# Patient Record
Sex: Male | Born: 1950 | Race: White | Hispanic: No | Marital: Married | Smoking: Former smoker
Health system: Southern US, Community
[De-identification: ages and names within clinical notes are randomized; demographics above are authoritative.]

## PROBLEM LIST (undated history)

## (undated) DIAGNOSIS — Z8679 Personal history of other diseases of the circulatory system: Secondary | ICD-10-CM

## (undated) DIAGNOSIS — G4733 Obstructive sleep apnea (adult) (pediatric): Secondary | ICD-10-CM

## (undated) DIAGNOSIS — C801 Malignant (primary) neoplasm, unspecified: Secondary | ICD-10-CM

## (undated) DIAGNOSIS — Z8249 Family history of ischemic heart disease and other diseases of the circulatory system: Secondary | ICD-10-CM

## (undated) DIAGNOSIS — Z972 Presence of dental prosthetic device (complete) (partial): Secondary | ICD-10-CM

## (undated) DIAGNOSIS — T4145XA Adverse effect of unspecified anesthetic, initial encounter: Secondary | ICD-10-CM

## (undated) DIAGNOSIS — T8859XA Other complications of anesthesia, initial encounter: Secondary | ICD-10-CM

## (undated) DIAGNOSIS — K219 Gastro-esophageal reflux disease without esophagitis: Secondary | ICD-10-CM

## (undated) DIAGNOSIS — I480 Paroxysmal atrial fibrillation: Secondary | ICD-10-CM

## (undated) DIAGNOSIS — M1990 Unspecified osteoarthritis, unspecified site: Secondary | ICD-10-CM

## (undated) DIAGNOSIS — I2510 Atherosclerotic heart disease of native coronary artery without angina pectoris: Secondary | ICD-10-CM

## (undated) DIAGNOSIS — Z974 Presence of external hearing-aid: Secondary | ICD-10-CM

## (undated) DIAGNOSIS — R55 Syncope and collapse: Secondary | ICD-10-CM

## (undated) DIAGNOSIS — I8393 Asymptomatic varicose veins of bilateral lower extremities: Secondary | ICD-10-CM

## (undated) DIAGNOSIS — Z9884 Bariatric surgery status: Secondary | ICD-10-CM

## (undated) HISTORY — PX: ELBOW SURGERY: SHX618

## (undated) HISTORY — DX: Family history of ischemic heart disease and other diseases of the circulatory system: Z82.49

## (undated) HISTORY — DX: Syncope and collapse: R55

## (undated) HISTORY — DX: Gastro-esophageal reflux disease without esophagitis: K21.9

## (undated) HISTORY — DX: Asymptomatic varicose veins of bilateral lower extremities: I83.93

## (undated) HISTORY — PX: HERNIA REPAIR: SHX51

## (undated) HISTORY — DX: Paroxysmal atrial fibrillation: I48.0

## (undated) HISTORY — PX: LAPAROSCOPIC GASTRIC SLEEVE RESECTION: SHX5895

## (undated) HISTORY — DX: Personal history of other diseases of the circulatory system: Z86.79

## (undated) HISTORY — PX: CHOLECYSTECTOMY: SHX55

## (undated) HISTORY — PX: ROTATOR CUFF REPAIR: SHX139

## (undated) HISTORY — DX: Atherosclerotic heart disease of native coronary artery without angina pectoris: I25.10

## (undated) HISTORY — DX: Bariatric surgery status: Z98.84

## (undated) HISTORY — DX: Obstructive sleep apnea (adult) (pediatric): G47.33

## (undated) HISTORY — PX: OTHER SURGICAL HISTORY: SHX169

---

## 2000-11-23 DIAGNOSIS — I480 Paroxysmal atrial fibrillation: Secondary | ICD-10-CM

## 2000-11-23 HISTORY — DX: Paroxysmal atrial fibrillation: I48.0

## 2006-11-23 HISTORY — PX: CARDIAC CATHETERIZATION: SHX172

## 2007-05-24 DIAGNOSIS — Z8679 Personal history of other diseases of the circulatory system: Secondary | ICD-10-CM | POA: Insufficient documentation

## 2007-05-24 DIAGNOSIS — I2510 Atherosclerotic heart disease of native coronary artery without angina pectoris: Secondary | ICD-10-CM

## 2007-05-24 HISTORY — DX: Personal history of other diseases of the circulatory system: Z86.79

## 2007-05-24 HISTORY — DX: Atherosclerotic heart disease of native coronary artery without angina pectoris: I25.10

## 2011-02-12 ENCOUNTER — Observation Stay: Payer: Self-pay | Admitting: Internal Medicine

## 2011-02-12 DIAGNOSIS — R079 Chest pain, unspecified: Secondary | ICD-10-CM

## 2012-04-11 DIAGNOSIS — E291 Testicular hypofunction: Secondary | ICD-10-CM | POA: Insufficient documentation

## 2012-11-17 DIAGNOSIS — I872 Venous insufficiency (chronic) (peripheral): Secondary | ICD-10-CM | POA: Insufficient documentation

## 2013-03-09 ENCOUNTER — Ambulatory Visit: Payer: Self-pay | Admitting: Gastroenterology

## 2013-03-10 DIAGNOSIS — N401 Enlarged prostate with lower urinary tract symptoms: Secondary | ICD-10-CM | POA: Insufficient documentation

## 2013-05-24 DIAGNOSIS — M47816 Spondylosis without myelopathy or radiculopathy, lumbar region: Secondary | ICD-10-CM | POA: Insufficient documentation

## 2013-05-30 ENCOUNTER — Encounter: Payer: Self-pay | Admitting: Orthopedic Surgery

## 2014-03-23 DIAGNOSIS — R55 Syncope and collapse: Secondary | ICD-10-CM

## 2014-03-23 HISTORY — DX: Syncope and collapse: R55

## 2014-03-23 HISTORY — PX: OTHER SURGICAL HISTORY: SHX169

## 2014-03-23 HISTORY — PX: TRANSTHORACIC ECHOCARDIOGRAM: SHX275

## 2014-04-06 DIAGNOSIS — R002 Palpitations: Secondary | ICD-10-CM | POA: Insufficient documentation

## 2014-04-26 DIAGNOSIS — H903 Sensorineural hearing loss, bilateral: Secondary | ICD-10-CM | POA: Insufficient documentation

## 2014-04-26 DIAGNOSIS — H905 Unspecified sensorineural hearing loss: Secondary | ICD-10-CM | POA: Insufficient documentation

## 2014-06-26 ENCOUNTER — Inpatient Hospital Stay: Payer: Self-pay | Admitting: Internal Medicine

## 2014-06-26 LAB — COMPREHENSIVE METABOLIC PANEL
AST: 29 U/L (ref 15–37)
Albumin: 3.5 g/dL (ref 3.4–5.0)
Alkaline Phosphatase: 101 U/L
Anion Gap: 9 (ref 7–16)
BILIRUBIN TOTAL: 0.3 mg/dL (ref 0.2–1.0)
BUN: 28 mg/dL — AB (ref 7–18)
CALCIUM: 8.6 mg/dL (ref 8.5–10.1)
Chloride: 107 mmol/L (ref 98–107)
Co2: 25 mmol/L (ref 21–32)
Creatinine: 0.97 mg/dL (ref 0.60–1.30)
EGFR (Non-African Amer.): >60
GLUCOSE: 104 mg/dL — AB (ref 65–99)
Osmolality: 287 (ref 275–301)
POTASSIUM: 4.1 mmol/L (ref 3.5–5.1)
SGPT (ALT): 36 U/L
Sodium: 141 mmol/L (ref 136–145)
Total Protein: 7.2 g/dL (ref 6.4–8.2)

## 2014-06-26 LAB — URINALYSIS, COMPLETE
Bacteria: NONE SEEN
Bilirubin,UR: NEGATIVE
Blood: NEGATIVE
Glucose,UR: NEGATIVE mg/dL (ref 0–75)
Ketone: NEGATIVE
Leukocyte Esterase: NEGATIVE
NITRITE: NEGATIVE
PH: 6 (ref 4.5–8.0)
Protein: NEGATIVE
RBC, UR: NONE SEEN /HPF (ref 0–5)
SQUAMOUS EPITHELIAL: NONE SEEN
Specific Gravity: 1.016 (ref 1.003–1.030)
WBC UR: NONE SEEN /HPF (ref 0–5)

## 2014-06-26 LAB — CBC WITH DIFFERENTIAL/PLATELET
BASOS ABS: 0.1 10*3/uL (ref 0.0–0.1)
BASOS PCT: 0.8 %
EOS ABS: 0.1 10*3/uL (ref 0.0–0.7)
EOS PCT: 1.2 %
HCT: 44.7 % (ref 40.0–52.0)
HGB: 14.9 g/dL (ref 13.0–18.0)
LYMPHS ABS: 1.6 10*3/uL (ref 1.0–3.6)
Lymphocyte %: 21 %
MCH: 30 pg (ref 26.0–34.0)
MCHC: 33.3 g/dL (ref 32.0–36.0)
MCV: 90 fL (ref 80–100)
MONO ABS: 0.9 x10 3/mm (ref 0.2–1.0)
MONOS PCT: 11.5 %
NEUTROS ABS: 5 10*3/uL (ref 1.4–6.5)
NEUTROS PCT: 65.5 %
Platelet: 251 10*3/uL (ref 150–440)
RBC: 4.98 10*6/uL (ref 4.40–5.90)
RDW: 14.1 % (ref 11.5–14.5)
WBC: 7.7 10*3/uL (ref 3.8–10.6)

## 2014-06-26 LAB — PROTIME-INR
INR: 1
PROTHROMBIN TIME: 12.8 s (ref 11.5–14.7)

## 2014-06-26 LAB — DRUG SCREEN, URINE

## 2014-06-26 LAB — CK-MB: CK-MB: 3.6 ng/mL (ref 0.5–3.6)

## 2014-06-26 LAB — MAGNESIUM: MAGNESIUM: 1.8 mg/dL

## 2014-06-26 LAB — APTT: Activated PTT: 38.7 secs — ABNORMAL HIGH (ref 23.6–35.9)

## 2014-06-26 LAB — CK TOTAL AND CKMB (NOT AT ARMC)
CK, Total: 294 U/L
CK-MB: 4.8 ng/mL — AB (ref 0.5–3.6)

## 2014-06-26 LAB — TROPONIN I
Troponin-I: <0.02 ng/mL
Troponin-I: <0.02 ng/mL

## 2014-06-26 LAB — ETHANOL: Ethanol %: <0.003 % (ref 0.000–0.080)

## 2014-06-26 LAB — LIPASE, BLOOD: Lipase: 271 U/L (ref 73–393)

## 2014-06-26 LAB — TSH: Thyroid Stimulating Horm: 2.38 u[IU]/mL

## 2014-06-27 ENCOUNTER — Other Ambulatory Visit: Payer: Self-pay | Admitting: Nurse Practitioner

## 2014-06-27 DIAGNOSIS — R079 Chest pain, unspecified: Secondary | ICD-10-CM

## 2014-06-27 DIAGNOSIS — I4891 Unspecified atrial fibrillation: Secondary | ICD-10-CM

## 2014-06-27 LAB — LIPID PANEL
Cholesterol: 123 mg/dL (ref 0–200)
HDL: 42 mg/dL (ref 40–60)
LDL CHOLESTEROL, CALC: 56 mg/dL (ref 0–100)
Triglycerides: 126 mg/dL (ref 0–200)
VLDL CHOLESTEROL, CALC: 25 mg/dL (ref 5–40)

## 2014-06-27 LAB — CK-MB: CK-MB: 3 ng/mL (ref 0.5–3.6)

## 2014-06-27 LAB — TROPONIN I

## 2014-06-28 DIAGNOSIS — I4891 Unspecified atrial fibrillation: Secondary | ICD-10-CM

## 2014-06-28 DIAGNOSIS — R079 Chest pain, unspecified: Secondary | ICD-10-CM

## 2014-06-28 HISTORY — PX: NM MYOVIEW LTD: HXRAD82

## 2014-06-28 LAB — BASIC METABOLIC PANEL
Anion Gap: 5 — ABNORMAL LOW (ref 7–16)
BUN: 19 mg/dL — ABNORMAL HIGH (ref 7–18)
Calcium, Total: 8.8 mg/dL (ref 8.5–10.1)
Chloride: 106 mmol/L (ref 98–107)
Co2: 29 mmol/L (ref 21–32)
Creatinine: 0.98 mg/dL (ref 0.60–1.30)
EGFR (African American): >60
EGFR (Non-African Amer.): >60
Glucose: 91 mg/dL (ref 65–99)
Osmolality: 281 (ref 275–301)
Potassium: 4.3 mmol/L (ref 3.5–5.1)
Sodium: 140 mmol/L (ref 136–145)

## 2014-06-28 LAB — MAGNESIUM: MAGNESIUM: 1.6 mg/dL — AB

## 2014-07-03 ENCOUNTER — Telehealth: Payer: Self-pay | Admitting: *Deleted

## 2014-07-03 ENCOUNTER — Encounter: Payer: Self-pay | Admitting: *Deleted

## 2014-07-03 ENCOUNTER — Telehealth: Payer: Self-pay

## 2014-07-03 NOTE — Telephone Encounter (Signed)
Patient contacted regarding discharge from [facility] on 07/02/14.  Patient understands to follow up with provider Ellyn Hack on 07/11/14 at 2:15pm at [facility]. Patient understands discharge instructions? yes Patient understands medications and regiment? yes Patient understands to bring all medications to this visit? yes  Reviewed meds

## 2014-07-03 NOTE — Telephone Encounter (Signed)
Patient contacted regarding discharge from [facility] on 06/29/14.  Patient understands to follow up with  on 07/11/14 at 07/11/14 at [facility]. Patient understands discharge instructions? Yes  Patient understands medications and regiment? yes Patient understands to bring all medications to this visit? yes

## 2014-07-11 ENCOUNTER — Encounter: Payer: Self-pay | Admitting: Cardiology

## 2014-07-11 ENCOUNTER — Encounter (INDEPENDENT_AMBULATORY_CARE_PROVIDER_SITE_OTHER): Payer: Self-pay

## 2014-07-11 ENCOUNTER — Ambulatory Visit (INDEPENDENT_AMBULATORY_CARE_PROVIDER_SITE_OTHER): Payer: BC Managed Care – PPO | Admitting: Cardiology

## 2014-07-11 VITALS — BP 120/80 | HR 50 | Ht 77.0 in | Wt 257.2 lb

## 2014-07-11 DIAGNOSIS — I4891 Unspecified atrial fibrillation: Secondary | ICD-10-CM

## 2014-07-11 DIAGNOSIS — I480 Paroxysmal atrial fibrillation: Secondary | ICD-10-CM

## 2014-07-11 DIAGNOSIS — I83893 Varicose veins of bilateral lower extremities with other complications: Secondary | ICD-10-CM

## 2014-07-11 DIAGNOSIS — R5381 Other malaise: Secondary | ICD-10-CM

## 2014-07-11 DIAGNOSIS — R5383 Other fatigue: Secondary | ICD-10-CM

## 2014-07-11 MED ORDER — APIXABAN 5 MG PO TABS: ORAL_TABLET | ORAL | Status: DC

## 2014-07-11 MED ORDER — METOPROLOL TARTRATE 25 MG PO TABS: 12.5000 mg | ORAL_TABLET | Freq: Two times a day (BID) | ORAL | Status: DC

## 2014-07-11 NOTE — Patient Instructions (Signed)
Your physician has recommended you make the following change in your medication:  Decrease Metoprolol to 12.5 mg twice daily   If you are in Afib take the full 25 mg tablet of Metoprolol with your Flecainide as ordered  Take Eliquis for 30 days for recurrent episodes of Afib   Your physician recommends that you schedule a follow-up appointment in:  4 months with Dr. Ellyn Hack

## 2014-07-15 ENCOUNTER — Encounter: Payer: Self-pay | Admitting: Cardiology

## 2014-07-15 DIAGNOSIS — R5383 Other fatigue: Secondary | ICD-10-CM | POA: Insufficient documentation

## 2014-07-15 DIAGNOSIS — I4891 Unspecified atrial fibrillation: Secondary | ICD-10-CM | POA: Insufficient documentation

## 2014-07-15 DIAGNOSIS — I83899 Varicose veins of unspecified lower extremities with other complications: Secondary | ICD-10-CM | POA: Insufficient documentation

## 2014-07-15 DIAGNOSIS — I480 Paroxysmal atrial fibrillation: Secondary | ICD-10-CM | POA: Insufficient documentation

## 2014-07-15 NOTE — Progress Notes (Signed)
PATIENT: Nicholas Reynolds MRN: [id number] DOB: 1951/05/03 PCP: Valera Castle, MD  Clinic Note: Chief Complaint  Patient presents with  . OTHER    F/u [facility] due to afib and chest pressure. Meds reviewed verbally with pt.    HPI: Nicholas Reynolds is a 63 y.o. 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 Fib.  He was admitted to [facility] on August 4 presenting with acute onset chest pain and dyspnea with associated palpitations and rapid heartbeat. The chest pain was 6-7/10 and nonradiating but associated with dyspnea and diaphoresis and a sense of pending doom. He was noted to be in A. fib with RVR rates in the 140s to treat initially with diltiazem with improved heart rate in the 80s. He remained still relatively symptomatic despite having rate control with diltiazem. He had previously been seen by a [facility] cardiologists in the [facility] area, but upon presentation here was seen by me in cardiology consult for his A. Fib. He was controlled with calcitriol blocker and had a stress test which did not reveal any ischemia. After that he was successfully chemically cardioverted with 200 mg flecainide, and discharged on low-dose beta blocker with when necessary tonight per my recommendations. He has also been on ELIQUIS with the plan to continue for at least one month status post cardioversion. He has chosen to now followup with our practice and it seems to be more local.  Interval History: Nicholas Reynolds is doing fairly well since his discharge. The main thing he notes is occasional orthostatic dizziness. Does not have any further episodes of rapid and/or irregular heart rates. He is wanting to know when he can get back to exercise. He is in the process of undergoing reevaluation with a sleep study to see how he stands for OSA goes bowling his weight  loss post GOP. He denies any after exertion. No PND orthopnea. He does have mild edema with chronic verrucous veins. Telemetry he had veins tripping of what sounds like the lesser saphenous vein on the left leg but no improvement on that from that standpoint. No melena, hematochezia, hematuria or epistaxis. No syncope/near syncope, TIA/amaurosis fugax. No claudication.  Past Medical History  Diagnosis Date  . Paroxysmal atrial fibrillation 2006    a) Initially diagnosed as Lone A Fib while in [place], [place] - was on warfarin for a couple years and a Flecainide Pill-in Pocket PRN; b) recurrent A. fib with RVR August 2050 - status post cardioversion with flecainide, on low-dose beta blocker  . GERD (gastroesophageal reflux disease)   . Obstructive sleep apnea     Upcoming sleep study  . H/O ventricular tachycardia     Status post ablation -   . Family history of premature coronary artery disease     Father  . History of gastric restrictive surgery December 2040    Formerly morbidly obese; gastric sleeve  . Asymptomatic varicose veins of bilateral lower extremities     Uncomfortable but in no ulcers  . Coronary artery disease, non-occlusive     Nonobstructive by cath in 2000  . Pre-syncope May 2015    Unrevealing monitor in May    Prior Cardiac Evaluation and Past Surgical History: Past Surgical History  Procedure Laterality Date  . Laparoscopic gastric sleeve resection      December 20 14th  . Ventricular tachycardia ablation    .  Transthoracic echocardiogram  May 2015    [facility] - Dr. Sharyn Lull: EF 55%, mild LVH; otherwise mostly normal  . Cardiac catheterization  2008    Nonobstructive CAD  . Event monitor  May 2015    Unrevealing  . Nm myoview ltd  June 28, 2014    [facility]: [facility] -- no evidence of ischemia or infarction. Normal EF ~58%    No Known Allergies  Current Outpatient Prescriptions  Medication Sig Dispense Refill  . acetaminophen (TYLENOL) 500 MG  tablet Take 1,000 mg by mouth 2 (two) times daily.      Marland Kitchen apixaban (ELIQUIS) 5 MG TABS tablet Take one 5 mg tablet twice daily as needed for 30 days as needed for recurrent afib  60 tablet  2  . cholecalciferol (VITAMIN D) 1000 UNITS tablet Take 1,000 Units by mouth daily.      . flecainide (TAMBOCOR) 100 MG tablet Take 200 mg by mouth as needed.      . metoprolol tartrate (LOPRESSOR) 25 MG tablet Take 0.5 tablets (12.5 mg total) by mouth 2 (two) times daily.  90 tablet  3  . Multiple Vitamin (MULTIVITAMIN) tablet Take 1 tablet by mouth daily.      . NON FORMULARY Omega 6 500mg Takes 1 tablets daily.      Marland Kitchen omega-3 acid ethyl esters (LOVAZA) 1 G capsule Take 1 g by mouth daily.      Marland Kitchen omeprazole (PRILOSEC) 20 MG capsule Take 20 mg by mouth 2 (two) times daily before a meal.       No current facility-administered medications for this visit.    History   Social History Narrative   Former smoker who quit over 15 years ago and does not drink alcohol or use drugs.   He is married with 2 children. Lives in [place], [place].  Works as an Chief Financial Officer for [employer].    family history includes Arrhythmia in his father; COPD in his father; Heart attack in his father; Heart failure in his father.  ROS: A comprehensive Review of Systems - was performed Review of Systems  Constitutional: Positive for malaise/fatigue. Negative for fever, chills and diaphoresis.       Still feels a bit tired from his hospitalization but is doing more and more each day.  HENT: Negative for nosebleeds.   Eyes: Negative.   Respiratory: Negative for cough, hemoptysis, sputum production, shortness of breath and wheezing.   Cardiovascular: Positive for leg swelling.       With the exception of mild orthostatic dizziness & chronic LE Edema / Varicose veins that are tender. No ulcers.  Gastrointestinal: Negative for heartburn, blood in stool and melena.  Genitourinary: Negative for hematuria.  Skin: Negative.   Neurological: Positive for  dizziness. Negative for tremors, sensory change, speech change, focal weakness, seizures, loss of consciousness and weakness.  Endo/Heme/Allergies: Does not bruise/bleed easily.  All other systems reviewed and are negative.   PHYSICAL EXAM BP 120/80  Pulse 50  Ht 6\' 5"  (1.956 m)  Wt 257 lb 4 oz (116.688 kg)  BMI 30.50 kg/m2 Physical Exam  Constitutional: He appears well-developed and well-nourished. He appears distressed.  HENT:  Head: Normocephalic and atraumatic.  Mouth/Throat: Oropharynx is clear and moist.  Eyes: Conjunctivae and EOM are normal. Pupils are equal, round, and reactive to light. No scleral icterus.  Neck: Normal range of motion. Neck supple. No JVD present. No tracheal deviation present. Thyromegaly present.  Cardiovascular: Regular rhythm, S1 normal and S2 normal.  No extrasystoles are present. Bradycardia present.  PMI is not displaced.  Exam reveals no gallop.   No murmur heard. Bilateral varicose veis from just above the knee to ankles - prominent with mild (& according to patient, much less pronounced since Bariatric Sgx)  Pulmonary/Chest: Effort normal and breath sounds normal. No stridor. No respiratory distress. He has no wheezes. He has no rales. He exhibits no tenderness.  Abdominal: Soft. Bowel sounds are normal. He exhibits no distension. There is no tenderness. There is no rebound and no guarding.  Genitourinary:  deferred  Musculoskeletal: Normal range of motion. He exhibits edema.  mild  Lymphadenopathy:    He has no cervical adenopathy.  Skin: Skin is warm, dry and intact. No cyanosis. Nails show no clubbing.     Psychiatric: He has a normal mood and affect. His speech is normal and behavior is normal. Judgment and thought content normal. His mood appears not anxious. Cognition and memory are normal. He does not exhibit a depressed mood.     Adult ECG Report  Rate: 50 ;  Rhythm: sinus bradycardia - otherwise normal axis, voltage,  durations  Recent Labs: From [facility] August 4 and 5  Total cholesterol 123, triglycerides 126, HDL 42, LDL 56  BMP: Sodium 140, potassium 43, chloride 106, bicarbonate 29, BUN 19, creatinine 0.98 (GFR is greater than 60), glucose 91, calcium 8.8, magnesium 1.6.  ASSESSMENT / PLAN: Relatively stable status post chemical cardioversion and no bleeding complications on ELIQUIS.  Paroxysmal atrial fibrillation It would seem that he has had some mild recurrences leading up to this last admission.  He did cardiovert with flecainide.  Plan:   Continue ELIQUIS to complete one month post cardioversion, but can then discontinue as his CHA2DS2Vasc score is otherwise 0. - No CAD, CHF, diabetes or peripheral vascular disease and no stroke. He is less than 65.  Reduce metoprolol to 12.5 mg twice a day  Continue when necessary flecainide as "pill in the pocket"for cardioversion: He would take dose of metoprolol +200 mg flecainide, if not converted after 6 hours take a second dose of flecainide  If when necessary flecainide is used for cardioversion, I would then recommend he goes back on ELIQUIS for a month -- Will provide when necessary prescription.  Varicose veins of lower extremity with edema At this point, he's not overly symptomatic and not related consider an invasive procedures. Plan: Medium weight compression stockings and foot elevation.  Fatigue Not really sure what is causing this. I think he probably has some deconditioning, and having had couple days in the hospital without activity he is not bouncing back as quickly as expected. Also be in part due to beta blockade, will decrease to 12.5 mg twice a day.    Orders Placed This Encounter  Procedures  . EKG 12-Lead    Order Specific Question:  Where should this test be performed    Answer:  [facility]   Meds ordered this encounter  Medications  . cholecalciferol (VITAMIN D) 1000 UNITS tablet    Sig: Take 1,000 Units by mouth  daily.  Marland Kitchen omega-3 acid ethyl esters (LOVAZA) 1 G capsule    Sig: Take 1 g by mouth daily.  . NON FORMULARY    Sig: Omega 6 500mg Takes 1 tablets daily.  . metoprolol tartrate (LOPRESSOR) 25 MG tablet    Sig: Take 0.5 tablets (12.5 mg total) by mouth 2 (two) times daily.    Dispense:  90 tablet    Refill:  3  .  apixaban (ELIQUIS) 5 MG TABS tablet    Sig: Take one 5 mg tablet twice daily as needed for 30 days as needed for recurrent afib    Dispense:  60 tablet    Refill:  2    Followup: 4 months  Claus Silvestro W. Ellyn Hack, M.D., M.S. Interventional Cardiolgy [facility]

## 2014-07-15 NOTE — Assessment & Plan Note (Addendum)
It would seem that he has had some mild recurrences leading up to this last admission.  He did cardiovert with flecainide.  Plan:   Continue ELIQUIS to complete one month post cardioversion, but can then discontinue as his CHA2DS2Vasc score is otherwise 0. - No CAD, CHF, diabetes or peripheral vascular disease and no stroke. He is less than 65.  Reduce metoprolol to 12.5 mg twice a day  Continue when necessary flecainide as "pill in the pocket"for cardioversion: He would take dose of metoprolol +200 mg flecainide, if not converted after 6 hours take a second dose of flecainide  If when necessary flecainide is used for cardioversion, I would then recommend he goes back on ELIQUIS for a month -- Will provide when necessary prescription.

## 2014-07-15 NOTE — Assessment & Plan Note (Signed)
Not really sure what is causing this. I think he probably has some deconditioning, and having had couple days in the hospital without activity he is not bouncing back as quickly as expected. Also be in part due to beta blockade, will decrease to 12.5 mg twice a day.

## 2014-07-15 NOTE — Assessment & Plan Note (Signed)
At this point, he's not overly symptomatic and not related consider an invasive procedures. Plan: Medium weight compression stockings and foot elevation.

## 2014-09-06 DIAGNOSIS — M65312 Trigger thumb, left thumb: Secondary | ICD-10-CM | POA: Insufficient documentation

## 2014-09-27 ENCOUNTER — Telehealth: Payer: Self-pay | Admitting: *Deleted

## 2014-09-27 NOTE — Telephone Encounter (Signed)
Patient called and he dizzy, tired and hbp 146/80.

## 2014-09-28 ENCOUNTER — Encounter: Payer: Self-pay | Admitting: Cardiology

## 2014-09-28 ENCOUNTER — Other Ambulatory Visit: Payer: Self-pay | Admitting: *Deleted

## 2014-09-28 MED ORDER — METOPROLOL TARTRATE 25 MG PO TABS: 12.5000 mg | ORAL_TABLET | Freq: Two times a day (BID) | ORAL | Status: DC

## 2014-09-28 NOTE — Telephone Encounter (Signed)
[place] for metoprolol sent in

## 2014-09-28 NOTE — Telephone Encounter (Signed)
I agree with Nicholas Reynolds.  That is not a bad blood pressure when he is not feeling well. [place]

## 2014-09-28 NOTE — Telephone Encounter (Signed)
Patient called and stated his blood pressure during his MD appt yesterday was 146/80 It returned to normal once he returned home  Patient stated his only concern was that he has been feeling a little tired and dizzy the past few days  He has had a possible "GI bug" and has had diarrhea the past few days  He also stated that he noticed lately that if he stares at the wall or computer screen he thinks he can "see is heartbeat in his eyes"  I informed patient that it is not abnormal to have mildly elevated bo during a doctor visit  As long as it returns to normal and does not sustain  I told him the feeling dizzy and tired could be related to his GI upset, make sure he is staying hydrated and monitor his symptoms  I explained that "seeing his heartbeat" could just be from focusing on it continue to monitor and call if there are any patients  I informed patient that I would inform Dr. Ellyn Hack of his symptoms  Continue to monitor over the weekend  Contact EMS if he feels his situation becomes emergent

## 2014-10-01 NOTE — Telephone Encounter (Signed)
See phone note

## 2014-11-02 ENCOUNTER — Telehealth: Payer: Self-pay | Admitting: Internal Medicine

## 2014-11-02 NOTE — Telephone Encounter (Signed)
He had an episode of AF and reverted with flecainide. Wanted to know when to start his eliquis. i told him to take it now and followup w/ Dr. Ellyn Hack on Monday.  Raliegh Ip, MD MPH

## 2014-11-05 ENCOUNTER — Encounter: Payer: Self-pay | Admitting: Cardiology

## 2014-11-22 ENCOUNTER — Telehealth: Payer: Self-pay | Admitting: Internal Medicine

## 2014-11-22 ENCOUNTER — Emergency Department: Payer: Self-pay | Admitting: Emergency Medicine

## 2014-11-22 NOTE — Telephone Encounter (Signed)
As an on-call cardiology fellow, I received a call from Nicholas Reynolds today at 10:30 PM. Patient reportedly was doing fairly well until this evening when his wife thought that he looked pale. He checked his blood pressure and noted to be elevated at 230s/130s. Patient denies any headaches, nausea, chest pain, vomiting, blurry vision or shortness of breath. I recommended the patient to go to a local emergency department for further evaluation. Of note, he denies any excessive salt intake, new medications or recent stress.

## 2014-12-05 ENCOUNTER — Ambulatory Visit (INDEPENDENT_AMBULATORY_CARE_PROVIDER_SITE_OTHER): Payer: BLUE CROSS/BLUE SHIELD | Admitting: Cardiology

## 2014-12-05 ENCOUNTER — Encounter: Payer: Self-pay | Admitting: Cardiology

## 2014-12-05 VITALS — BP 106/64 | HR 62 | Ht 77.0 in | Wt 268.5 lb

## 2014-12-05 DIAGNOSIS — E669 Obesity, unspecified: Secondary | ICD-10-CM

## 2014-12-05 DIAGNOSIS — I480 Paroxysmal atrial fibrillation: Secondary | ICD-10-CM

## 2014-12-05 DIAGNOSIS — R42 Dizziness and giddiness: Secondary | ICD-10-CM

## 2014-12-05 MED ORDER — METOPROLOL TARTRATE 25 MG PO TABS: ORAL_TABLET | ORAL | Status: DC

## 2014-12-05 NOTE — Progress Notes (Signed)
PATIENT: Nicholas Reynolds MRN: [id number] DOB: 1951-04-06 PCP: Valera Castle, MD  Clinic Note: Chief Complaint  Patient presents with  . other    4 month f/u c/o lightheadedness when going from sitting to standing. Meds reviewed verbally with pt.  . Atrial Fibrillation    HPI: Nicholas Reynolds is a 64 y.o. 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 Fib. I met him in August 2015, when he was admitted to [facility] with chest pain dyspnea and at a heart rate. He is not in A. Fib RVR with rates in the 140s. Symptoms improved with reduced heart rate but he still felt quite symptomatic while in A. Fib. He was started on a beta blocker and chemically cardioverted with flecainide after a nuclear stress test did not reveal any ischemia, and echocardiogram was normal.  After that he was successfully chemically cardioverted with 200 mg flecainide, and discharged on low-dose beta blocker with when necessary tonight per my recommendations. He has also been on ELIQUIS with the plan to continue for at least one month status post cardioversion. He has chosen to now followup with our practice and it seems to be more local.  Interval History: Nicholas Reynolds has been doing very well. About a month ago he did have an episode that was consistent with an A. Fib episode. He followed our when necessary protocol and noted relief of symptoms within a few hours. He just completed his one month of Eliquis, and had no bleeding problems.  He continues to note orthostatic dizziness, but says it is more frequent and normal persistent but every time he stands up. It is short lived and he does not had a near syncopal type symptoms with it. With the exception of the one episode, he has not had any further episodes of rapid and/or irregular heart rates.  He is now back doing his  routine exercise. But has fallen back with his diet and intensity of exercise. He denies any chest tightness, pressure or dyspnea with rest or exertion. No PND, orthopnea. He does some mild edema with chronic varicose veins. No syncope/near syncope, TIA/amaurosis fugax. No claudication.  Past Medical History  Diagnosis Date  . Paroxysmal atrial fibrillation 2002    a) Initially diagnosed as Lone A Fib while in [place] (on ASA & BB after) - was on warfarin for a couple years and a Flecainide Pill-in Pocket PRN; b) recurrent A. fib with RVR August 2050 - status post cardioversion with flecainide, on low-dose beta blocker  . GERD (gastroesophageal reflux disease)   . Obstructive sleep apnea     Upcoming sleep study  . 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 Family history of premature coronary artery disease     Father  . History of gastric restrictive surgery December 2040    Formerly morbidly obese; gastric sleeve  . Asymptomatic varicose veins of bilateral lower extremities     Uncomfortable but in no ulcers  . Coronary artery disease, non-occlusive July 2008    Nonobstructive by cath   . Pre-syncope May 2015    Unrevealing monitor in May    Prior Cardiac Evaluation and Past Surgical History: Past Surgical History  Procedure Laterality Date  . Laparoscopic gastric sleeve resection      December 20 14th  . Radiofrequency ablation of ventricular tachycardia  July 2000 the    RVOT VT ablation: [facility], [place], [place]  . Transthoracic echocardiogram  May 2015    [facility] - Dr. Sharyn Lull: EF 55%, mild LVH; otherwise mostly normal  . Cardiac catheterization  2008    Nonobstructive CAD  . Event monitor  May 2015    Unrevealing  . Nm myoview ltd  June 28, 2014    [facility]: [facility] -- no evidence of ischemia or infarction. Normal EF ~58%  . Rotator cuff repair    . Cholecystectomy    . Hernia repair    . Elbow surgery      No Known Allergies  Current Outpatient Prescriptions  Medication Sig Dispense Refill  . acetaminophen (TYLENOL) 500 MG tablet Take 1,000 mg by mouth 2 (two) times daily.    . cholecalciferol (VITAMIN D) 1000 UNITS tablet Take 1,000 Units by mouth daily.    . flecainide (TAMBOCOR) 100 MG tablet Take 200 mg by mouth as needed.    . metoprolol tartrate (LOPRESSOR) 25 MG tablet Take one 25 mg tablet when you have an afib episode followed by taking 12.5 mg (1/2 tablet) twice daily for 2 days. 30 tablet 3  . Multiple Vitamin (MULTIVITAMIN) tablet Take 1 tablet by mouth daily.    . NON FORMULARY Omega 6 56mTakes 1 tablets daily.    .Marland Kitchenomega-3 acid ethyl esters (LOVAZA) 1 G capsule Take 1 g by mouth daily.    .Marland Kitchenomeprazole (PRILOSEC) 20 MG capsule Take 20 mg by mouth 2 (two) times daily before a meal.     No current facility-administered medications for this visit.    History   Social History Narrative   Former smoker who quit over 15 years ago and does not drink alcohol or use drugs.   He is married with 2 children. 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  ROS: A comprehensive Review of Systems - was performed Review of Systems  Constitutional: Negative for weight loss (Weight gain) and malaise/fatigue.  HENT: Negative for nosebleeds.   Respiratory: Negative for cough.   Cardiovascular: Positive for palpitations and leg swelling (Mild).  Gastrointestinal: Negative for blood in stool and melena.  Genitourinary: Negative for hematuria.  Musculoskeletal: Negative for myalgias.  Neurological: Positive for dizziness (Positional, when first standing).  Endo/Heme/Allergies:  Does not bruise/bleed easily.  Psychiatric/Behavioral: The patient is nervous/anxious.   All other systems reviewed and are negative.   Physical Exam  Skin: Skin is warm, dry and intact.  varicosities - lower leg(s) bilateral and staisis dermatitis   BP 106/64 mmHg  Pulse 62  Ht 6' 5"  (1.956 m)  Wt 268 lb 8 oz (121.791 kg)  BMI 31.83 kg/m2  General appearance: alert, cooperative, appears stated age, no distress and mildly obese Neck: no adenopathy, no carotid bruit and no JVD Lungs: CTAB, normal percussion bilaterally and non-labored Heart:RRR w/ occasional Ectopy, S1& S2 normal, no murmur, click, rub or gallop ; non-displaced PMI Abdomen: soft, non-tender; bowel sounds normal; no masses,  no organomegaly; no HJR Extremities: extremities normal, atraumatic, no cyanosis, and edema trace Pulses: 2+ and symmetric;   Neurologic: Mental status: Alert, oriented, thought content appropriate Cranial  nerves: normal (II-XII grossly intact  Adult ECG Report  Rate: 62;  Rhythm: normal sinus rhythm and premature atrial contractions (PAC) - otherwise normal axis, voltage, durations  ASSESSMENT / PLAN: Overall relatively stable the cardiac standpoint. Successful one time cardioverting with flecainide pill in the pocket.  Paroxysmal atrial fibrillation Excellent results with with when necessary flecainide.  Plan:  With orthostatic dizziness, will simply stop metoprolol.  PRN for Afib Episode  Take 25 mg (1 tablet) of Metoprolol when you have an episode of afib followed by Flecainide 200 mg (2 tablet) in 1 hour after taking Metoprolol   2 days of Metoprolol 12.5 mg BID  1 month Eliquis   Orthostatic dizziness Probably volume related on with venostasis and his height. Residual effect from being morbidly obese. With his borderline blood pressures, I think we can simply stop the metoprolol, because he is also noting a little fatigue related to it.   Obesity (BMI 30-39.9) S/p GOP. The  patient understands the need to lose weight with diet and exercise. We have discussed specific strategies for this.    No orders of the defined types were placed in this encounter.   Meds ordered this encounter  Medications  . metoprolol tartrate (LOPRESSOR) 25 MG tablet    Sig: Take one 25 mg tablet when you have an afib episode followed by taking 12.5 mg (1/2 tablet) twice daily for 2 days.    Dispense:  30 tablet    Refill:  3    Followup: 6-7 months  DAVID W. Ellyn Hack, M.D., M.S. Interventional Cardiolgy [facility]

## 2014-12-05 NOTE — Assessment & Plan Note (Addendum)
S/p GOP. The patient understands the need to lose weight with diet and exercise. We have discussed specific strategies for this.

## 2014-12-05 NOTE — Assessment & Plan Note (Signed)
Probably volume related on with venostasis and his height. Residual effect from being morbidly obese. With his borderline blood pressures, I think we can simply stop the metoprolol, because he is also noting a little fatigue related to it.

## 2014-12-05 NOTE — Assessment & Plan Note (Signed)
Excellent results with with when necessary flecainide.  Plan:  With orthostatic dizziness, will simply stop metoprolol.  PRN for Afib Episode  Take 25 mg (1 tablet) of Metoprolol when you have an episode of afib followed by Flecainide 200 mg (2 tablet) in 1 hour after taking Metoprolol   2 days of Metoprolol 12.5 mg BID  1 month Eliquis

## 2014-12-05 NOTE — Patient Instructions (Addendum)
Your physician has recommended you make the following change in your medication:  Take 25 mg (1 tablet) of Metoprolol when you have an episode of afib followed by Flecainide 100 mg (1 tablet) in 1 hour after taking Metoprolol  Then for the following two days take Metoprolol 12.5 mg (1/2 tablet) twice daily   Your physician wants you to follow-up in: late July/Early August with Dr. Ellyn Hack. You will receive a reminder letter in the mail two months in advance. If you don't receive a letter, please call our office to schedule the follow-up appointment.

## 2014-12-07 ENCOUNTER — Telehealth: Payer: Self-pay

## 2014-12-07 NOTE — Telephone Encounter (Signed)
Request from [employer] , sent to [facility] on 10/30/2014.

## 2015-03-07 DIAGNOSIS — M25561 Pain in right knee: Secondary | ICD-10-CM | POA: Insufficient documentation

## 2015-03-16 NOTE — Discharge Summary (Signed)
PATIENT NAME:  Nicholas Reynolds, Nicholas Reynolds MR#:  [id number] DATE OF BIRTH:  October 18, 1951  DATE OF ADMISSION:  06/26/2014 DATE OF DISCHARGE:  06/29/2014  ADMITTING PHYSICIAN:  Nicholas Nose, MD   DISCHARGING PHYSICIAN:  Nicholas Lighter, MD   PRIMARY CARE PHYSICIAN:  Nicholas Castle, MD   CONSULTATIONS:  In the hospital, cardiology consultation by Dr. Glenetta Reynolds, and Dr. Fletcher Anon, MD   DISCHARGE DIAGNOSES: 1.  Atrial fibrillation with rapid ventricular response converted to normal sinus rhythm after pharmacologic treatment with flecainide.  2.  Gastroesophageal reflux disease.  3.  Status post gastric sleeve surgery.  4.  Obstructive sleep apnea.   DISCHARGE HOME MEDICATIONS:  1.  Prilosec 20 mg p.o. b.i.d.  2.  Tylenol Extra Strength 500 mg 2 to 3 tablets twice a day p.r.n. for pain.  3.  Multivitamin 1 tablet p.o. daily.  4.  Metoprolol 25 mg p.o. b.i.d.  5.  Eliquis 5 mg p.o. b.i.d. for 4 weeks.  6.  Flecainide 200 mg tablet once as needed for atrial fibrillation symptoms.   DISCHARGE DIET:  Regular diet.   DISCHARGE ACTIVITY:  As tolerated.   FOLLOWUP INSTRUCTIONS:  1.  Follow up with [facility] in 2 weeks.  2.  PCP follow up in 4  weeks.   LABORATORY AND IMAGING STUDIES PRIOR TO DISCHARGE:   1.  Sodium 140, potassium 4.3, chloride 106, bicarb 29, BUN 19, creatinine 0.98, glucose 91, and calcium of 8.8, magnesium 1.6; LDL cholesterol 56, HDL 42, total cholesterol 123, triglycerides 126.  2.  Troponins remain negative in the hospital course.  3.  Urinalysis negative for infection. Urine tox screen negative.  4.  Chest x-ray on admission showing no cardiopulmonary disease, clear lung fields.  5.  WBC 7.7, hemoglobin 14.9, hematocrit 44.7, platelet count is 251,000, TSH is 2.38.   BRIEF HOSPITAL COURSE:  Nicholas Reynolds is a 64 year old Caucasian male with no significant past medical history other than status post gastric sleeve surgery and reflux disease who had history of  paroxysmal atrial fibrillation and vtach ventricular tachycardia status post ablation in the past several years ago who presents with chest pain, flushing, palpitations, and noted to be in atrial fibrillation with RVR, heart rate as high as high as 170 and 180 beats per minute.  1.  Paroxysmal atrial fibrillation. His prior presentation was in 2006, when he was treated with Coumadin; however, converted back to normal sinus rhythm at that time, had a ventricular tachycardia episode in 2008, at which time he had ablation. The patient was symptomatic and brought to the ER, had chest pain and noted to be in atrial fibrillation with RVR.  He was on the floor; metoprolol, Eliquis were started; because his presentation was less than 24 hours they tried to cardiovert him; however, Lexiscan was done and it was negative for any coronary artery disease so flecainide was given and patient converted to normal sinus rhythm within 30 minutes to an hour. So he is being discharged on flecainide  200 mg, pill in pocket for symptomatic atrial fibrillation. He can follow up with cardiology. Because of cardioversion, though he was cardioverted within 48 hours, he is being discharged on Eliquis for 4 weeks after cardioversion. Again, he had pharmacological cardioversion not electric cardioversion. He is being discharged on metoprolol, low dose which was started in the hospital.  2.  Obstructive sleep apnea.  Patient has a follow-up at [facility] for outpatient sleep study.  3.  Gastroesophageal reflux disease. Continue omeprazole.  4.  His course has been otherwise uneventful in the hospital.     DISCHARGE CONDITION:  Stable.   DISCHARGE DISPOSITION:  Home.   TIME SPENT ON DISCHARGE:  Is 45 minutes.         ____________________________ Nicholas Lighter, MD rk:nt D: 06/29/2014 13:08:25 ET T: 06/29/2014 14:01:01 ET JOB#: [id number]  cc: Nicholas Lighter, MD, <Dictator> Nicholas Castle, MD Nicholas Lighter  MD ELECTRONICALLY SIGNED 07/10/2014 14:12

## 2015-03-16 NOTE — H&P (Signed)
PATIENT NAME:  OSTEN, JANEK MR#:  [id number] DATE OF BIRTH:  May 29, 1951  DATE OF ADMISSION:  06/26/2014  REFERRING PHYSICIAN: Dr. Karma Greaser.   PRIMARY CARE PHYSICIAN: Dr. Kym Groom.  CARDIOLOGY: [facility]  CHIEF COMPLAINT: Chest pain.  HISTORY OF PRESENT ILLNESS: This is a 64 year old Caucasian gentleman with a history of paroxysmal atrial fibrillation, ventricular tachycardia status post ablation presenting with chest pain. Describes acute onset of chest pain occurring at rest with associated palpitations, chest pain in retrosternal location, pressure in quality, 6 to 7 out of 10 in intensity, nonradiating, no worsening or relieving factors, associated with shortness of breath as well as nausea. In the Emergency Department noted to be in atrial fibrillation with rapid ventricular response, heart rate into the 140s, given Cardizem p.o. and heart rate has improved into the 80s. Now no further complaints.   REVIEW OF SYSTEMS:  CONSTITUTIONAL: Denies fever, chills, fatigue, weakness. EYES: Denies blurry, double vision, or eye pain.  HEENT: Denies tinnitus, ear pain, hearing loss.  RESPIRATORY: Denies cough, wheeze, shortness of breath. CARDIOVASCULAR: Positive for chest pain, palpitations as described above.  GASTROINTESTINAL: Positive for nausea as described above. Denies vomiting, diarrhea, abdominal pain.  GENITOURINARY: Denies dysuria or hematuria.  ENDOCRINE: Denies nocturia or thyroid problems.  HEMATOLOGIC AND LYMPHATIC: Denies easy bruising, bleeding. SKIN: Denies rash or lesions. MUSCULOSKELETAL: Denies pain in neck, back, shoulders, knees, hips, or arthritic symptoms.  NEUROLOGIC: He denies paralysis or paresthesias.  PSYCHIATRIC: Denies anxiety or depressive symptoms.   Otherwise, full review of systems performed by me is negative.   PAST MEDICAL HISTORY: Paroxysmal atrial fibrillation and ventricular tachycardia status post ablation.  SOCIAL HISTORY: Remote tobacco use. Denies  any alcohol or drug use.   FAMILY HISTORY: Positive for coronary artery disease.   ALLERGIES: No known drug allergies.   HOME MEDICATIONS: Include Tylenol extra strength 500 mg 2-3 tabs p.o. b.i.d. as needed for pain, Prilosec 20 mg p.o. b.i.d., multivitamin 1 tab p.o. daily.   PHYSICAL EXAMINATION:  VITAL SIGNS: Heart rate 150, currently 80, respirations 20, blood pressure 127/83 saturating 100% on room air. Weight 113.4 kg, BMI 29.7.  GENERAL: Well-nourished, well-developed, Caucasian gentleman currently in no acute distress.  HEAD: Normocephalic, atraumatic.  EYES: Pupils equal, round, reactive to light. Extraocular muscles intact. No scleral icterus.  MOUTH: Moist mucous membranes. Dentition intact. No abscess noted.  EARS, NOSE, AND THROAT: Clear without exudates. No external lesions.  NECK: Supple. No thyromegaly. No nodules. No JVD.  PULMONARY: Clear to auscultation bilaterally without wheezes, rales, or rhonchi. No use of accessory muscles. Good respiratory effort.  CHEST:  Nontender on palpation.  CARDIOVASCULAR: S1, S2, irregular rate, irregular rhythm. No murmurs, rubs, or gallops. No edema. Pedal pulses 2+ bilaterally.  GASTROINTESTINAL: Soft, nontender, nondistended. No masses. Positive bowel sounds. No hepatosplenomegaly.  MUSCULOSKELETAL: No swelling, clubbing, or edema. Range of motion is full in all extremities. NEUROLOGIC: Cranial nerves II through XII intact. No gross neurologic deficits. Sensation intact. Reflexes intact.  SKIN: No ulcerations, lesions, no rashes, or cyanosis. Skin warm and dry. Turgor intact. PSYCHIATRIC: Mood and affect within normal limits. The patient awake, alert, and oriented x 3. Insight and judgment intact.   LABORATORY DATA: EKG performed, atrial fibrillation, right ventricular response, heart rate in the 140s. Chest x-ray performed, no acute cardiopulmonary process.   Remainder of laboratory data: Sodium 141, potassium 4.1, chloride 107,  bicarbonate 25, BUN 28, creatinine 0.97, glucose 104. Troponin less than 0.02. WBC is 7.7, hemoglobin 14.9, platelets of 251,000.  ASSESSMENT AND PLAN: A 64 year old gentleman with history of paroxysmal atrial fibrillation, ventricular tachycardia status post ablation  with chest pain who presented with atrial fibrillation, rapid ventricular response.   1. chest pain: Admit to telemetry. Initiate aspirin and statin therapy. Trend cardiac enzymes x 3. Cardiology consult. However, the chest pain is likely related to atrial fibrillation with ischemic demand.  2. Atrial fibrillation with rapid ventricular response, currently rate controlled after p.o. Cardizem. goal heart rate less than 120 and will check a transthoracic echocardiogram. TSH is CHADS score zero. Will hold anticoagulation for now.  3. Gastroesophageal reflux disease. Proton pump inhibitor therapy. 4. Venous thromboembolism prophylaxis with heparin subcutaneous.   CODE STATUS: The patient is full code.   TIME SPENT: 45 minutes.    ____________________________ Aaron Mose. Hower, MD dkh:lt D: 06/26/2014 21:25:00 ET T: 06/26/2014 22:04:52 ET JOB#: [id number]  cc: Aaron Mose. Hower, MD, <Dictator> DAVID Woodfin Ganja MD ELECTRONICALLY SIGNED 06/27/2014 0:19

## 2015-03-16 NOTE — Consult Note (Signed)
General Aspect 64 y/o male with a h/o PAF and PVT s/p VT RFCA in 2008, who presented to [facility] last night 2/2 recurrent palpitations, rapid afib, and chest pain. ________________  Past Medical History  1.  Morbid Obesity      a. s/p bariatric surgery 10/2013 2.  OSA      a. Not using CPAP over past several mos.  Pending repeat sleep eval through [facility] 3.  PAF      a. First dx in 2006.      b. Was on coumadin for some period of time in the past but was d/c'd due to low CVA risk.      c. Previously was given flecainide pill in the pocket. 4.  Paroxysmal VT      a. Reportedly nl cath in [place], [place] in 2008.      b. S/P RFCA for VT in 2008. 5.  Pre-syncope      a. Weekly bouts w/ unrevealing event monitor through [place] in 03/2014.      b. 03/2014 Echo ([facility]): EF >55%, mild LVH. 6.  H/O Tob Abuse - quit ~ 2000. 7.  COPD 8.  GERD 9.  Chest Pain      a. 01/2011 Neg MV. 10. H/O Lower ext edema      a. s/p prior vein stripping. ______________   Present Illness 64 y/o male with the above complex list.  He has a h/o morbid obesity.  In 2006 he was dx with afib and was worked up in [place], [place].  He converted spontaneously and was placed on coumadin and flecainide pill-in-the-pocket for some period of time.  In 2008, he developed recurrent palpitations and was found on event monitoring to have VT.  He underwent diagnostic cath revealing reportedly nl cors and then VT ablation.  VT has been quiescent since.  Over the years, he has had intermittent, short lived palpitations and sometimes these are associated with very brief episodes of presyncope.  This may occur as often as 2-3 x/wk.  In 10/2013, he underwent bariatric surgery and in April 2015, he had a health screening @ work and was told he had an irreg heart rhythm. He was seen by Dr. Merrilee Jansky. Tobe Sos @ [facility] and wore an event monitor for 30 days.  He had at least 2 episodes of presyncope during that time but monitoring only showed sinus tach.  Echo was  done and showed nl LV fxn. No further w/u was indicated. Last evening, he was getting ready for work and had sudden onset of tachypalpitations.  This progressed to include presyncope, chest pressure, and dyspnea, prompting him to present to the [facility] ED.  There, he was found to be in afib with RVR.  He was treated with dilt 113m PO x 1 with improved rate control and was also given SQ heparin.  He remains if afib but rates are now in the 70's.  He feels much better but can still tell that he is out of rhythm.  Depsite prolonged c/p last night, his troponins are normal.   Physical Exam:  GEN well developed, well nourished, no acute distress, pleasant, nad.   HEENT pink conjunctivae, PERRL, moist oral mucosa   NECK supple  no bruits/jvd.   RESP normal resp effort  clear BS   CARD Irregular rate and rhythm  Normal, S1, S2  No murmur   ABD denies tenderness  no liver/spleen enlargement  soft  normal BS   LYMPH negative  neck   EXTR negative cyanosis/clubbing, negative edema   SKIN normal to palpation, No rashes, skin turgor good   NEURO cranial nerves intact, grossly intact, nonfocal.   PSYCH alert, A+O to time, place, person, good insight   Review of Systems:  Subjective/Chief Complaint chest pain, rapid irregular heartbeat   General: generalized malaise and wkns this AM.   Skin: No Complaints   ENT: No Complaints   Eyes: No Complaints   Neck: No Complaints   Respiratory: sob in setting of rapid afib last night.   Cardiovascular: Chest pain or discomfort  Palpitations  Dyspnea  -in setting of afib last night.   Gastrointestinal: No Complaints   Genitourinary: No Complaints   Vascular: No Complaints   Musculoskeletal: No Complaints   Neurologic: No Complaints   Hematologic: No Complaints   Endocrine: No Complaints   Psychiatric: No Complaints   Review of Systems: All other systems were reviewed and found to be negative   Medications/Allergies Reviewed  Medications/Allergies reviewed   Family & Social History:  Family and Social History:  Family History Coronary Artery Disease  Hypertension  +++ for CAD.   Social History positive tobacco (Greater than 1 year), Quit smoking ~ 15 yrs ago.  No etoh/drugs.   Place of Living Home  Lives @ home with his wife in [place].  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       gerd:    presyncope:    OSA:    PAF:    paroxysmal VT:    COPD:        Admit Reason:   Chest pain (786.50): Status: Active, Coding System: ICD9, Coded Name: Chest pain, unspecified  Home Medications:  Medication Instructions Status  omeprazole 20 mg oral delayed release capsule 1 cap(s) orally 2 times a day Active  Tylenol Extra Strength 500 mg oral tablet 2-3 tab(s) orally 2 times a day Active  multivitamin 1 tab(s) orally once a day Active   Lab Results:  Thyroid:  04-Aug-15 18:51   Thyroid Stimulating Hormone 2.38 (0.45-4.50 (International Unit)  ----------------------- Pregnant patients have  different reference  ranges for TSH:  - - - - - - - - - -  Pregnant, first trimetser:  0.36 - 2.50 uIU/mL)  Hepatic:  04-Aug-15 18:51   Bilirubin, Total 0.3  Alkaline Phosphatase 101 (46-116 NOTE: New Reference Range 06/12/14)  SGPT (ALT) 36 (14-63 NOTE: New Reference Range 06/12/14)  SGOT (AST) 29  Total Protein, Serum 7.2  Albumin, Serum 3.5  Routine Chem:  04-Aug-15 18:51   Glucose, Serum  104  BUN  28  Creatinine (comp) 0.97  Sodium, Serum 141  Potassium, Serum 4.1  Chloride, Serum 107  CO2, Serum 25  Calcium (Total), Serum 8.6  Anion Gap 9  Osmolality (calc) 287  eGFR (African American) >60  eGFR (Non-African American) >60 (eGFR values <59m/min/1.73 m2 may be an indication of chronic kidney disease (CKD). Calculated eGFR is useful in patients with stable renal function. The eGFR calculation will not be reliable in acutely ill patients when serum creatinine is changing rapidly. It is not useful in  patients on  dialysis. The eGFR calculation may not be applicable to patients at the low and high extremes of body sizes, pregnant women, and vegetarians.)  Magnesium, Serum 1.8 (1.8-2.4 THERAPEUTIC RANGE: 4-7 mg/dL TOXIC: > 10 mg/dL  -----------------------)  Ethanol, S. < 3  Ethanol % (comp) < 0.003 (Result(s) reported on 26 Jun 2014 at 07:32PM.)  Lipase 271 (Result(s) reported on 26 Jun 2014  at 07:32PM.)  05-Aug-15 02:36   Cholesterol, Serum 123  Triglycerides, Serum 126  HDL (INHOUSE) 42  VLDL Cholesterol Calculated 25  LDL Cholesterol Calculated 56 (Result(s) reported on 27 Jun 2014 at 03:23AM.)  06-Aug-15 04:52   Glucose, Serum 91  BUN  19  Creatinine (comp) 0.98  Sodium, Serum 140  Potassium, Serum 4.3  Chloride, Serum 106  CO2, Serum 29  Calcium (Total), Serum 8.8  Anion Gap  5  Osmolality (calc) 281  eGFR (African American) >60  eGFR (Non-African American) >60 (eGFR values <56m/min/1.73 m2 may be an indication of chronic kidney disease (CKD). Calculated eGFR is useful in patients with stable renal function. The eGFR calculation will not be reliable in acutely ill patients when serum creatinine is changing rapidly. It is not useful in  patients on dialysis. The eGFR calculation may not be applicable to patients at the low and high extremes of body sizes, pregnant women, and vegetarians.)  Magnesium, Serum  1.6 (1.8-2.4 THERAPEUTIC RANGE: 4-7 mg/dL TOXIC: > 10 mg/dL  -----------------------)  Urine Drugs:  [id number]  Tricyclic Antidepressant, Ur Qual (comp) NEGATIVE (Result(s) reported on 26 Jun 2014 at 09:36PM.)  Amphetamines, Urine Qual. NEGATIVE  MDMA, Urine Qual. NEGATIVE  Cocaine Metabolite, Urine Qual. NEGATIVE  Opiate, Urine qual NEGATIVE  Phencyclidine, Urine Qual. NEGATIVE  Cannabinoid, Urine Qual. NEGATIVE  Barbiturates, Urine Qual. NEGATIVE  Benzodiazepine, Urine Qual. NEGATIVE (----------------- The URINE DRUG SCREEN provides only a preliminary,  unconfirmed analytical test result and should not be used for non-medical  purposes.  Clinical consideration and professional judgment should be  applied to any positive drug screen result due to possible interfering substances.  A more specific alternate chemical method must be used in order to obtain a confirmed analytical result.  Gas chromatography/mass spectrometry (GC/MS) is the preferred confirmatory method.)  Methadone, Urine Qual. NEGATIVE  Cardiac:  04-Aug-15 18:51   CPK-MB, Serum  4.8 (Result(s) reported on 26 Jun 2014 at 07:32PM.)  Troponin I < 0.02 (0.00-0.05 0.05 ng/mL or less: NEGATIVE  Repeat testing in 3-6 hrs  if clinically indicated. >0.05 ng/mL: POTENTIAL  MYOCARDIAL INJURY. Repeat  testing in 3-6 hrs if  clinically indicated. NOTE: An increase or decrease  of 30% or more on serial  testing suggests a  clinically important change)  CK, Total 294 (39-308 NOTE: NEW REFERENCE RANGE  12/25/2013)    22:56   CPK-MB, Serum 3.6 (Result(s) reported on 26 Jun 2014 at 11:47PM.)  Troponin I < 0.02 (0.00-0.05 0.05 ng/mL or less: NEGATIVE  Repeat testing in 3-6 hrs  if clinically indicated. >0.05 ng/mL: POTENTIAL  MYOCARDIAL INJURY. Repeat  testing in 3-6 hrs if  clinically indicated. NOTE: An increase or decrease  of 30% or more on serial  testing suggests a  clinically important change)  05-Aug-15 02:36   CPK-MB, Serum 3.0 (Result(s) reported on 27 Jun 2014 at 03:21AM.)  Troponin I < 0.02 (0.00-0.05 0.05 ng/mL or less: NEGATIVE  Repeat testing in 3-6 hrs  if clinically indicated. >0.05 ng/mL: POTENTIAL  MYOCARDIAL INJURY. Repeat  testing in 3-6 hrs if  clinically indicated. NOTE: An increase or decrease  of 30% or more on serial  testing suggests a  clinically important change)  Routine UA:  04-Aug-15 20:59   Color (UA) Yellow  Clarity (UA) Clear  Glucose (UA) Negative  Bilirubin (UA) Negative  Ketones (UA) Negative  Specific Gravity (UA) 1.016   Blood (UA) Negative  pH (UA) 6.0  Protein (UA) Negative  Nitrite (  UA) Negative  Leukocyte Esterase (UA) Negative (Result(s) reported on 26 Jun 2014 at 09:37PM.)  RBC (UA) NONE SEEN  WBC (UA) NONE SEEN  Bacteria (UA) NONE SEEN  Epithelial Cells (UA) NONE SEEN  Result(s) reported on 26 Jun 2014 at 09:37PM.  Routine Coag:  04-Aug-15 18:51   Activated PTT (APTT)  38.7 (A HCT value >55% may artifactually increase the APTT. In one study, the increase was an average of 19%. Reference: "Effect on Routine and Special Coagulation Testing Values of Citrate Anticoagulant Adjustment in Patients with High HCT Values." American Journal of Clinical Pathology 2006;126:400-405.)  Prothrombin 12.8  INR 1.0 (INR reference interval applies to patients on anticoagulant therapy. A single INR therapeutic range for coumarins is not optimal for all indications; however, the suggested range for most indications is 2.0 - 3.0. Exceptions to the INR Reference Range may include: Prosthetic heart valves, acute myocardial infarction, prevention of myocardial infarction, and combinations of aspirin and anticoagulant. The need for a higher or lower target INR must be assessed individually. Reference: The Pharmacology and Management of the Vitamin K  antagonists: the seventh ACCP Conference on Antithrombotic and Thrombolytic Therapy. GBTDV.7616 Sept:126 (3suppl): [id number]. A HCT value >55% may artifactually increase the PT.  In one study,  the increase was an average of 25%. Reference:  "Effect on Routine and Special Coagulation Testing Values of Citrate Anticoagulant Adjustment in Patients with High HCT Values." American Journal of Clinical Pathology 2006;126:400-405.)  Routine Hem:  04-Aug-15 18:51   WBC (CBC) 7.7  RBC (CBC) 4.98  Hemoglobin (CBC) 14.9  Hematocrit (CBC) 44.7  Platelet Count (CBC) 251  MCV 90  MCH 30.0  MCHC 33.3  RDW 14.1  Neutrophil % 65.5  Lymphocyte % 21.0  Monocyte % 11.5   Eosinophil % 1.2  Basophil % 0.8  Neutrophil # 5.0  Lymphocyte # 1.6  Monocyte # 0.9  Eosinophil # 0.1  Basophil # 0.1 (Result(s) reported on 26 Jun 2014 at 07:24PM.)   EKG:  EKG Interp. by me   Interpretation afib, 143, no acute st/t changes.   Radiology Results: XRay:    04-Aug-15 19:10, Chest Portable Single View  Chest Portable Single View   REASON FOR EXAM:    weakness, racing heartbeat, SOB  COMMENTS:       PROCEDURE: DXR - DXR PORTABLE CHEST SINGLE VIEW  - Jun 26 2014  7:10PM     CLINICAL DATA:  Weakness, dizziness, shortness of breath    EXAM:  PORTABLE CHEST - 1 VIEW    COMPARISON:02/12/2011; chest CT - 02/12/2011    FINDINGS:  Grossly unchanged enlarged cardiac silhouette and mediastinal  contours. Grossly unchanged bilateral infrahilar heterogeneous  opacities, left greater than right, favored to represent atelectasis  or scar. No new focal airspace opacities. No pleural effusion or  pneumothorax. No definite evidence of edema. No acute osseus  abnormalities.     IMPRESSION:  No acute cardiopulmonary disease on this AP portable examination.  Further evaluation with a PA and lateral chest radiograph may be  obtained as clinically indicated.      Electronically Signed    By: Sandi Mariscal M.D.    On: 06/26/2014 19:20     Verified By: Aileen Fass, M.D.,    No Known Allergies:   Vital Signs/Nurse's Notes:  **Vital Signs.:   05-Aug-15 11:53  Vital Signs Type Routine  Temperature Temperature (F) 97.8  Celsius 36.5  Temperature Source oral  Pulse Pulse 76  Respirations Respirations 20  Systolic BP Systolic BP 342  Diastolic BP (mmHg) Diastolic BP (mmHg) 75  Mean BP 90  Pulse Ox % Pulse Ox % 97  Pulse Ox Activity Level  At rest  Oxygen Delivery Room Air/ 21 %    20:34  Vital Signs Type Routine  Temperature Temperature (F) 97.7  Celsius 36.5  Temperature Source oral  Pulse Pulse 80  Respirations Respirations 18  Systolic BP Systolic BP 876   Diastolic BP (mmHg) Diastolic BP (mmHg) 76  Mean BP 86  Pulse Ox % Pulse Ox % 97  Pulse Ox Activity Level  At rest  Oxygen Delivery Room Air/ 21 %    Impression 1.  Afib with RVR:  Pt presented with abrupt onset of tachypalpitations, chest pain, dyspnea, and presyncope beginning yesterday evening sometime around 6PM.  In the ED, he was found to be in afib with RVR.  He was treated with Diltiazem 170m PO x 1 with good response however remains in afib in the 70's.  He feels much better overall but does note generalized malaise and wkns this AM.  He does have a h/o PAF, dx in 2006, and was previously treated with coumadin and flecainide pill-in-the-pocket, though never really had to use it.  Both were d/c'd after a period of 1-2 yrs.  CHA2DS2VASc = 0.  Nl EF by echo @ [facility] in May of this year (Pt had report on iPad and we were able to review).  Electrolytes/TSH wnl.   Given abrupt onset of Ss, it appears afib started last night.  We have begun eliquis and metoprolol this morning.  B/C he had significant c/p, we will plan on a lexiscan MV in the AM.  If this is negative, and he remains in afib come morning, we would plan to treat with flecainide 3080mx 1 and continue to watch on monitor for conversion.  If he successfully converts on flecainide, he could then be discharged on flecainide 30040mrn.  If however, he does not convert, he would require DCCV.  Would plan to continue anticoagulation for 4 wks post conversion.  2.  Midsternal Chest Pain/Pressure:  In setting of above.  Depsite prolonged Ss, troponins have been negative.  ECG during rapid afib w/o acute ST/T changes.  He had reportedly nl cath in 2008 with neg MV in 01/2011.  Given Ss, will plan on lexiscan MV in the AM as outlined above.  I have d/c'd ASA, since he is currently on Eliquis and is w/o objective evidence of ischemia.  If Nuc study is abnl, he will require holding of eliquis and cath on Friday.  If CAD present, we would not be able to  use flecainide.   Plan 3.  H/O Morbid Obesity and OSA:  s/p bariatric surgery in 10/2013 with significant wt loss (90 lbs up to this point).  He has not been using CPAP @ home and has been sleeping better.  He does have f/u sleep eval planned through [facility].   Electronic Signatures for Addendum Section:  HarLeonie ManD) (Signed Addendum 05-Aug-15 17:19)  I have seen and evaluated the patient this afternoon along with Mr. BerSharolyn DouglasP-C.  I agree with his findings, history as well as examination impression/recommendations. The plan was discussed with the patient and formulated together.  Basically symptomatic A. fib with chest pain that was a new finding for him. He had a history of probably lonely. In 2006. In 2000 he had sound like ventricular tachycardia ablation preceded by a  cardiac catheterization that was not notable for any significant CAD. He had a stress test in 2012 with no evidence of ischemia. I am concerned though since he does have risk factors for coronary disease that he was symptomatic with A. fib with chest pain/pressure which had not had before. Prior to initiating any type of antiarrhythmic therapy we would need to know if he does have any ischemic CAD. Therefore we would need to do a stress test first. After the stress test is negative, we can and use of a medication such as flecainide as noted above. If that is not successful with cardioversion, we can then potentially consider TEE guided cardioversion if he is still symptomatic and remaining in the hospital if not "to consider that as an outpatient if he remains in A. fib and symptomatic unable to be discharged.  We will initiate in the granulation because we will consider active cardioversion with either chemical or electrical cardioversion. He would only need to be on Ticlid for about a month based on his CHA2DS2VASc score.   Electronic Signatures: Rogelia Mire (NP)  (Signed 05-Aug-15 12:42)  Authored: General  Aspect/Present Illness, History and Physical Exam, Review of System, Family & Social History, Home Medications, Labs, EKG , Radiology, Allergies, Vital Signs/Nurse's Notes, Impression/Plan Rise Mu (PA-C)  (Signed 06-Aug-15 12:13)  Authored: History and Physical Exam, Past Medical History, Home Medications, Labs, Vital Signs/Nurse's Notes Leonie Man (MD)  (Signed 05-Aug-15 17:16)  Authored: History and Physical Exam, Review of System, Family & Social History, Health Issues, Labs  Co-Signer: General Aspect/Present Illness, Home Medications, Allergies   Last Updated: 06-Aug-15 12:13 by Rise Mu (PA-C)

## 2015-03-21 ENCOUNTER — Ambulatory Visit
Admit: 2015-03-21 | Disposition: A | Discharge status: Home / Self Care | Payer: Self-pay | Admitting: Unknown Physician Specialty

## 2015-04-18 DIAGNOSIS — S76112A Strain of left quadriceps muscle, fascia and tendon, initial encounter: Secondary | ICD-10-CM | POA: Insufficient documentation

## 2015-04-18 DIAGNOSIS — M12562 Traumatic arthropathy, left knee: Secondary | ICD-10-CM | POA: Insufficient documentation

## 2015-06-24 ENCOUNTER — Encounter: Payer: Self-pay | Admitting: *Deleted

## 2015-06-25 ENCOUNTER — Telehealth: Payer: Self-pay | Admitting: Cardiology

## 2015-06-25 NOTE — Telephone Encounter (Signed)
Dr harding spoke with dr Denton Lank

## 2015-06-28 ENCOUNTER — Ambulatory Visit: Payer: BLUE CROSS/BLUE SHIELD | Admitting: Anesthesiology

## 2015-06-28 ENCOUNTER — Encounter
Admission: RE | Disposition: A | Discharge status: Home / Self Care | Payer: Self-pay | Source: Ambulatory Visit | Attending: Unknown Physician Specialty

## 2015-06-28 ENCOUNTER — Ambulatory Visit
Admission: RE | Admit: 2015-06-28 | Discharge: 2015-06-28 | Disposition: A | Discharge status: Home / Self Care | Payer: BLUE CROSS/BLUE SHIELD | Source: Ambulatory Visit | Attending: Unknown Physician Specialty | Admitting: Unknown Physician Specialty

## 2015-06-28 DIAGNOSIS — Z7982 Long term (current) use of aspirin: Secondary | ICD-10-CM | POA: Diagnosis not present

## 2015-06-28 DIAGNOSIS — N400 Enlarged prostate without lower urinary tract symptoms: Secondary | ICD-10-CM | POA: Diagnosis not present

## 2015-06-28 DIAGNOSIS — M549 Dorsalgia, unspecified: Secondary | ICD-10-CM | POA: Diagnosis not present

## 2015-06-28 DIAGNOSIS — J300 Vasomotor rhinitis: Secondary | ICD-10-CM | POA: Insufficient documentation

## 2015-06-28 DIAGNOSIS — J449 Chronic obstructive pulmonary disease, unspecified: Secondary | ICD-10-CM | POA: Diagnosis not present

## 2015-06-28 DIAGNOSIS — G4730 Sleep apnea, unspecified: Secondary | ICD-10-CM | POA: Diagnosis not present

## 2015-06-28 DIAGNOSIS — Z9889 Other specified postprocedural states: Secondary | ICD-10-CM | POA: Insufficient documentation

## 2015-06-28 DIAGNOSIS — I739 Peripheral vascular disease, unspecified: Secondary | ICD-10-CM | POA: Insufficient documentation

## 2015-06-28 DIAGNOSIS — Z791 Long term (current) use of non-steroidal anti-inflammatories (NSAID): Secondary | ICD-10-CM | POA: Insufficient documentation

## 2015-06-28 DIAGNOSIS — K219 Gastro-esophageal reflux disease without esophagitis: Secondary | ICD-10-CM | POA: Insufficient documentation

## 2015-06-28 DIAGNOSIS — M65862 Other synovitis and tenosynovitis, left lower leg: Secondary | ICD-10-CM | POA: Diagnosis not present

## 2015-06-28 DIAGNOSIS — G8929 Other chronic pain: Secondary | ICD-10-CM | POA: Diagnosis not present

## 2015-06-28 DIAGNOSIS — Z6832 Body mass index (BMI) 32.0-32.9, adult: Secondary | ICD-10-CM | POA: Insufficient documentation

## 2015-06-28 DIAGNOSIS — Z803 Family history of malignant neoplasm of breast: Secondary | ICD-10-CM | POA: Insufficient documentation

## 2015-06-28 DIAGNOSIS — M25562 Pain in left knee: Secondary | ICD-10-CM | POA: Insufficient documentation

## 2015-06-28 DIAGNOSIS — Z8249 Family history of ischemic heart disease and other diseases of the circulatory system: Secondary | ICD-10-CM | POA: Diagnosis not present

## 2015-06-28 DIAGNOSIS — Z825 Family history of asthma and other chronic lower respiratory diseases: Secondary | ICD-10-CM | POA: Insufficient documentation

## 2015-06-28 DIAGNOSIS — Z79899 Other long term (current) drug therapy: Secondary | ICD-10-CM | POA: Insufficient documentation

## 2015-06-28 DIAGNOSIS — H9193 Unspecified hearing loss, bilateral: Secondary | ICD-10-CM | POA: Insufficient documentation

## 2015-06-28 HISTORY — DX: Other complications of anesthesia, initial encounter: T88.59XA

## 2015-06-28 HISTORY — DX: Adverse effect of unspecified anesthetic, initial encounter: T41.45XA

## 2015-06-28 HISTORY — DX: Unspecified osteoarthritis, unspecified site: M19.90

## 2015-06-28 HISTORY — PX: KNEE ARTHROSCOPY: SHX127

## 2015-06-28 HISTORY — DX: Presence of dental prosthetic device (complete) (partial): Z97.2

## 2015-06-28 HISTORY — DX: Presence of external hearing-aid: Z97.4

## 2015-06-28 SURGERY — ARTHROSCOPY, KNEE
Anesthesia: General | Laterality: Left | Wound class: Clean

## 2015-06-28 MED ORDER — PROMETHAZINE HCL 25 MG/ML IJ SOLN
6.2500 mg | Freq: Once | INTRAMUSCULAR | Status: AC | PRN
  Administered 2015-06-28: 6.25 mg via INTRAVENOUS

## 2015-06-28 MED ORDER — DEXAMETHASONE SODIUM PHOSPHATE 4 MG/ML IJ SOLN
INTRAMUSCULAR | Status: DC | PRN
  Administered 2015-06-28: 8 mg via INTRAVENOUS

## 2015-06-28 MED ORDER — LACTATED RINGERS IV SOLN
INTRAVENOUS | Status: DC
Start: 2015-06-28 — End: 2015-06-28
  Administered 2015-06-28: 07:00:00 via INTRAVENOUS

## 2015-06-28 MED ORDER — OXYCODONE HCL 5 MG/5ML PO SOLN: 5.0000 mg | Freq: Once | ORAL | Status: DC | PRN

## 2015-06-28 MED ORDER — ONDANSETRON HCL 4 MG/2ML IJ SOLN
INTRAMUSCULAR | Status: DC | PRN
  Administered 2015-06-28: 4 mg via INTRAVENOUS

## 2015-06-28 MED ORDER — PROPOFOL 10 MG/ML IV BOLUS
INTRAVENOUS | Status: DC | PRN
  Administered 2015-06-28: 50 mg via INTRAVENOUS
  Administered 2015-06-28: 150 mg via INTRAVENOUS

## 2015-06-28 MED ORDER — BUPIVACAINE HCL (PF) 0.5 % IJ SOLN
INTRAMUSCULAR | Status: DC | PRN
  Administered 2015-06-28: 20 mL

## 2015-06-28 MED ORDER — NORCO 5-325 MG PO TABS: 1.0000 | ORAL_TABLET | Freq: Four times a day (QID) | ORAL | Status: DC | PRN

## 2015-06-28 MED ORDER — LIDOCAINE HCL (CARDIAC) 20 MG/ML IV SOLN
INTRAVENOUS | Status: DC | PRN
  Administered 2015-06-28: 40 mg via INTRATRACHEAL

## 2015-06-28 MED ORDER — HYDROMORPHONE HCL 1 MG/ML IJ SOLN
0.2500 mg | INTRAMUSCULAR | Status: DC | PRN
  Administered 2015-06-28: 0.5 mg via INTRAVENOUS

## 2015-06-28 MED ORDER — OXYCODONE HCL 5 MG PO TABS: 5.0000 mg | ORAL_TABLET | Freq: Once | ORAL | Status: DC | PRN

## 2015-06-28 MED ORDER — MIDAZOLAM HCL 5 MG/5ML IJ SOLN
INTRAMUSCULAR | Status: DC | PRN
  Administered 2015-06-28: 2 mg via INTRAVENOUS

## 2015-06-28 MED ORDER — FENTANYL CITRATE (PF) 100 MCG/2ML IJ SOLN
INTRAMUSCULAR | Status: DC | PRN
  Administered 2015-06-28: 100 ug via INTRAVENOUS

## 2015-06-28 SURGICAL SUPPLY — 44 items
ARTHROWAND PARAGON T2 (SURGICAL WAND)
BLADE ABRADER 4.5 (BLADE) ×3 IMPLANT
BLADE SHAVER 4.5X7 STR FR (MISCELLANEOUS) ×3 IMPLANT
BUR RADIUS 3.5 (BURR) IMPLANT
BUR RADIUS 4.0X18.5 (BURR) IMPLANT
BUR ROUND 5.5 (BURR) IMPLANT
BURR ROUND 12 FLUTE 4.0MM (BURR) IMPLANT
COVER LIGHT HANDLE FLEXIBLE (MISCELLANEOUS) ×3 IMPLANT
CUFF TOURN SGL QUICK 24 (TOURNIQUET CUFF)
CUFF TOURN SGL QUICK 30 (MISCELLANEOUS) ×2
CUFF TOURN SGL QUICK 34 (TOURNIQUET CUFF)
CUFF TRNQT CYL 24X4X40X1 (TOURNIQUET CUFF) IMPLANT
CUFF TRNQT CYL 34X4X40X1 (TOURNIQUET CUFF) IMPLANT
CUFF TRNQT CYL LO 30X4X (MISCELLANEOUS) ×1 IMPLANT
CUTTER SLOTTED WHISKER 4.0 (BURR) IMPLANT
DRAPE LEGGINS SURG 28X43 STRL (DRAPES) ×3 IMPLANT
DRESSING TELFA 4X3 1S ST N-ADH (GAUZE/BANDAGES/DRESSINGS) ×3 IMPLANT
DURAPREP 26ML APPLICATOR (WOUND CARE) ×3 IMPLANT
GAUZE SPONGE 4X4 12PLY STRL (GAUZE/BANDAGES/DRESSINGS) ×3 IMPLANT
GLOVE BIO SURGEON STRL SZ7.5 (GLOVE) ×6 IMPLANT
GLOVE BIO SURGEON STRL SZ8 (GLOVE) ×3 IMPLANT
GLOVE INDICATOR 8.0 STRL GRN (GLOVE) ×6 IMPLANT
GOWN STRL REIN 2XL XLG LVL4 (GOWN DISPOSABLE) ×3 IMPLANT
GOWN STRL REUS W/TWL 2XL LVL3 (GOWN DISPOSABLE) ×3 IMPLANT
HEMOVAC 400CC 10FR (MISCELLANEOUS) ×3 IMPLANT
IMMOB KNEE 24 THIGH 24 443303 (SOFTGOODS) ×3 IMPLANT
IV LACTATED RINGER IRRG 3000ML (IV SOLUTION) ×8
IV LR IRRIG 3000ML ARTHROMATIC (IV SOLUTION) ×4 IMPLANT
MANIFOLD 4PT FOR NEPTUNE1 (MISCELLANEOUS) ×3 IMPLANT
PACK ARTHROSCOPY KNEE (MISCELLANEOUS) ×3 IMPLANT
SET TUBE SUCT SHAVER OUTFL 24K (TUBING) ×3 IMPLANT
SOL PREP PVP 2OZ (MISCELLANEOUS) ×3
SOLUTION PREP PVP 2OZ (MISCELLANEOUS) ×1 IMPLANT
SUT ETHILON 3-0 FS-10 30 BLK (SUTURE) ×3
SUTURE EHLN 3-0 FS-10 30 BLK (SUTURE) ×1 IMPLANT
TAPE MICROFOAM 4IN (TAPE) ×3 IMPLANT
TUBING ARTHRO INFLOW-ONLY STRL (TUBING) ×3 IMPLANT
WAND ARTHRO PARAGON T2 (SURGICAL WAND) IMPLANT
WAND COVAC 50 IFS (MISCELLANEOUS) IMPLANT
WAND HAND CNTRL MULTIVAC 50 (MISCELLANEOUS) IMPLANT
WAND HAND CNTRL MULTIVAC 90 (MISCELLANEOUS) IMPLANT
WAND MEGAVAC 90 (MISCELLANEOUS) IMPLANT
WAND ULTRAVAC 90 (MISCELLANEOUS) IMPLANT
WRAP KNEE W/COLD PACKS 25.5X14 (SOFTGOODS) ×3 IMPLANT

## 2015-06-28 NOTE — Anesthesia Procedure Notes (Signed)
Procedure Name: LMA Insertion Date/Time: 06/28/2015 7:50 AM Performed by: Londell Moh Pre-anesthesia Checklist: Patient identified, Emergency Drugs available, Suction available, Timeout performed and Patient being monitored Patient Re-evaluated:Patient Re-evaluated prior to inductionOxygen Delivery Method: Circle system utilized Preoxygenation: Pre-oxygenation with 100% oxygen Intubation Type: IV induction LMA: LMA inserted LMA Size: 5.0 Number of attempts: 1 Placement Confirmation: positive ETCO2 and breath sounds checked- equal and bilateral Tube secured with: Tape

## 2015-06-28 NOTE — Anesthesia Preprocedure Evaluation (Addendum)
Anesthesia Evaluation  Patient identified by MRN, date of birth, ID band  Reviewed: NPO status   History of Anesthesia Complications (+) PROLONGED EMERGENCE and history of anesthetic complications  Airway Mallampati: II  TM Distance: >3 FB Neck ROM: full    Dental  (+) Upper Dentures,    Pulmonary sleep apnea (severe) and Continuous Positive Airway Pressure Ventilation , former smoker,    Pulmonary exam normal       Cardiovascular Exercise Tolerance: Good negative cardio ROS Normal cardiovascular exam+ dysrhythmias (p. AFIB; VT ablation in 2008) Atrial Fibrillation and Ventricular Tachycardia     Neuro/Psych negative neurological ROS  negative psych ROS   GI/Hepatic negative GI ROS, Neg liver ROS, GERD-  Controlled,  Endo/Other  negative endocrine ROS  Renal/GU negative Renal ROS  negative genitourinary   Musculoskeletal  (+) Arthritis -,   Abdominal   Peds  Hematology negative hematology ROS (+)   Anesthesia Other Findings Ekg: SR with PACs;  Gastric sleeve in 2014 > lost 100 lbs;   See Cards note Jan 2016 from Dr. Ellyn Hack; spoke with Dr. Ellyn Hack on 8/2 > pt ok to proceed.  Lone Afib & ventricular tachycardia status post ablation, morbid obesity status post gastric bypass surgery.  In August 2015, when he was admitted to [facility] with chest pain dyspnea. He was in A. Fib RVR with rates in the 140s. Symptoms improved with reduced heart rate but he still felt quite symptomatic while in A. Fib. He was started on a beta blocker and chemically cardioverted with flecainide after a nuclear stress test did not reveal any ischemia, and echocardiogram was normal.    Reproductive/Obstetrics                            Anesthesia Physical Anesthesia Plan  ASA: III  Anesthesia Plan: General   Post-op Pain Management:    Induction:   Airway Management Planned:   Additional Equipment:   Intra-op  Plan:   Post-operative Plan:   Informed Consent: I have reviewed the patients History and Physical, chart, labs and discussed the procedure including the risks, benefits and alternatives for the proposed anesthesia with the patient or authorized representative who has indicated his/her understanding and acceptance.     Plan Discussed with: CRNA  Anesthesia Plan Comments:        Anesthesia Quick Evaluation

## 2015-06-28 NOTE — Discharge Instructions (Signed)
General Anesthesia, Care After Refer to this sheet in the next few weeks. These instructions provide you with information on caring for yourself after your procedure. Your health care provider may also give you more specific instructions. Your treatment has been planned according to current medical practices, but problems sometimes occur. Call your health care provider if you have any problems or questions after your procedure. WHAT TO EXPECT AFTER THE PROCEDURE After the procedure, it is typical to experience:  Sleepiness.  Nausea and vomiting. HOME CARE INSTRUCTIONS  For the first 24 hours after general anesthesia:  Have a responsible person with you.  Do not drive a car. If you are alone, do not take public transportation.  Do not drink alcohol.  Do not take medicine that has not been prescribed by your health care provider.  Do not sign important papers or make important decisions.  You may resume a normal diet and activities as directed by your health care provider.  Change bandages (dressings) as directed.  If you have questions or problems that seem related to general anesthesia, call the hospital and ask for the anesthetist or anesthesiologist on call. SEEK MEDICAL CARE IF:  You have nausea and vomiting that continue the day after anesthesia.  You develop a rash. SEEK IMMEDIATE MEDICAL CARE IF:   You have difficulty breathing.  You have chest pain.  You have any allergic problems. Document Released: 02/15/2001 Document Revised: 11/14/2013 Document Reviewed: 05/25/2013 [facility] Patient Information 2015 [place], [place]. This information is not intended to replace advice given to you by your health care provider. Make sure you discuss any questions you have with your health care provider.    [facility]  Kathrene Alu., M.D. [phone]   KNEE ARTHROSCOPY POST OPERATION INSTRUCTIONS:  PLEASE READ THESE INSTRUCTIONS  ABOUT POST OPERATION CARE. THEY WILL ANSWER MOST OF YOUR QUESTIONS.  You have been given a prescription for pain. Please take as directed for pain.  You can walk, keeping the knee slightly stiff-avoid doing too much bending the first day. (if ACL reconstruction is performed, keep brace locked in extension when walking.)  You will use crutches or cane if needed. Can weight bear as tolerated  Plan to take three to four days off from work. You can resume work when you are comfortable. (This can be a week or more, depending on the type of work you do.)  To reduce pain and swelling, place one to two pillows under the knee the first two or three days when sitting or lying. An ice pack may be placed on top of the area over the dressing. Instructions for making homemade icepack are as follow:  Flexible homemade alcohol water ice pack  2 cups water  1 cup rubbing alcohol  food coloring for the blue tint (optional)  2 zip-top bags - gallon-size  Mix the water and alcohol together in one of your zip-top bags and add food coloring. Release as much air as possible and seal the bag. Place in freezer for at least 12 hours.  The small incisions in your knee are closed with nylon stitches. They will be removed in the office.  The bulky dressing may be removed in the third day after surgery. (If ACL surgery-DO NOT REMOVE BANDAGES). Put a waterproof band-aid over each stitch. Do not put any creams or ointments on wounds. You may shower at this time, but change waterproof band-aids after showering. KEEP INCISIONS CLEAN AND DRY UNTIL YOU RETURN  TO THE OFFICE.  Sometimes the operative area remains somewhat painful and swollen for several weeks. This is usually nothing to worry about, but call if you have any excessive symptoms, especially fever. It is not unusual to have a low grade fever of 99 degrees for the first few days. If persist after 3-4 days call the office. It is not uncommon for the pain to be a little worse on  the third day after surgery.  Begin doing gentle exercises right away. They will be limited by the amount of pain and swelling you have.  Exercising will reduce the swelling, increase motion, and prevent muscle weakness. Exercises: Straight leg raising and gentle knee bending.  Take 81 milligram aspirin twice a day for 2 weeks after meals or milk. This along with elevation will help reduce the possibility of phlebitis in your operated leg.  Avoid strenuous athletics for a minimum of 4 to 6 weeks after arthroscopic surgery (approximately five months if ACL surgery).  If the surgery included ACL reconstruction the brace that is supplied to the extremity post surgery is to be locked in extension when you are asleep and is to be locked in extension when you are ambulating. It can be unlocked for exercises or sitting.  Keep your post surgery appointment that has been made for you. If you do not remember the date call [phone]. Your follow up appointment should be between 7-10 days.   plus RTC Monday to remove drain

## 2015-06-28 NOTE — Transfer of Care (Signed)
Immediate Anesthesia Transfer of Care Note  Patient: Nicholas Reynolds  Procedure(s) Performed: Procedure(s) with comments: ARTHROSCOPY KNEE WITH SYNOVECTOMY (Left) - CPAP  Patient Location: PACU  Anesthesia Type: General  Level of Consciousness: awake, alert  and patient cooperative  Airway and Oxygen Therapy: Patient Spontanous Breathing and Patient connected to supplemental oxygen  Post-op Assessment: Post-op Vital signs reviewed, Patient's Cardiovascular Status Stable, Respiratory Function Stable, Patent Airway and No signs of Nausea or vomiting  Post-op Vital Signs: Reviewed and stable  Complications: No apparent anesthesia complications

## 2015-06-28 NOTE — Addendum Note (Signed)
Addendum  created 06/28/15 1103 by Fidel Levy, MD   Modules edited: Orders

## 2015-06-28 NOTE — Op Note (Signed)
Patient: Nicholas Reynolds  Preoperative diagnosis: Diffuse synovitis left knee  Postop diagnosis: Same  Operation: Arthroscopic complete synovectomy left knee  Surgeon: Vilinda Flake, MD  Anesthesia: Gen.   History: Patient's had a long history of left knee pain.  The plain films revealed no significant degenerative changes . The patient had several aspirations of his left knee joint. His last aspiration revealed serosanguineous fluid.  The patient was scheduled for surgery due to persistent discomfort despite conservative treatment that included several steroid injections.   The patient was taken the operating room where satisfactory general anesthesia was achieved. A tourniquet and leg holder were was applied to the left thigh. The left lower extremity was supported with a well leg holder. The left knee was prepped and draped in usual fashion for an arthroscopic procedure. An inflow cannula was introduced superomedially. The joint was distended with lactated Ringer's. Scope was introduced through an inferolateral puncture wound and a probe through an inferomedial puncture wound. Inspection of the medial compartment revealed  no obvious meniscal pathology. There were some grade 2 femoral chondral changes. These were debrided with a turbo whisker.. Inspection of the intercondylar notch revealed intact cruciates. Inspection of the the lateral compartment revealed no obvious meniscal pathology. There was some grade 2 tibial plateau chondral changes..   The patient was noted to have grade 2 to grade 3 chondral changes in the trochlear groove.  Retropatellar surface was moderately fibrillated. I observed patella tracking from the superomedial portal. The the patella seemed to track fairly well.  The most obvious pathology in the patient's left knee joint was a significant synovitis. The synovium had a brownish appearance, particularly in the suprapatellar pouch. The synovium in the suprapatella  pouch was quite villous in nature. I went ahead and exsanguinated the left lower extremity and inflated the tourniquet. A complete synovectomy was performed using a 4.5 motorized resector. I did use a grasper to obtain some tissue from the suprapatellar pouch for pathologic study. The tourniquet was released after 75 minutes. A Hemovac tube was inserted into the joint prior to the closure of the puncture wounds. It was connected to the Hemovac drainage pouch.  The instruments were removed from the joint at this time. The puncture wounds were closed with 3-0 nylon in vertical mattress fashion. I injected each puncture wound with several cc of half percent Marcaine without epinephrine. Betadine was applied the wounds followed by sterile dressing. A knee immobilizer was applied. An ice pack was applied to the right knee. The patient was awakened and transferred to the stretcher bed. The patient was taken to the recovery room in satisfactory condition.   Blood loss was minimal.

## 2015-06-28 NOTE — Anesthesia Postprocedure Evaluation (Signed)
  Anesthesia Post-op Note  Patient: Nicholas Reynolds  Procedure(s) Performed: Procedure(s) with comments: ARTHROSCOPY KNEE WITH SYNOVECTOMY (Left) - CPAP  Anesthesia type:General  Patient location: PACU  Post pain: Pain level controlled  Post assessment: Post-op Vital signs reviewed, Patient's Cardiovascular Status Stable, Respiratory Function Stable, Patent Airway and No signs of Nausea or vomiting  Post vital signs: Reviewed and stable  Last Vitals:  Filed Vitals:   06/28/15 1030  BP: 141/84  Pulse: 54  Temp:   Resp: 15    Level of consciousness: awake, alert  and patient cooperative  Complications: No apparent anesthesia complications

## 2015-06-28 NOTE — H&P (Signed)
  H and P reviewed. No changes. Uploaded at later date. 

## 2015-07-01 ENCOUNTER — Encounter: Payer: Self-pay | Admitting: Unknown Physician Specialty

## 2015-07-02 LAB — SURGICAL PATHOLOGY

## 2015-07-09 DIAGNOSIS — M12262 Villonodular synovitis (pigmented), left knee: Secondary | ICD-10-CM | POA: Insufficient documentation

## 2015-07-24 ENCOUNTER — Ambulatory Visit (INDEPENDENT_AMBULATORY_CARE_PROVIDER_SITE_OTHER): Payer: BLUE CROSS/BLUE SHIELD | Admitting: Cardiology

## 2015-07-24 ENCOUNTER — Encounter: Payer: Self-pay | Admitting: Cardiology

## 2015-07-24 VITALS — BP 110/68 | HR 58 | Ht 77.0 in | Wt 289.0 lb

## 2015-07-24 DIAGNOSIS — E669 Obesity, unspecified: Secondary | ICD-10-CM | POA: Diagnosis not present

## 2015-07-24 DIAGNOSIS — I83893 Varicose veins of bilateral lower extremities with other complications: Secondary | ICD-10-CM

## 2015-07-24 DIAGNOSIS — R42 Dizziness and giddiness: Secondary | ICD-10-CM

## 2015-07-24 DIAGNOSIS — I480 Paroxysmal atrial fibrillation: Secondary | ICD-10-CM | POA: Diagnosis not present

## 2015-07-24 NOTE — Assessment & Plan Note (Signed)
I think a lot of his weight gain is related to recent surgery and affect seems somewhat immobilized prior to the surgery. Hopefully now he is feeling better after his knee operation he can get back into it exercise routine and watch his diet to lose weight back. He has along with you get back to where he was first met him.

## 2015-07-24 NOTE — Assessment & Plan Note (Signed)
Notably improved since stopping beta-blocker 

## 2015-07-24 NOTE — Assessment & Plan Note (Signed)
Started have some tingling (type sensations. Continue to recommend compression stockings and foot elevation. Would not diurese as there is not significant edema.

## 2015-07-24 NOTE — Progress Notes (Signed)
PATIENT: Nicholas Reynolds MRN: [id number] DOB: [birth year] PCP: Valera Castle, MD  Clinic Note: Chief Complaint  Patient presents with  . Follow-up    6 mo  . Atrial Fibrillation    HPI: Nicholas Reynolds is a 64 y.o. 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 Fib.  I met him in August 2015, when he was admitted to [facility] with chest pain dyspnea and was noted to be in A. Fib RVR with rates in the 140s. Symptoms improved with reduced heart rate but he still felt quite symptomatic while in A. Fib. He was started on a beta blocker and chemically cardioverted with flecainide after a nuclear stress test did not reveal any ischemia, and echocardiogram was normal.  After that he was successfully chemically cardioverted with 200 mg flecainide, and discharged on low-dose beta blocker with when necessary tonight per my recommendations. He has also been on ELIQUIS with the plan to continue for at least one month status post cardioversion. He has chosen to now followup with our practice and it seems to be more local.  Interval History: Nicholas Reynolds has been doing very well. He has not had any further episodes to suggest recurrence of A. fib, but he does occasionally note some pauses in his heart rate. This usually occurs in the day when at rest. He really is not taking any medications currently for heart rate or blood pressure is well he is currently only using metoprolol, flecainide and ELIQUIS on as-needed basis. Otherwise he feels great without any significant issues. He had knee surgery last month or so after having been having issues about 6 months. He hasn't really been exercising much as he used to especially over the last 2 months before and after surgery. As result he is put on quite a pounds. He is a bit concerned about this. He denies any PND,  orthopnea but does have mild pedal edema from venous stasis. No weakness or numbness in the legs, but does have some tingling sensations peripherally on bilateral lower extremities suggesting of some mild venous insufficiency.  No rapid/irregular heartbeats but has noted pauses. No TIA/amaurosis fugax/syncope/near syncope. No chest tightness or pressure with rest or exertion.   Past Medical History  Diagnosis Date  . Paroxysmal atrial fibrillation 2002    a) Initially diagnosed as Lone A Fib while in [place] (on ASA & BB after) - was on warfarin for a couple years and a Flecainide Pill-in Pocket PRN; b) recurrent A. fib with RVR August 2050 - status post cardioversion with flecainide, on low-dose beta blocker  . GERD (gastroesophageal reflux disease)   . 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 Family history of premature coronary artery disease     Father  . History of gastric restrictive surgery December 2040    Formerly morbidly obese; gastric sleeve  . Asymptomatic varicose veins of bilateral lower extremities     Uncomfortable but in no ulcers  . Coronary artery disease, non-occlusive July 2008    Nonobstructive by cath   . Pre-syncope May 2015    Unrevealing monitor in May  . Complication of anesthesia     slow to wake after hernia surg.  was taking "a bunch" of herbals at that time  . Obstructive sleep apnea     severe per study 09/05/14   . Uses hearing aid     bilateral  . Arthritis     thumbs  . Wears dentures     partial upper    Prior Cardiac Evaluation and Past Surgical History: Past Surgical History  Procedure Laterality Date  . Laparoscopic gastric sleeve resection      December 20 14th  . Radiofrequency ablation of ventricular tachycardia  July 2000 the     RVOT VT ablation: [facility], [place], [place]  . Transthoracic echocardiogram  May 2015    [facility] - Dr. Sharyn Lull: EF 55%, mild LVH; otherwise mostly normal  . Cardiac catheterization  2008    Nonobstructive CAD  . Event monitor  May 2015    Unrevealing  . Nm myoview ltd  June 28, 2014    [facility]: [facility] -- no evidence of ischemia or infarction. Normal EF ~58%  . Rotator cuff repair    . Cholecystectomy    . Hernia repair    . Elbow surgery    . Knee arthroscopy Left 06/28/2015    Procedure: ARTHROSCOPY KNEE WITH SYNOVECTOMY;  Surgeon: Leanor Kail, MD;  Location: [facility];  Service: Orthopedics;  Laterality: Left;  CPAP    No Known Allergies  Current Outpatient Prescriptions  Medication Sig Dispense Refill  . aspirin 81 MG tablet Take 81 mg by mouth daily.    . cholecalciferol (VITAMIN D) 1000 UNITS tablet Take 1,000 Units by mouth daily.    . flecainide (TAMBOCOR) 100 MG tablet Take 200 mg by mouth as needed.    Marland Kitchen ibuprofen (ADVIL,MOTRIN) 800 MG tablet Take 800 mg by mouth every 8 (eight) hours as needed.    Marland Kitchen KRILL OIL PO Take by mouth.    . metoprolol tartrate (LOPRESSOR) 25 MG tablet Take one 25 mg tablet when you have an afib episode followed by taking 12.5 mg (1/2 tablet) twice daily for 2 days. 30 tablet 3  . Multiple Vitamin (MULTIVITAMIN) tablet Take 1 tablet by mouth daily.    . NON FORMULARY Omega 6 515mTakes 1 tablets daily.    . tamsulosin (FLOMAX) 0.4 MG CAPS capsule Take 0.4 mg by mouth daily. PM     No current facility-administered medications for this visit.    Social History   Social History Narrative   Former smoker who quit over 15 years ago and does not drink alcohol or use drugs.   He is married with 2 children. [place]  Works as an EChief Financial Officer[employer]    family history includes Arrhythmia in his father; COPD in his father; Heart attack in his father; Heart failure in his father.  ROS: A  comprehensive Review of Systems - was performed Review of Systems  Constitutional: Negative for weight loss (Weight gain) and malaise/fatigue.  HENT: Negative for nosebleeds.   Respiratory: Negative for cough.   Cardiovascular: Positive for palpitations and leg swelling (Mild).  Gastrointestinal: Negative for blood in stool and melena.  Genitourinary: Negative for hematuria.  Musculoskeletal: Negative for myalgias.  Neurological: Positive for dizziness (Positional, when first standing).  Endo/Heme/Allergies: Does not bruise/bleed easily.  Psychiatric/Behavioral: The patient is nervous/anxious.   All other systems reviewed and are negative.  Wt Readings from Last 3 Encounters:  07/24/15 289 lb (131.09 kg)  06/28/15 278 lb (126.1 kg)  12/05/14 268 lb 8 oz (121.791 kg)  wgt today was 289.  Physical Exam  Skin: Skin is warm, dry and intact.  varicosities - lower leg(s) bilateral and staisis dermatitis   BP 110/68 mmHg  Pulse 58  Ht 6' 5"  (1.956 m)  Wt 289 lb (131.09 kg)  BMI 34.26 kg/m2  General appearance: alert, cooperative, appears stated age, no distress and mildly obese Neck: no adenopathy, no carotid bruit and no JVD Lungs: CTAB, normal percussion bilaterally and non-labored Heart:RRR w/ occasional Ectopy, S1& S2 normal, no murmur, click, rub or gallop ; non-displaced PMI Abdomen: soft, non-tender; bowel sounds normal; no masses,  no organomegaly; no HJR Extremities: extremities normal, atraumatic, no cyanosis, and edema trace Pulses: 2+ and symmetric;   Neurologic: Mental status: Alert, oriented, thought content appropriate Cranial nerves: normal (II-XII grossly intact  Adult ECG Report  Rate: 58;  Rhythm: sinus bradycardia and Otherwise normal EKG - normal axis, voltage, durations  ASSESSMENT / PLAN: Overall relatively stable the cardiac standpoint..  Problem List Items Addressed This Visit    Obesity (BMI 30-39.9) (Chronic)    I think a lot of his weight gain is  related to recent surgery and affect seems somewhat immobilized prior to the surgery. Hopefully now he is feeling better after his knee operation he can get back into it exercise routine and watch his diet to lose weight back. He has along with you get back to where he was first met him.      Orthostatic dizziness (Chronic)    Notably improved since stopping beta blocker.      Paroxysmal atrial fibrillation - Primary (Chronic)    No recurrent episodes in > 1 year. Not currently on any maintenance medications, only using when necessary. We'll stop metoprolol due to orthostatic dizziness. For when necessary A. fib episodes:  Take 1 metoprolol 25 mg tablet followed by flecainide 200 mg 1 hour following metoprolol.  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 Continue to recommend compression stockings and foot elevation. Would not diurese as there is not significant edema.         Orders Placed This Encounter  Procedures  . EKG 12-Lead   No orders of the defined types were placed in this encounter.      Followup: One year  DAVID W. Ellyn Hack, M.D., M.S. Interventional Cardiolgy [facility]

## 2015-07-24 NOTE — Patient Instructions (Signed)
Medication Instructions:  Your physician recommends that you continue on your current medications as directed. Please refer to the Current Medication list given to you today.   Labwork: none  Testing/Procedures: none  Follow-Up: Your physician wants you to follow-up in: one year with Dr. Harding. You will receive a reminder letter in the mail two months in advance. If you don't receive a letter, please call our office to schedule the follow-up appointment.   Any Other Special Instructions Will Be Listed Below (If Applicable).   

## 2015-07-24 NOTE — Assessment & Plan Note (Signed)
No recurrent episodes in > 1 year. Not currently on any maintenance medications, only using when necessary. We'll stop metoprolol due to orthostatic dizziness. For when necessary A. fib episodes:  Take 1 metoprolol 25 mg tablet followed by flecainide 200 mg 1 hour following metoprolol.  2 days of metoprolol 12/2 twice a day  One month ELIQUIS

## 2015-07-26 ENCOUNTER — Encounter: Payer: Self-pay | Admitting: Cardiology

## 2015-07-26 ENCOUNTER — Other Ambulatory Visit: Payer: Self-pay | Admitting: *Deleted

## 2016-05-28 ENCOUNTER — Encounter: Payer: Self-pay | Admitting: Physician Assistant

## 2016-05-28 ENCOUNTER — Ambulatory Visit (INDEPENDENT_AMBULATORY_CARE_PROVIDER_SITE_OTHER): Payer: 59 | Admitting: Physician Assistant

## 2016-05-28 VITALS — BP 120/82 | HR 71 | Ht 72.0 in | Wt 301.5 lb

## 2016-05-28 DIAGNOSIS — Z8679 Personal history of other diseases of the circulatory system: Secondary | ICD-10-CM

## 2016-05-28 DIAGNOSIS — Z9989 Dependence on other enabling machines and devices: Secondary | ICD-10-CM

## 2016-05-28 DIAGNOSIS — I2510 Atherosclerotic heart disease of native coronary artery without angina pectoris: Secondary | ICD-10-CM | POA: Diagnosis not present

## 2016-05-28 DIAGNOSIS — Z79899 Other long term (current) drug therapy: Secondary | ICD-10-CM

## 2016-05-28 DIAGNOSIS — G4730 Sleep apnea, unspecified: Secondary | ICD-10-CM | POA: Insufficient documentation

## 2016-05-28 DIAGNOSIS — R531 Weakness: Secondary | ICD-10-CM

## 2016-05-28 DIAGNOSIS — I480 Paroxysmal atrial fibrillation: Secondary | ICD-10-CM | POA: Diagnosis not present

## 2016-05-28 DIAGNOSIS — Z8249 Family history of ischemic heart disease and other diseases of the circulatory system: Secondary | ICD-10-CM

## 2016-05-28 DIAGNOSIS — Z9889 Other specified postprocedural states: Secondary | ICD-10-CM | POA: Diagnosis not present

## 2016-05-28 DIAGNOSIS — G4733 Obstructive sleep apnea (adult) (pediatric): Secondary | ICD-10-CM

## 2016-05-28 DIAGNOSIS — M6289 Other specified disorders of muscle: Secondary | ICD-10-CM

## 2016-05-28 MED ORDER — APIXABAN 5 MG PO TABS: 5.0000 mg | ORAL_TABLET | Freq: Two times a day (BID) | ORAL | Status: DC

## 2016-05-28 MED ORDER — FLECAINIDE ACETATE 50 MG PO TABS: 50.0000 mg | ORAL_TABLET | Freq: Two times a day (BID) | ORAL | Status: DC

## 2016-05-28 NOTE — Patient Instructions (Addendum)
Medication Instructions:  Please take your flecainide to 50 mg twice daily We have refilled your Eliquis 5 mg twice daily  Labwork: BMET, CBC   Testing/Procedures: Your physician has requested that you have a carotid duplex. This test is an ultrasound of the carotid arteries in your neck. It looks at blood flow through these arteries that supply the brain with blood. Allow one hour for this exam. There are no restrictions or special instructions.  Date & time: ________________________  Your physician has recommended that you wear an event monitor. Event monitors are medical devices that record the heart's electrical activity. Doctors most often [place] these monitors to diagnose arrhythmias. Arrhythmias are problems with the speed or rhythm of the heartbeat. The monitor is a small, portable device. You can wear one while you do your normal daily activities. This is usually used to diagnose what is causing palpitations/syncope (passing out).  You will receive a call from [employer] to verify your address before mailing the monitor to your home.   Follow-Up: 6 weeks   Date & time: ________________________   If you need a refill on your cardiac medications before your next appointment, please call your pharmacy.  Cardiac Event Monitoring A cardiac event monitor is a small recording device used to help detect abnormal heart rhythms (arrhythmias). The monitor is used to record heart rhythm when noticeable symptoms such as the following occur:  Fast heartbeats (palpitations), such as heart racing or fluttering.  Dizziness.  Fainting or light-headedness.  Unexplained weakness. The monitor is wired to two electrodes placed on your chest. Electrodes are flat, sticky disks that attach to your skin. The monitor can be worn for up to 30 days. You will wear the monitor at all times, except when bathing.  HOW TO USE YOUR CARDIAC EVENT MONITOR A technician will prepare your chest for the electrode  placement. The technician will show you how to place the electrodes, how to work the monitor, and how to replace the batteries. Take time to practice using the monitor before you leave the office. Make sure you understand how to send the information from the monitor to your health care provider. This requires a telephone with a landline, not a cell phone. You need to:  Wear your monitor at all times, except when you are in water:  Do not get the monitor wet.  Take the monitor off when bathing. Do not swim or use a hot tub with it on.  Keep your skin clean. Do not put body lotion or moisturizer on your chest.  Change the electrodes daily or any time they stop sticking to your skin. You might need to use tape to keep them on.  It is possible that your skin under the electrodes could become irritated. To keep this from happening, try to put the electrodes in slightly different places on your chest. However, they must remain in the area under your left breast and in the upper right section of your chest.  Make sure the monitor is safely clipped to your clothing or in a location close to your body that your health care provider recommends.  Press the button to record when you feel symptoms of heart trouble, such as dizziness, weakness, light-headedness, palpitations, thumping, shortness of breath, unexplained weakness, or a fluttering or racing heart. The monitor is always on and records what happened slightly before you pressed the button, so do not worry about being too late to get good information.  Keep a diary of your  activities, such as walking, doing chores, and taking medicine. It is especially important to note what you were doing when you pushed the button to record your symptoms. This will help your health care provider determine what might be contributing to your symptoms. The information stored in your monitor will be reviewed by your health care provider alongside your diary  entries.  Send the recorded information as recommended by your health care provider. It is important to understand that it will take some time for your health care provider to process the results.  Change the batteries as recommended by your health care provider. SEEK IMMEDIATE MEDICAL CARE IF:   You have chest pain.  You have extreme difficulty breathing or shortness of breath.  You develop a very fast heartbeat that persists.  You develop dizziness that does not go away.  You faint or constantly feel you are about to faint.   This information is not intended to replace advice given to you by your health care provider. Make sure you discuss any questions you have with your health care provider.   Document Released: 08/18/2008 Document Revised: 11/30/2014 Document Reviewed: 05/08/2013 Elsevier Interactive Patient Education [employer].

## 2016-05-28 NOTE — Progress Notes (Signed)
Cardiology Office Note Date:  05/28/2016  Patient ID:  Nicholas Reynolds, Nicholas Reynolds 10-Aug-1951, MRN [id number] PCP:  Valera Castle, MD  Cardiologist:  Dr. Ellyn Hack, MD    Chief Complaint: Routine follow up  History of Present Illness: Nicholas Reynolds is a 65 y.o. 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 He was initially diagnosed with lone Afib in [place] with treatment consisting of ASA, beta blocker; eventually warfarin for a couple of years and flecainide pill-in-a-pocket. Patient establised with Dr. Ellyn Hack, MD in 06/2014 while admitted to [facility] with chest pain and was noted to be in Afib with RVR with heart rates in the 140's bpm. His symptoms improved with improvement in heart rate, though he still felt quite symptomatic while in Afib. He was started on a beta blocker and pharmacologically cardioverted with flecainide after a nuclear stress test did not reveal any ischemia. Echo at that time was also normal. He was discharged on low-dose beta blocker and Eliquis for at leat one month s/p cardioversion as above. At his last follow up with Dr. Ellyn Hack, MD in 06/2015 he was doing well. He had not had any further episodes suggesstive of Afib, though did note some occasional pauses in his heart rate at rest. He was not taking any medications on a daily basis for his blood pressure or heart rate. He was only taking metoprolol, flecainide and Eliquis on an as needed basis. His beta blocker had led to some orthostatic dizziness and he did not want to take this daily. He was continued on prn metoprolol and flecainide as well as advised to take one month of Eliquis.   He comes in today stating he has had some increase Afib/palpitation burden. He reports 2 episodes of Afib with RVR over the past 4 months, with the most  recent occuring on 05/25/2016. He took flecainide pill-in-the-pocket both times with resolution of the palpitations after he slept. He did not take metoprolol or Eliquis. He also notes some intermittent episodes of less noticeable palpitations only with exertion. He does not have these episodes every time he exerts himself. These episodes last a few seconds, are associated with dizziness, and self resolve. They do not feel like his prior VT. He would like to start a daily maintenance medication in an effort to decrease his Afib/palpitation burden.   He denies any increased weight gain, LE edema, or early satiety. He has stable 2-pillow orthopnea.    Past Medical History  Diagnosis Date  . Paroxysmal atrial fibrillation ([place]) 2002    a) Initially diagnosed as [place] Fib while in [place] (on ASA & BB after) - was on warfarin for a couple years and a Flecainide Pill-in Pocket PRN; b) recurrent A. fib with RVR August 2050 - status post cardioversion with flecainide, on low-dose beta blocker  . GERD (gastroesophageal reflux disease)   . 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 Family history of premature coronary artery disease     Father  . History of gastric restrictive surgery December 2040    Formerly morbidly obese; gastric sleeve  . Asymptomatic varicose veins of bilateral lower extremities  Uncomfortable but in no ulcers  . Coronary artery disease, non-occlusive July 2008    Nonobstructive by cath   . Pre-syncope May 2015    Unrevealing monitor in May  . Complication of anesthesia     slow to wake after hernia surg.  was taking "a bunch" of herbals at that time  . Obstructive sleep apnea     severe per study 09/05/14   . Uses hearing aid     bilateral  . Arthritis     thumbs    . Wears dentures     partial upper    Past Surgical History  Procedure Laterality Date  . Laparoscopic gastric sleeve resection      December 20 14th  . Radiofrequency ablation of ventricular tachycardia  July 2000 the    RVOT VT ablation: [facility], [place], [place]  . Transthoracic echocardiogram  May 2015    [facility] - Dr. Sharyn Lull: EF 55%, mild LVH; otherwise mostly normal  . Cardiac catheterization  2008    Nonobstructive CAD  . Event monitor  May 2015    Unrevealing  . Nm myoview ltd  June 28, 2014    [facility]: [facility] -- no evidence of ischemia or infarction. Normal EF ~58%  . Rotator cuff repair    . Cholecystectomy    . Hernia repair    . Elbow surgery    . Knee arthroscopy Left 06/28/2015    Procedure: ARTHROSCOPY KNEE WITH SYNOVECTOMY;  Surgeon: Leanor Kail, MD;  Location: [facility];  Service: Orthopedics;  Laterality: Left;  CPAP    Current Outpatient Prescriptions  Medication Sig Dispense Refill  . flecainide (TAMBOCOR) 50 MG tablet Take 1 tablet (50 mg total) by mouth 2 (two) times daily. 180 tablet 3  . ibuprofen (ADVIL,MOTRIN) 800 MG tablet Take 800 mg by mouth every 8 (eight) hours as needed.    Marland Kitchen LORATADINE ALLERGY RELIEF PO Take by mouth.    . Multiple Vitamin (MULTIVITAMIN) tablet Take 1 tablet by mouth daily.    . NON FORMULARY Omega 6 500mg Takes 1 tablets daily.    . NON FORMULARY Tumeric one tablet daily.    Marland Kitchen omeprazole (PRILOSEC) 20 MG capsule Take 20 mg by mouth daily.    . tamsulosin (FLOMAX) 0.4 MG CAPS capsule Take 0.4 mg by mouth daily. PM    . apixaban (ELIQUIS) 5 MG TABS tablet Take 1 tablet (5 mg total) by mouth 2 (two) times daily. 180 tablet 3   No current facility-administered medications for this visit.    Allergies:   Review of patient's allergies indicates no known allergies.   Social History:  The patient  reports that he quit smoking about 16 years ago. He does not have any smokeless tobacco history  on file. He reports that he does not drink alcohol or use illicit drugs.   Family History:  The patient's family history includes Arrhythmia in his father; COPD in his father; Heart attack in his father; Heart failure in his father.  ROS:   Review of Systems  Constitutional: Negative for fever, chills, weight loss, malaise/fatigue and diaphoresis.  HENT: Negative for congestion.   Eyes: Negative for discharge and redness.  Respiratory: Negative for cough, sputum production, shortness of breath and wheezing.   Cardiovascular: Positive for palpitations. Negative for chest pain, orthopnea, claudication, leg swelling and PND.  Gastrointestinal: Negative for nausea, vomiting and abdominal pain.  Musculoskeletal: Negative for myalgias and falls.  Skin: Negative for rash.  Neurological: Negative for dizziness, tingling, tremors, sensory change, speech change, focal weakness, loss of consciousness and weakness.  Endo/Heme/Allergies: Does not bruise/bleed easily.  Psychiatric/Behavioral: Negative for substance abuse. The patient is not nervous/anxious.   All other systems reviewed and are negative.    PHYSICAL EXAM:  VS:  BP 120/82 mmHg  Pulse 71  Ht 6' (1.829 m)  Wt 301 lb 8 oz (136.76 kg)  BMI 40.88 kg/m2 BMI: Body mass index is 40.88 kg/(m^2).  Physical Exam  Constitutional: He is oriented to person, place, and time. He appears well-developed and well-nourished.  HENT:  Head: Normocephalic and atraumatic.  Eyes: Right eye exhibits no discharge. Left eye exhibits no discharge.  Neck: Normal range of motion. No JVD present.  Cardiovascular: Normal rate, regular rhythm, S1 normal, S2 normal and normal heart sounds.  Exam reveals no gallop, no distant heart sounds, no friction rub, no midsystolic click and no opening snap.   No murmur heard. Negative for carotid bruit  Pulmonary/Chest: Effort normal and breath sounds normal. No respiratory distress. He has no decreased breath sounds. He has  no wheezes. He has no rales. He exhibits no tenderness.  Abdominal: Soft. He exhibits no distension. There is no tenderness.  Musculoskeletal: He exhibits no edema.  Neurological: He is alert and oriented to person, place, and time.  Skin: Skin is warm and dry. No cyanosis. Nails show no clubbing.  Psychiatric: He has a normal mood and affect. His speech is normal and behavior is normal. Judgment and thought content normal.    EKG:  Was ordered and interpreted by me today. Shows NSR, 71 bpm, no acute st/t changes   Recent Labs: No results found for requested labs within last 365 days.  No results found for requested labs within last 365 days.    Wt Readings from Last 3 Encounters:  05/28/16 301 lb 8 oz (136.76 kg)  07/24/15 289 lb (131.09 kg)  06/28/15 278 lb (126.1 kg)     Other studies reviewed: Additional studies/records reviewed today include: summarized above  ASSESSMENT AND PLAN:  1. PAF: Currently in NSR with heart rate in the 70's bpm. He reports 2 episodes of Afib in the past 4 months, most recently on 05/25/2016. Both episodes resolved s/p flecainide pill-in-the-pocket and sleep. He also notes shorter, less noticeable palpitations at times only when he is exerting himself. These palpitations will last seconds before self resolving and are associated with dizziness. They do not feel like his prior VT. He is uncertain if these episodes are silent Afib, though he is certain the 2 episodes above were Afib with RVR with heart rates in the 120's bpm per his BP cuff. He has not taken Eliquis since he last saw Dr. Ellyn Hack, MD. He also has not taken metoprolol. He would like to start a maintenance medication in an effort to decrease his Afib burden. Patient will start flecainide 50 mg bid (recent nonischemic nuclear stress test and normal echo). If he continues to note breakthrough Afib we may need to increase his dose to 100 mg bid. He will start Eliquis 5 mg bid. Check 15 day event monitor  to evaluate for breakthrough Afib, as well as the shorter, less noticeable palpitations he is having with exertion. Check bmet and cbc. He is traveling to [place] on 7/8 and will be there until late July for work, thus he cannot have the above tests or follow up until he is back in town.   2. Nonobstructive CAD: No symptoms  concerning for angina. Not on beta blocker 2/2 orthostatic dizziness. On Eliquis in place of aspirin. No plans for ischemic evaluation at this time.   3. History of VT s/p ablation: No recent symptoms concerning for VT. The episodes of palpitations he has had do not resemble his prior episode of VT he reports. Beta blocker intolerance 2/2 orthostatic dizziness.   4. 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 No recent episodes. He did not seek evaluation at that time. Check carotid doppler. It is unclear if this is related to the above neck massage vs silent Afib/emboli. He has been restarted on Eliquis as above.  5. Morbid obesity: Weight loss advised.   6. OSA: CPAP.  Disposition: F/u with Dr. Ellyn Hack, MD or myself in 6 weeks  Current medicines are reviewed at length with the patient today.  The patient did not have any concerns regarding medicines.  Melvern Banker PA-C 05/28/2016 3:06 PM     [address] [place], [place] [address] [phone]

## 2016-05-29 ENCOUNTER — Other Ambulatory Visit
Admission: RE | Admit: 2016-05-29 | Discharge: 2016-05-29 | Disposition: A | Discharge status: Home / Self Care | Payer: 59 | Source: Ambulatory Visit | Attending: Physician Assistant | Admitting: Physician Assistant

## 2016-05-29 ENCOUNTER — Other Ambulatory Visit: Payer: Self-pay

## 2016-05-29 DIAGNOSIS — I480 Paroxysmal atrial fibrillation: Secondary | ICD-10-CM | POA: Diagnosis not present

## 2016-05-29 LAB — BASIC METABOLIC PANEL
ANION GAP: 7 (ref 5–15)
BUN: 29 mg/dL — ABNORMAL HIGH (ref 6–20)
CO2: 26 mmol/L (ref 22–32)
Calcium: 9.2 mg/dL (ref 8.9–10.3)
Chloride: 107 mmol/L (ref 101–111)
Creatinine, Ser: 1.01 mg/dL (ref 0.61–1.24)
GFR calc non Af Amer: >60 mL/min (ref >60)
GLUCOSE: 87 mg/dL (ref 65–99)
POTASSIUM: 4 mmol/L (ref 3.5–5.1)
SODIUM: 140 mmol/L (ref 135–145)

## 2016-05-29 LAB — CBC WITH DIFFERENTIAL/PLATELET
BASOS ABS: 0.1 10*3/uL (ref 0.0–0.2)
BASOS: 1 %
EOS (ABSOLUTE): 0.1 10*3/uL (ref 0.0–0.4)
Eos: 2 %
Hematocrit: 41.1 % (ref 37.5–51.0)
Hemoglobin: 14.2 g/dL (ref 12.6–17.7)
IMMATURE GRANS (ABS): 0 10*3/uL (ref 0.0–0.1)
Immature Granulocytes: 0 %
LYMPHS: 23 %
Lymphocytes Absolute: 1.4 10*3/uL (ref 0.7–3.1)
MCH: 29.3 pg (ref 26.6–33.0)
MCHC: 34.5 g/dL (ref 31.5–35.7)
MCV: 85 fL (ref 79–97)
MONOS ABS: 0.5 10*3/uL (ref 0.1–0.9)
Monocytes: 8 %
NEUTROS PCT: 66 %
Neutrophils Absolute: 4 10*3/uL (ref 1.4–7.0)
Platelets: 251 10*3/uL (ref 150–379)
RBC: 4.84 x10E6/uL (ref 4.14–5.80)
RDW: 14.7 % (ref 12.3–15.4)
WBC: 6 10*3/uL (ref 3.4–10.8)

## 2016-06-02 ENCOUNTER — Encounter (INDEPENDENT_AMBULATORY_CARE_PROVIDER_SITE_OTHER): Payer: 59

## 2016-06-02 DIAGNOSIS — I480 Paroxysmal atrial fibrillation: Secondary | ICD-10-CM | POA: Diagnosis not present

## 2016-06-02 DIAGNOSIS — Z9989 Dependence on other enabling machines and devices: Secondary | ICD-10-CM

## 2016-06-02 DIAGNOSIS — Z79899 Other long term (current) drug therapy: Secondary | ICD-10-CM

## 2016-06-02 DIAGNOSIS — Z8679 Personal history of other diseases of the circulatory system: Secondary | ICD-10-CM

## 2016-06-02 DIAGNOSIS — Z9889 Other specified postprocedural states: Secondary | ICD-10-CM

## 2016-06-02 DIAGNOSIS — G4733 Obstructive sleep apnea (adult) (pediatric): Secondary | ICD-10-CM

## 2016-06-02 DIAGNOSIS — I2510 Atherosclerotic heart disease of native coronary artery without angina pectoris: Secondary | ICD-10-CM

## 2016-06-12 ENCOUNTER — Ambulatory Visit: Payer: 59

## 2016-06-12 ENCOUNTER — Other Ambulatory Visit: Payer: Self-pay | Admitting: Physician Assistant

## 2016-06-12 DIAGNOSIS — I480 Paroxysmal atrial fibrillation: Secondary | ICD-10-CM

## 2016-06-12 DIAGNOSIS — R531 Weakness: Secondary | ICD-10-CM

## 2016-06-12 DIAGNOSIS — Z9989 Dependence on other enabling machines and devices: Secondary | ICD-10-CM

## 2016-06-12 DIAGNOSIS — I2510 Atherosclerotic heart disease of native coronary artery without angina pectoris: Secondary | ICD-10-CM

## 2016-06-12 DIAGNOSIS — M6289 Other specified disorders of muscle: Secondary | ICD-10-CM | POA: Diagnosis not present

## 2016-06-12 DIAGNOSIS — Z79899 Other long term (current) drug therapy: Secondary | ICD-10-CM

## 2016-06-12 DIAGNOSIS — Z8679 Personal history of other diseases of the circulatory system: Secondary | ICD-10-CM

## 2016-06-12 DIAGNOSIS — G4733 Obstructive sleep apnea (adult) (pediatric): Secondary | ICD-10-CM

## 2016-06-12 DIAGNOSIS — Z9889 Other specified postprocedural states: Secondary | ICD-10-CM

## 2016-08-11 DIAGNOSIS — M1710 Unilateral primary osteoarthritis, unspecified knee: Secondary | ICD-10-CM | POA: Insufficient documentation

## 2016-08-11 DIAGNOSIS — M179 Osteoarthritis of knee, unspecified: Secondary | ICD-10-CM | POA: Insufficient documentation

## 2016-09-09 DIAGNOSIS — R55 Syncope and collapse: Secondary | ICD-10-CM | POA: Insufficient documentation

## 2016-09-09 DIAGNOSIS — I471 Supraventricular tachycardia: Secondary | ICD-10-CM | POA: Insufficient documentation

## 2017-02-19 ENCOUNTER — Emergency Department
Admission: EM | Admit: 2017-02-19 | Discharge: 2017-02-19 | Disposition: A | Discharge status: Home / Self Care | Payer: 59 | Attending: Emergency Medicine | Admitting: Emergency Medicine

## 2017-02-19 ENCOUNTER — Emergency Department: Payer: 59

## 2017-02-19 ENCOUNTER — Encounter: Payer: Self-pay | Admitting: Emergency Medicine

## 2017-02-19 DIAGNOSIS — Z87891 Personal history of nicotine dependence: Secondary | ICD-10-CM | POA: Insufficient documentation

## 2017-02-19 DIAGNOSIS — D490 Neoplasm of unspecified behavior of digestive system: Secondary | ICD-10-CM

## 2017-02-19 DIAGNOSIS — Z79899 Other long term (current) drug therapy: Secondary | ICD-10-CM | POA: Diagnosis not present

## 2017-02-19 DIAGNOSIS — Z5321 Procedure and treatment not carried out due to patient leaving prior to being seen by health care provider: Secondary | ICD-10-CM | POA: Diagnosis not present

## 2017-02-19 DIAGNOSIS — I2510 Atherosclerotic heart disease of native coronary artery without angina pectoris: Secondary | ICD-10-CM | POA: Insufficient documentation

## 2017-02-19 DIAGNOSIS — K5900 Constipation, unspecified: Secondary | ICD-10-CM | POA: Insufficient documentation

## 2017-02-19 DIAGNOSIS — R1011 Right upper quadrant pain: Secondary | ICD-10-CM

## 2017-02-19 DIAGNOSIS — R110 Nausea: Secondary | ICD-10-CM | POA: Diagnosis present

## 2017-02-19 LAB — LIPASE, BLOOD: LIPASE: 30 U/L (ref 11–51)

## 2017-02-19 LAB — URINALYSIS, COMPLETE (UACMP) WITH MICROSCOPIC
BILIRUBIN URINE: NEGATIVE
Glucose, UA: NEGATIVE mg/dL
HGB URINE DIPSTICK: NEGATIVE
KETONES UR: 5 mg/dL — AB
Leukocytes, UA: NEGATIVE
Nitrite: NEGATIVE
Protein, ur: 30 mg/dL — AB
SPECIFIC GRAVITY, URINE: 1.018 (ref 1.005–1.030)
SQUAMOUS EPITHELIAL / LPF: NONE SEEN
pH: 6 (ref 5.0–8.0)

## 2017-02-19 LAB — COMPREHENSIVE METABOLIC PANEL
ALT: 19 U/L (ref 17–63)
AST: 23 U/L (ref 15–41)
Albumin: 4.3 g/dL (ref 3.5–5.0)
Alkaline Phosphatase: 91 U/L (ref 38–126)
Anion gap: 4 — ABNORMAL LOW (ref 5–15)
BUN: 16 mg/dL (ref 6–20)
CHLORIDE: 106 mmol/L (ref 101–111)
CO2: 29 mmol/L (ref 22–32)
Calcium: 9.5 mg/dL (ref 8.9–10.3)
Creatinine, Ser: 1.11 mg/dL (ref 0.61–1.24)
GFR calc Af Amer: >60 mL/min (ref >60)
GFR calc non Af Amer: >60 mL/min (ref >60)
Glucose, Bld: 102 mg/dL — ABNORMAL HIGH (ref 65–99)
Potassium: 3.8 mmol/L (ref 3.5–5.1)
SODIUM: 139 mmol/L (ref 135–145)
Total Bilirubin: 0.7 mg/dL (ref 0.3–1.2)
Total Protein: 7.7 g/dL (ref 6.5–8.1)

## 2017-02-19 LAB — CBC
HEMATOCRIT: 44.6 % (ref 40.0–52.0)
Hemoglobin: 15 g/dL (ref 13.0–18.0)
MCH: 28.7 pg (ref 26.0–34.0)
MCHC: 33.7 g/dL (ref 32.0–36.0)
MCV: 85.3 fL (ref 80.0–100.0)
Platelets: 258 10*3/uL (ref 150–440)
RBC: 5.23 MIL/uL (ref 4.40–5.90)
RDW: 14.7 % — ABNORMAL HIGH (ref 11.5–14.5)
WBC: 7.5 10*3/uL (ref 3.8–10.6)

## 2017-02-19 LAB — TROPONIN I: Troponin I: <0.03 ng/mL (ref <0.03)

## 2017-02-19 MED ORDER — IOPAMIDOL (ISOVUE-300) INJECTION 61%
75.0000 mL | Freq: Once | INTRAVENOUS | Status: AC | PRN
  Administered 2017-02-19: 75 mL via INTRAVENOUS

## 2017-02-19 MED ORDER — TRAMADOL HCL 50 MG PO TABS
100.0000 mg | ORAL_TABLET | Freq: Once | ORAL | Status: AC
  Administered 2017-02-19: 100 mg via ORAL
  Filled 2017-02-19: qty 2

## 2017-02-19 MED ORDER — TRAMADOL HCL 50 MG PO TABS: 50.0000 mg | ORAL_TABLET | Freq: Four times a day (QID) | ORAL | 0 refills | Status: DC | PRN

## 2017-02-19 MED ORDER — GI COCKTAIL ~~LOC~~
30.0000 mL | Freq: Once | ORAL | Status: AC
  Administered 2017-02-19: 30 mL via ORAL
  Filled 2017-02-19: qty 30

## 2017-02-19 NOTE — ED Notes (Signed)

## 2017-02-19 NOTE — ED Provider Notes (Signed)
Patient's triage EKG does not meet STEMI criteria at this time, but does demonstrate diffuse ST depressions with some elevation noted in aVR. Discussed with charge nurse Nira Conn), patient requested for a next available acute care bed.   Delman Kitten, MD 02/19/17 [facility]

## 2017-02-19 NOTE — ED Notes (Signed)
Pt presents to ED 08 c/o pain in the right lower rib cage area since November. Pt states pain at 7/10 at this time; pt states pain has been getting worse over time; pt is awake, alert and oriented x4; pt is able to speak in complete sentences.

## 2017-02-19 NOTE — ED Triage Notes (Signed)
Pt to ed with c/o right side abd pain x 6 months. Pt with results of CT that was done in [place].  Attached to pt chart. Pt states pain 8/10, also reports nausea, denies vomiting, reports mild constipation.  Also states he has lost 10lbs in the last week.

## 2017-02-19 NOTE — Discharge Instructions (Signed)
The oncology office should call you Monday to arrange an appointment for Monday or Tuesday. If you do not hear from them by lunchtime on Monday please call them at the number provided above. Return to the emergency department for any worsening pain or any other symptom personally concerning to yourself.

## 2017-02-19 NOTE — ED Provider Notes (Signed)
[facility] Emergency Department Provider Note  Time seen: 7:14 PM  I have reviewed the triage vital signs and the nursing notes.   HISTORY  Chief Complaint Abdominal Pain    HPI Nicholas Reynolds is a 66 y.o. male with a past medical history of gastric reflux, gastric sleeve 4 years ago, obesity, presents to the emergency department for abdominal pain. According to the patient for the past 3 months he has been experiencing right-sided abdominal pain. Patient states he is seen several doctors including his primary doctor with a normal workup. Saw a GI doctor with a endoscopy showing mild reflux otherwise negative endoscopy ((1 month ago). Patient had a CT scan of the abdomen and pelvis as well as a chest x-ray performed yesterday in [place] with the patient resides primarily. The CT scan of the abdomen shows a retroperitoneal soft tissue mass, with adenopathy recommending a CT scan of the chest which has not been performed yet. CT of the abdomen also notes a very large prostate otherwise negative CT scan. Patient states he quit smoking 20 years ago. Denies any chest pain or shortness of breath. Patient's pain is in the epigastrium and right upper quadrant, states it has been chronic/constant for the past 3 months 7/10 with occasional spikes to 10/10. Denies any known association with food. Denies any vomiting or diarrhea. States occasional nausea. Denies any dysuria or hematuria. Patient is currently taking omeprazole twice daily as well as sucralfate.  Past Medical History:  Diagnosis Date  . Arthritis    thumbs  . Asymptomatic varicose veins of bilateral lower extremities    Uncomfortable but in no ulcers  . Complication of anesthesia    slow to wake after hernia surg.  was taking "a bunch" of herbals at that time  . Coronary artery disease, non-occlusive July 2008   Nonobstructive by cath   . Family history of premature coronary artery disease    Father  .  GERD (gastroesophageal reflux disease)   . 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 Obstructive sleep apnea    severe per study 09/05/14   . Paroxysmal atrial fibrillation ([place]) 2002   a) Initially diagnosed as Lone A Fib while in [place] (on ASA & BB after) - was on warfarin for a couple years and a Flecainide Pill-in Pocket PRN; b) recurrent A. fib with RVR August 2050 - status post cardioversion with flecainide, on low-dose beta blocker  . Pre-syncope May 2015   Unrevealing monitor in May  . Uses hearing aid    bilateral  . Wears dentures    partial upper    Patient Active Problem List   Diagnosis Date Noted  . Obstructive sleep apnea   . Family history of premature coronary artery disease   . Obesity (BMI 30-39.9) 12/05/2014  . Orthostatic dizziness 12/05/2014  . Paroxysmal atrial fibrillation ([place]) 07/15/2014  . Varicose veins of lower extremity with edema 07/15/2014  . Fatigue 07/15/2014  . H/O ventricular tachycardia 05/24/2007  . Coronary artery disease, non-occlusive 05/24/2007    Past Surgical History:  Procedure Laterality Date  . CARDIAC CATHETERIZATION  2008   Nonobstructive CAD  . CHOLECYSTECTOMY    . ELBOW SURGERY    .  Event monitor  May 2015   Unrevealing  . HERNIA REPAIR    . KNEE ARTHROSCOPY Left 06/28/2015   Procedure: ARTHROSCOPY KNEE WITH SYNOVECTOMY;  Surgeon: Leanor Kail, MD;  Location: [facility];  Service: Orthopedics;  Laterality: Left;  CPAP  . [place] RESECTION     December 20 14th  . NM MYOVIEW LTD  June 28, 2014   [facility]: [facility] -- no evidence of ischemia or infarction. Normal EF ~58%  .  RADIOFREQUENCY ABLATION of Ventricular Tachycardia  July 2000 the   RVOT VT ablation: [facility] [place]    . TRANSTHORACIC ECHOCARDIOGRAM  May 2015   [facility] - Dr. Sharyn Lull: EF 55%, mild LVH; otherwise mostly normal    Prior to Admission medications   Medication Sig Start Date End Date Taking? Authorizing Provider  apixaban (ELIQUIS) 5 MG TABS tablet Take 1 tablet (5 mg total) by mouth 2 (two) times daily. 05/28/16   Rise Mu, PA-C  flecainide (TAMBOCOR) 50 MG tablet Take 1 tablet (50 mg total) by mouth 2 (two) times daily. 05/28/16   Areta Haber Dunn, PA-C  ibuprofen (ADVIL,MOTRIN) 800 MG tablet Take 800 mg by mouth every 8 (eight) hours as needed.    Historical Provider, MD  LORATADINE ALLERGY RELIEF PO Take by mouth.    Historical Provider, MD  Multiple Vitamin (MULTIVITAMIN) tablet Take 1 tablet by mouth daily.    Historical Provider, MD  NON FORMULARY Omega 6 500mg Takes 1 tablets daily.    Historical Provider, MD  NON FORMULARY Tumeric one tablet daily.    Historical Provider, MD  omeprazole (PRILOSEC) 20 MG capsule Take 20 mg by mouth daily.    Historical Provider, MD  tamsulosin (FLOMAX) 0.4 MG CAPS capsule Take 0.4 mg by mouth daily. PM    Historical Provider, MD    No Known Allergies  Family History  Problem Relation Age of Onset  . COPD Father   . Arrhythmia Father   . Heart failure Father   . Heart attack Father     Social History Social History  Substance Use Topics  . Smoking status: Former Smoker    Quit date: 11/24/1999  . Smokeless tobacco: Never Used  . Alcohol use No    Review of Systems Constitutional: Negative for fever. Cardiovascular: Negative for chest pain. Respiratory: Negative for shortness of breath. Gastrointestinal: Right upper quadrant and epigastric abdominal pain. Positive for nausea. Negative for vomiting or diarrhea. Genitourinary: Negative for dysuria. Neurological: Negative for headache 10-point  ROS otherwise negative.  ____________________________________________   PHYSICAL EXAM:  VITAL SIGNS: ED Triage Vitals  Enc Vitals Group     BP 02/19/17 1727 137/80     Pulse Rate 02/19/17 1727 (!) 104     Resp 02/19/17 1727 18     Temp 02/19/17 1727 97.2 F (36.2 C)     Temp Source 02/19/17 1727 Oral     SpO2 02/19/17 1727 97 %     Weight 02/19/17 1728 289 lb (131.1 kg)     Height 02/19/17 1728 6\' 5"  (1.956 m)     Head Circumference --      Peak Flow --      Pain Score 02/19/17 1727 8     Pain Loc --      Pain Edu? --      Excl. in [place]? --     Constitutional: Alert and oriented. Well appearing and in no distress. Eyes:  Normal exam ENT   Head: Normocephalic and atraumatic.   Mouth/Throat: Mucous membranes are moist. Cardiovascular: Normal rate, regular rhythm. No murmur Respiratory: Normal respiratory effort without tachypnea nor retractions. Breath sounds are clear  Gastrointestinal: Soft, moderate right upper quadrant epigastric tenderness to palpation without rebound or guarding. No distention. Obese. Musculoskeletal: Nontender with normal range of motion in all extremities.  Neurologic:  Normal speech and language. No gross focal neurologic deficits  Skin:  Skin is warm, dry and intact.  Psychiatric: Mood and affect are normal.   ____________________________________________    EKG  EKG reviewed and interpreted by myself shows normal sinus rhythm at 99 bpm, narrow QRS, left axis deviation, nonspecific ST changes without ST elevation.  Repeat EKG reviewed and interpreted by myself shows normal sinus rhythm at 89 bpm, narrow QRS, left axis deviation, largely normal intervals with nonspecific ST changes again no ST elevation.  ____________________________________________    RADIOLOGY  IMPRESSION: 1. Lobulated soft tissue mass along the right thoracic spine, which does not appear to involve the neural foramina or spinal canal. The differential diagnosis  includes neurogenic tumors as well as lymphoma. This could be further assessed, if desired clinically, with thoracic MRI with and without contrast. This finding is likely the cause of the patient's right sided chest wall pain. 2. No acute findings within the chest.  ____________________________________________   INITIAL IMPRESSION / ASSESSMENT AND PLAN / ED COURSE  Pertinent labs & imaging results that were available during my care of the patient were reviewed by me and considered in my medical decision making (see chart for details).  Patient presents the emergency department for 3 months of epigastric right upper quadrant pain. Patient mainly lives in [place] travels back to [place] to visit family every couple of weeks. Patient had imaging performed yesterday in [place] and brought outpatient records with him. Imaging shows a retroperitoneal mass but otherwise largely negative CT the abdomen/pelvis recommended chest CT for further evaluation. Patient is very concerned about this and wishes have a chest CT performed. Patient states the pain is essentially constant over the past 3 months but has worsened over the past several weeks. Patient currently rates the pain as a 7 or 8/10. We will attempt treatment with a GI cocktail in the emergency department. Patient does have nonspecific ST changes given his epigastric discomfort we'll obtain a troponin. Patient's labs are otherwise within normal limits including LFTs and lipase. Patient is status post cholecystectomy many years ago per patient. Patient is undergone an endoscopy abruptly one month ago with largely normal results.   CT shows soft tissue mass possible neurogenic tumor versus lymphoma. I discussed this with the patient, he states he is going to stay [facility] for the workup and would prefer to follow-up with our oncology team here. I discussed the patient with the oncologist on call who states they will call the patient Monday  morning for a Monday or Tuesday appointment. Patient is agreeable to this plan. We'll discharge with Ultram for pain control. ____________________________________________   FINAL CLINICAL IMPRESSION(S) / ED DIAGNOSES  Right upper quadrant pain Tumor   Harvest Dark, MD 02/19/17 2046

## 2017-02-22 DIAGNOSIS — R1900 Intra-abdominal and pelvic swelling, mass and lump, unspecified site: Secondary | ICD-10-CM | POA: Insufficient documentation

## 2017-02-22 NOTE — Progress Notes (Signed)
[place]  Telephone:(336) [phone] Fax:(336) [phone]  ID: ROCCO KERKHOFF OB: 1951-04-03  MR#: [id number]  KDT#:[id number]  Patient Care Team: Valera Castle, MD as PCP - General  CHIEF COMPLAINT: Retroperitoneal mass.  INTERVAL HISTORY: Patient is a 66 year old male who initially presented with abdominal pain in [place] on February 18, 2017. Subsequent abdominal and pelvic CT scan revealed retroperitoneal lymphadenopathy as well as abdominal adenopathy. He also was noted to have a 1.4 irregular focus at the left lung base. Finally there was a right paraspinal mass that was incompletely evaluated. Patient subsequently traveled to [place] and presented to the ER with similar symptoms. CT of the chest confirmed a large right paraspinal mass. Patient is highly anxious and has insomnia. He also admits to some unintentional weight loss over the past several months. He has constipation. He denies any fevers or illnesses. He has no neurologic complaints. He denies any shortness of breath, cough, or hemoptysis. He has no nausea, vomiting, or diarrhea. He has no urinary complaints. Patient otherwise feels well and offers no further specific complaints.  REVIEW OF SYSTEMS:   Review of Systems  Constitutional: Positive for weight loss. Negative for fever and malaise/fatigue.  Respiratory: Negative for cough, hemoptysis and shortness of breath.   Cardiovascular: Positive for chest pain. Negative for leg swelling.  Gastrointestinal: Positive for abdominal pain and constipation. Negative for blood in stool, diarrhea, melena, nausea and vomiting.  Genitourinary: Negative.   Musculoskeletal: Positive for back pain.  Neurological: Negative.  Negative for weakness.  Psychiatric/Behavioral: The patient is nervous/anxious and has insomnia.     As per HPI. Otherwise, a complete review of systems is negative.  PAST MEDICAL HISTORY: Past Medical History:  Diagnosis Date  .  Arthritis    thumbs  . Asymptomatic varicose veins of bilateral lower extremities    Uncomfortable but in no ulcers  . Complication of anesthesia    slow to wake after hernia surg.  was taking "a bunch" of herbals at that time  . Coronary artery disease, non-occlusive July 2008   Nonobstructive by cath   . Family history of premature coronary artery disease    Father  . GERD (gastroesophageal reflux disease)   . 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 Obstructive sleep apnea    severe per study 09/05/14   . Paroxysmal atrial fibrillation ([place]) 2002   a) Initially diagnosed as [place] A Fib while in [place] (on ASA & BB after) - was on warfarin for a couple years and a Flecainide Pill-in Pocket PRN; b) recurrent A. fib with RVR August 2050 - status post cardioversion with flecainide, on low-dose beta blocker  . Pre-syncope May 2015   Unrevealing monitor in May  . Uses hearing aid    bilateral  . Wears dentures    partial upper    PAST SURGICAL HISTORY: Past Surgical History:  Procedure Laterality Date  . CARDIAC CATHETERIZATION  2008   Nonobstructive CAD  . CHOLECYSTECTOMY    . ELBOW SURGERY    . Event monitor  May 2015   Unrevealing  . HERNIA REPAIR    . KNEE ARTHROSCOPY Left 06/28/2015   Procedure: ARTHROSCOPY KNEE WITH SYNOVECTOMY;  Surgeon: Leanor Kail, MD;  Location: [facility];  Service: Orthopedics;  Laterality: Left;  CPAP  . [place] RESECTION     December 20 14th  . NM MYOVIEW LTD  June 28, 2014   [facility] -- no evidence of ischemia or infarction. Normal EF ~58%  . RADIOFREQUENCY ABLATION of Ventricular Tachycardia   July 2000 the   RVOT VT ablation: [facility] [place]    . TRANSTHORACIC ECHOCARDIOGRAM  May 2015   [facility] - Dr. Sharyn Lull: EF 55%, mild LVH; otherwise mostly normal    FAMILY HISTORY: Family History  Problem Relation Age of Onset  . Breast cancer Mother   . Bone cancer Mother   . COPD Father   . Arrhythmia Father   . Heart failure Father   . Heart attack Father     ADVANCED DIRECTIVES (Y/N):  N  HEALTH MAINTENANCE: Social History  Substance Use Topics  . Smoking status: Former Smoker    Quit date: 11/24/1999  . Smokeless tobacco: Never Used  . Alcohol use No     Colonoscopy:  PAP:  Bone density:  Lipid panel:  No Known Allergies  Current Outpatient Prescriptions  Medication Sig Dispense Refill  . ibuprofen (ADVIL,MOTRIN) 800 MG tablet Take 800 mg by mouth every 8 (eight) hours as needed.    . metoprolol succinate (TOPROL-XL) 50 MG 24 hr tablet Take 50 mg by mouth 2 (two) times daily. Take with or immediately following a meal.    . Multiple Vitamin (MULTIVITAMIN) tablet Take 1 tablet by mouth daily.    Marland Kitchen omeprazole (PRILOSEC) 40 MG capsule Take 40 mg by mouth 2 (two) times daily.    . tamsulosin (FLOMAX) 0.4 MG CAPS capsule Take 0.4 mg by mouth daily. PM    . traMADol (ULTRAM) 50 MG tablet Take 1 tablet (50 mg total) by mouth every 6 (six) hours as needed. 20 tablet 0  . warfarin (COUMADIN) 5 MG tablet Take 5 mg by mouth daily.    Marland Kitchen lactulose (CHRONULAC) 10 GM/15ML solution Take 15 mLs (10 g total) by mouth 3 (three) times daily. 450 mL 1  . LORATADINE ALLERGY RELIEF PO Take by mouth.    . Oxycodone HCl 10 MG TABS Take 1 tablet (10 mg total) by mouth every 6 (six) hours as needed. 90 tablet 0   No current facility-administered medications for this visit.     OBJECTIVE: Vitals:   02/23/17 0842  BP: 102/71  Pulse: 97  Resp: 20  Temp: 97.7 F (36.5 C)     Body mass index is 38.64 kg/m.    ECOG FS:1 - Symptomatic  but completely ambulatory  General: Well-developed, well-nourished, no acute distress. Eyes: Pink conjunctiva, anicteric sclera. HEENT: Normocephalic, moist mucous membranes, clear oropharnyx. Lungs: Clear to auscultation bilaterally. Heart: Regular rate and rhythm. No rubs, murmurs, or gallops. Abdomen: Soft, nontender, nondistended. No organomegaly noted, normoactive bowel sounds. Musculoskeletal: No edema, cyanosis, or clubbing. Neuro: Alert, answering all questions appropriately. Cranial nerves grossly intact. Skin: No rashes or petechiae noted. Psych: Normal affect. Lymphatics: No cervical, calvicular, axillary or inguinal LAD.   LAB RESULTS:  Lab Results  Component Value Date   NA 139 02/19/2017   K 3.8 02/19/2017   CL 106 02/19/2017   CO2 29 02/19/2017   GLUCOSE 102 (H) 02/19/2017   BUN 16 02/19/2017   CREATININE 1.11 02/19/2017   CALCIUM 9.5 02/19/2017   PROT 7.7 02/19/2017   ALBUMIN 4.3 02/19/2017  AST 23 02/19/2017   ALT 19 02/19/2017   ALKPHOS 91 02/19/2017   BILITOT 0.7 02/19/2017   GFRNONAA >60 02/19/2017   GFRAA >60 02/19/2017    Lab Results  Component Value Date   WBC 7.5 02/19/2017   NEUTROABS 4.0 05/28/2016   HGB 15.0 02/19/2017   HCT 44.6 02/19/2017   MCV 85.3 02/19/2017   PLT 258 02/19/2017     STUDIES: Ct Chest W Contrast  Result Date: 02/19/2017 CLINICAL DATA:  Pt presents to ED c/o pain in the right lower rib cage area since November. Pt states pain at 7/10 at this time; pt states pain has been getting worse over time. Pt with results of abdominal CT that was done in [place]. Attached to pt chart. Pt states pain 8/10, also reports nausea, denies vomiting, reports mild constipation. Also states he has lost 10lbs in the last week EXAM: CT CHEST WITH CONTRAST TECHNIQUE: Multidetector CT imaging of the chest was performed during intravenous contrast administration. CONTRAST:  80mL ISOVUE-300 IOPAMIDOL (ISOVUE-300) INJECTION 61% COMPARISON:  Chest  radiograph, 06/26/2014.  Chest CT, 02/12/2011. FINDINGS: Cardiovascular: No significant vascular findings. Normal heart size. No pericardial effusion. Mediastinum/Nodes: No enlarged mediastinal, hilar, or axillary lymph nodes. Thyroid gland, trachea, and esophagus demonstrate no significant findings. Lungs/Pleura: Minor atelectasis in the right middle lobe, left upper lobe lingula and posteromedial right lower lobe. No evidence of pneumonia or pulmonary edema. Two small subpleural pulmonary nodules in the left lower lobe, largest measuring 4 mm. No pleural effusion or pneumothorax. Upper Abdomen: Changes from previous gastric surgery. Status post cholecystectomy. No acute findings. Musculoskeletal: There is a lobulated soft tissue mass more confluent group of masses, along the right aspect of the thoracic spine, measuring a combined 8.7 cm from superior inferior by 5 x 1.9 cm transversely. There are several areas of mild, chronic appearing resorption of the adjacent vertebra the mass does not extend into a neural foramen or into the spinal canal. IMPRESSION: 1. Lobulated soft tissue mass along the right thoracic spine, which does not appear to involve the neural foramina or spinal canal. The differential diagnosis includes neurogenic tumors as well as lymphoma. This could be further assessed, if desired clinically, with thoracic MRI with and without contrast. This finding is likely the cause of the patient's right sided chest wall pain. 2. No acute findings within the chest. Electronically Signed   By: Lajean Manes M.D.   On: 02/19/2017 20:03    ASSESSMENT: Retroperitoneal mass.   PLAN:    1. Retroperitoneal mass: Patient had EGD on January 29, 2017 that revealed gastropathy with mild esophagitis, but no other pathology. CT results from [place] as well as above were reviewed independently. Highly suspicious for underlying malignancy. Patient will require a PET scan for further evaluation as well as to assess  the safest biopsy location. Have also ordered a CT-guided biopsy to obtain a diagnosis. Since patient is on Coumadin, he will require to discontinue this for 5 days prior to his biopsy and reinitiate treatment one day after biopsy. Return to clinic one week after his biopsy to discuss the results and treatment planning. 2. Atrial fibrillation: Patient underwent RFA on December 21, 2016. He also had a negative nuclear medicine cardiac stress test on January 13, 2017. He is on chronic Coumadin for a left atrial thrombus seen on cardiac echo which will need to be discontinued for biopsy. Patient will require cardiac follow-up in [place].  3. Pain: Patient was given a prescription for tramadol  today. 4. Constipation: Patient was given a prescription for lactulose.  Approximately 45 minutes was spent in discussion of which greater than 50% was consultation.  Patient expressed understanding and was in agreement with this plan. He also understands that He can call clinic at any time with any questions, concerns, or complaints.   Cancer Staging No matching staging information was found for the patient.  Lloyd Huger, MD   02/27/2017 10:43 AM

## 2017-02-23 ENCOUNTER — Encounter: Payer: Self-pay | Admitting: Oncology

## 2017-02-23 ENCOUNTER — Inpatient Hospital Stay: Payer: 59 | Attending: Oncology | Admitting: Oncology

## 2017-02-23 DIAGNOSIS — K219 Gastro-esophageal reflux disease without esophagitis: Secondary | ICD-10-CM | POA: Diagnosis not present

## 2017-02-23 DIAGNOSIS — G4733 Obstructive sleep apnea (adult) (pediatric): Secondary | ICD-10-CM | POA: Insufficient documentation

## 2017-02-23 DIAGNOSIS — K5900 Constipation, unspecified: Secondary | ICD-10-CM | POA: Diagnosis not present

## 2017-02-23 DIAGNOSIS — I2510 Atherosclerotic heart disease of native coronary artery without angina pectoris: Secondary | ICD-10-CM | POA: Diagnosis not present

## 2017-02-23 DIAGNOSIS — Z87891 Personal history of nicotine dependence: Secondary | ICD-10-CM | POA: Insufficient documentation

## 2017-02-23 DIAGNOSIS — Z5111 Encounter for antineoplastic chemotherapy: Secondary | ICD-10-CM | POA: Insufficient documentation

## 2017-02-23 DIAGNOSIS — Z79899 Other long term (current) drug therapy: Secondary | ICD-10-CM | POA: Diagnosis not present

## 2017-02-23 DIAGNOSIS — R1900 Intra-abdominal and pelvic swelling, mass and lump, unspecified site: Secondary | ICD-10-CM

## 2017-02-23 DIAGNOSIS — C8338 Diffuse large B-cell lymphoma, lymph nodes of multiple sites: Secondary | ICD-10-CM | POA: Insufficient documentation

## 2017-02-23 DIAGNOSIS — Z9884 Bariatric surgery status: Secondary | ICD-10-CM | POA: Diagnosis not present

## 2017-02-23 DIAGNOSIS — Z7901 Long term (current) use of anticoagulants: Secondary | ICD-10-CM | POA: Insufficient documentation

## 2017-02-23 DIAGNOSIS — I480 Paroxysmal atrial fibrillation: Secondary | ICD-10-CM | POA: Diagnosis not present

## 2017-02-23 MED ORDER — OXYCODONE HCL 10 MG PO TABS: 10.0000 mg | ORAL_TABLET | Freq: Four times a day (QID) | ORAL | 0 refills | Status: DC | PRN

## 2017-02-23 NOTE — Progress Notes (Signed)
Patient is here for new patient appointment, he mentions pain in his right side of abdomen, he cant sleep due to the pain, he has not had a BM in 8 days.

## 2017-02-25 ENCOUNTER — Telehealth: Payer: Self-pay

## 2017-02-25 MED ORDER — LACTULOSE 10 GM/15ML PO SOLN: 10.0000 g | Freq: Three times a day (TID) | ORAL | 1 refills | Status: DC

## 2017-02-25 NOTE — Telephone Encounter (Signed)
Patient wife is calling stating he has tried miralax and the magnesium citrate and nothing is helping. She wants something called in to the pharmacy.

## 2017-03-01 ENCOUNTER — Ambulatory Visit: Payer: 59

## 2017-03-01 ENCOUNTER — Telehealth: Payer: Self-pay | Admitting: *Deleted

## 2017-03-01 ENCOUNTER — Encounter
Admission: RE | Admit: 2017-03-01 | Discharge: 2017-03-01 | Disposition: A | Discharge status: Home / Self Care | Payer: 59 | Source: Ambulatory Visit | Attending: Oncology | Admitting: Oncology

## 2017-03-01 DIAGNOSIS — R1900 Intra-abdominal and pelvic swelling, mass and lump, unspecified site: Secondary | ICD-10-CM | POA: Diagnosis present

## 2017-03-01 LAB — GLUCOSE, CAPILLARY: GLUCOSE-CAPILLARY: 103 mg/dL — AB (ref 65–99)

## 2017-03-01 MED ORDER — FLUDEOXYGLUCOSE F - 18 (FDG) INJECTION
12.2700 | Freq: Once | INTRAVENOUS | Status: AC | PRN
  Administered 2017-03-01: 12.27 via INTRAVENOUS

## 2017-03-01 NOTE — Telephone Encounter (Signed)
Ct is tomorrow patient wife notified

## 2017-03-01 NOTE — Telephone Encounter (Signed)
Asking when the CT biopsy is going to be scheduled for, he is having PET today. Please return her call

## 2017-03-02 ENCOUNTER — Other Ambulatory Visit: Payer: Self-pay | Admitting: Student

## 2017-03-02 ENCOUNTER — Ambulatory Visit
Admission: RE | Admit: 2017-03-02 | Discharge: 2017-03-02 | Disposition: A | Discharge status: Home / Self Care | Payer: 59 | Source: Ambulatory Visit | Attending: Oncology | Admitting: Oncology

## 2017-03-02 ENCOUNTER — Telehealth: Payer: Self-pay

## 2017-03-02 DIAGNOSIS — K219 Gastro-esophageal reflux disease without esophagitis: Secondary | ICD-10-CM | POA: Insufficient documentation

## 2017-03-02 DIAGNOSIS — I2510 Atherosclerotic heart disease of native coronary artery without angina pectoris: Secondary | ICD-10-CM | POA: Diagnosis not present

## 2017-03-02 DIAGNOSIS — R1900 Intra-abdominal and pelvic swelling, mass and lump, unspecified site: Secondary | ICD-10-CM | POA: Insufficient documentation

## 2017-03-02 DIAGNOSIS — Z87891 Personal history of nicotine dependence: Secondary | ICD-10-CM | POA: Diagnosis not present

## 2017-03-02 DIAGNOSIS — Z836 Family history of other diseases of the respiratory system: Secondary | ICD-10-CM | POA: Insufficient documentation

## 2017-03-02 DIAGNOSIS — Z9049 Acquired absence of other specified parts of digestive tract: Secondary | ICD-10-CM | POA: Insufficient documentation

## 2017-03-02 DIAGNOSIS — I480 Paroxysmal atrial fibrillation: Secondary | ICD-10-CM | POA: Insufficient documentation

## 2017-03-02 DIAGNOSIS — G4733 Obstructive sleep apnea (adult) (pediatric): Secondary | ICD-10-CM | POA: Diagnosis not present

## 2017-03-02 DIAGNOSIS — Z803 Family history of malignant neoplasm of breast: Secondary | ICD-10-CM | POA: Diagnosis not present

## 2017-03-02 DIAGNOSIS — C8330 Diffuse large B-cell lymphoma, unspecified site: Secondary | ICD-10-CM | POA: Insufficient documentation

## 2017-03-02 DIAGNOSIS — Z9884 Bariatric surgery status: Secondary | ICD-10-CM | POA: Insufficient documentation

## 2017-03-02 DIAGNOSIS — Z7902 Long term (current) use of antithrombotics/antiplatelets: Secondary | ICD-10-CM | POA: Diagnosis not present

## 2017-03-02 DIAGNOSIS — Z9889 Other specified postprocedural states: Secondary | ICD-10-CM | POA: Insufficient documentation

## 2017-03-02 DIAGNOSIS — Z8249 Family history of ischemic heart disease and other diseases of the circulatory system: Secondary | ICD-10-CM | POA: Insufficient documentation

## 2017-03-02 DIAGNOSIS — Z6838 Body mass index (BMI) 38.0-38.9, adult: Secondary | ICD-10-CM | POA: Insufficient documentation

## 2017-03-02 DIAGNOSIS — Z808 Family history of malignant neoplasm of other organs or systems: Secondary | ICD-10-CM | POA: Diagnosis not present

## 2017-03-02 LAB — CBC
HCT: 46.9 % (ref 40.0–52.0)
Hemoglobin: 15.7 g/dL (ref 13.0–18.0)
MCH: 28.9 pg (ref 26.0–34.0)
MCHC: 33.5 g/dL (ref 32.0–36.0)
MCV: 86.4 fL (ref 80.0–100.0)
PLATELETS: 286 10*3/uL (ref 150–440)
RBC: 5.44 MIL/uL (ref 4.40–5.90)
RDW: 14.4 % (ref 11.5–14.5)
WBC: 8.1 10*3/uL (ref 3.8–10.6)

## 2017-03-02 LAB — APTT: aPTT: 32 seconds (ref 24–36)

## 2017-03-02 LAB — PROTIME-INR
INR: 1.1
PROTHROMBIN TIME: 14.2 s (ref 11.4–15.2)

## 2017-03-02 MED ORDER — FENTANYL CITRATE (PF) 100 MCG/2ML IJ SOLN
INTRAMUSCULAR | Status: AC | PRN
  Administered 2017-03-02 (×2): 50 ug via INTRAVENOUS

## 2017-03-02 MED ORDER — ONDANSETRON HCL 4 MG/2ML IJ SOLN
INTRAMUSCULAR | Status: AC | PRN
  Administered 2017-03-02: 4 mg via INTRAVENOUS

## 2017-03-02 MED ORDER — SODIUM CHLORIDE 0.9 % IV SOLN
INTRAVENOUS | Status: DC
  Administered 2017-03-02: 14:00:00 via INTRAVENOUS

## 2017-03-02 MED ORDER — OXYCODONE HCL 5 MG PO TABS
10.0000 mg | ORAL_TABLET | Freq: Once | ORAL | Status: AC
  Administered 2017-03-02: 10 mg via ORAL

## 2017-03-02 MED ORDER — MIDAZOLAM HCL 5 MG/5ML IJ SOLN
INTRAMUSCULAR | Status: AC | PRN
  Administered 2017-03-02: 1 mg via INTRAVENOUS

## 2017-03-02 MED ORDER — LORAZEPAM 2 MG/ML IJ SOLN
INTRAMUSCULAR | Status: AC | PRN
  Administered 2017-03-02: 1 mg via INTRAVENOUS

## 2017-03-02 NOTE — H&P (Signed)
Chief Complaint: Patient was seen in consultation today for No chief complaint on file.  at the request of NicholasTimothy Reynolds  Referring Physician(s): NicholasTimothy Reynolds  Supervising Physician: Nicholas Reynolds  Patient Status: [facility] - Out-pt  History of Present Illness: Nicholas Reynolds is a 66 y.o. male who presented with abdominal and chest pain in [place], underwent scans, and was found to have adenopathy. A chest CT and PET were also performed showing multifocal masses including a large thoracic paraspinal mass likely causing his symptoms.  Past Medical History:  Diagnosis Date  . Arthritis    thumbs  . Asymptomatic varicose veins of bilateral lower extremities    Uncomfortable but in no ulcers  . Complication of anesthesia    slow to wake after hernia surg.  was taking "a bunch" of herbals at that time  . Coronary artery disease, non-occlusive July 2008   Nonobstructive by cath   . Family history of premature coronary artery disease    Father  . GERD (gastroesophageal reflux disease)   . 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 Obstructive sleep apnea    severe per study 09/05/14   . Paroxysmal atrial fibrillation ([place]) 2002   a) Initially diagnosed as [place] Fib while in [place] (on ASA & BB after) - was on warfarin for a couple years and a Flecainide Pill-in Pocket PRN; b) recurrent A. fib with RVR August 2050 - status post cardioversion with flecainide, on low-dose beta blocker  . Pre-syncope May 2015   Unrevealing monitor in May  . Uses hearing aid    bilateral  . Wears dentures    partial upper    Past Surgical History:  Procedure Laterality Date  .  CARDIAC CATHETERIZATION  2008   Nonobstructive CAD  . CHOLECYSTECTOMY    . ELBOW SURGERY    . Event monitor  May 2015   Unrevealing  . HERNIA REPAIR    . KNEE ARTHROSCOPY Left 06/28/2015   Procedure: ARTHROSCOPY KNEE WITH SYNOVECTOMY;  Surgeon: Leanor Kail, MD;  Location: [facility];  Service: Orthopedics;  Laterality: Left;  CPAP  . [place] RESECTION     December 20 14th  . NM MYOVIEW LTD  June 28, 2014   [facility]: [facility] -- no evidence of ischemia or infarction. Normal EF ~58%  . RADIOFREQUENCY ABLATION of Ventricular Tachycardia  July 2000 the   RVOT VT ablation: [facility], [place]    . TRANSTHORACIC ECHOCARDIOGRAM  May 2015   [facility] - Dr. Sharyn Lull: EF 55%, mild LVH; otherwise mostly normal    Allergies: Patient has no known allergies.  Medications: Prior to Admission medications   Medication Sig Start Date End Date Taking? Authorizing Provider  metoprolol succinate (TOPROL-XL) 50 MG 24 hr tablet Take 50 mg by mouth 2 (two) times daily. Take with or immediately following a meal.   Yes Historical Provider, MD  omeprazole (PRILOSEC) 40 MG capsule Take 40 mg by mouth 2 (two) times daily.   Yes Historical Provider, MD  Oxycodone HCl 10 MG TABS Take 1 tablet (10 mg total) by mouth every 6 (six) hours as needed. 02/23/17  Yes Kathlene November  Grayland Ormond, MD  tamsulosin (FLOMAX) 0.4 MG CAPS capsule Take 0.4 mg by mouth daily. PM   Yes Historical Provider, MD  warfarin (COUMADIN) 5 MG tablet Take 5 mg by mouth daily.   Yes Historical Provider, MD  ibuprofen (ADVIL,MOTRIN) 800 MG tablet Take 800 mg by mouth every 8 (eight) hours as needed.    Historical Provider, MD  lactulose (CHRONULAC) 10 GM/15ML solution Take 15 mLs (10 g total) by mouth 3 (three) times daily. 02/25/17   Nicholas Huger, MD  LORATADINE ALLERGY RELIEF PO Take by mouth.    Historical Provider, MD  Multiple Vitamin (MULTIVITAMIN) tablet Take 1  tablet by mouth daily.    Historical Provider, MD  traMADol (ULTRAM) 50 MG tablet Take 1 tablet (50 mg total) by mouth every 6 (six) hours as needed. 02/19/17 02/19/18  Harvest Dark, MD     Family History  Problem Relation Age of Onset  . Breast cancer Mother   . Bone cancer Mother   . COPD Father   . Arrhythmia Father   . Heart failure Father   . Heart attack Father     Social History   Social History  . Marital status: Married    Spouse name: N/A  . Number of children: N/A  . Years of education: N/A   Social History Main Topics  . Smoking status: Former Smoker    Quit date: 11/24/1999  . Smokeless tobacco: Never Used  . Alcohol use No  . Drug use: No  . Sexual activity: Not on file   Other Topics Concern  . Not on file   Social History Narrative   Former smoker who quit over 15 years ago and does not drink alcohol or use drugs.   He is married with 2 children. Lives in [place], [place].  Works as an Chief Financial Officer for [employer].     Review of Systems: A 12 point ROS discussed and pertinent positives are indicated in the HPI above.  All other systems are negative.  Review of Systems  Vital Signs: BP 128/81   Pulse 85   Resp 20   Ht 6' (1.829 m)   Wt 284 lb (128.8 kg)   SpO2 98%   BMI 38.52 kg/m   Physical Exam  Constitutional: He is oriented to person, place, and time. He appears well-developed and well-nourished.  HENT:  Head: Normocephalic and atraumatic.  Cardiovascular: Normal rate and regular rhythm.   Pulmonary/Chest: Effort normal and breath sounds normal.  Neurological: He is alert and oriented to person, place, and time.    Mallampati Score: 1    Imaging: Ct Chest W Contrast  Result Date: 02/19/2017 CLINICAL DATA:  Pt presents to ED c/o pain in the right lower rib cage area since November. Pt states pain at 7/10 at this time; pt states pain has been getting worse over time. Pt with results of abdominal CT that was done in [place]. Attached to pt chart.  Pt states pain 8/10, also reports nausea, denies vomiting, reports mild constipation. Also states he has lost 10lbs in the last week EXAM: CT CHEST WITH CONTRAST TECHNIQUE: Multidetector CT imaging of the chest was performed during intravenous contrast administration. CONTRAST:  91mL ISOVUE-300 IOPAMIDOL (ISOVUE-300) INJECTION 61% COMPARISON:  Chest radiograph, 06/26/2014.  Chest CT, 02/12/2011. FINDINGS: Cardiovascular: No significant vascular findings. Normal heart size. No pericardial effusion. Mediastinum/Nodes: No enlarged mediastinal, hilar, or axillary lymph nodes. Thyroid gland, trachea, and esophagus demonstrate no significant findings. Lungs/Pleura: Minor atelectasis in the right middle  lobe, left upper lobe lingula and posteromedial right lower lobe. No evidence of pneumonia or pulmonary edema. Two small subpleural pulmonary nodules in the left lower lobe, largest measuring 4 mm. No pleural effusion or pneumothorax. Upper Abdomen: Changes from previous gastric surgery. Status post cholecystectomy. No acute findings. Musculoskeletal: There is a lobulated soft tissue mass more confluent group of masses, along the right aspect of the thoracic spine, measuring a combined 8.7 cm from superior inferior by 5 x 1.9 cm transversely. There are several areas of mild, chronic appearing resorption of the adjacent vertebra the mass does not extend into a neural foramen or into the spinal canal. IMPRESSION: 1. Lobulated soft tissue mass along the right thoracic spine, which does not appear to involve the neural foramina or spinal canal. The differential diagnosis includes neurogenic tumors as well as lymphoma. This could be further assessed, if desired clinically, with thoracic MRI with and without contrast. This finding is likely the cause of the patient's right sided chest wall pain. 2. No acute findings within the chest. Electronically Signed   By: Lajean Manes M.D.   On: 02/19/2017 20:03   Nm Pet Image Initial  (pi) Skull Base To Thigh  Result Date: 03/01/2017 CLINICAL DATA:  Initial treatment strategy for lung mass. EXAM: NUCLEAR MEDICINE PET SKULL BASE TO THIGH TECHNIQUE: 12.3 mCi F-18 FDG was injected intravenously. Full-ring PET imaging was performed from the skull base to thigh after the radiotracer. CT data was obtained and used for attenuation correction and anatomic localization. FASTING BLOOD GLUCOSE:  Value: 103 mg/dl COMPARISON:  CT chest 02/19/2017 and 02/12/2011. FINDINGS: NECK No hypermetabolic lymph nodes in the neck. CT images show no acute findings. CHEST Hypermetabolic extrapleural nodules are seen bilaterally, right greater than left. Largest nodule on the right measures 2.2 x 2.9 cm (CT image 126) and is contiguous with nodularity seen inferiorly, with an overall SUV max of 34.3. No hypermetabolic mediastinal, hilar or axillary lymph nodes. No hypermetabolic pulmonary nodules. Heart is enlarged. No pericardial or pleural effusion. ABDOMEN/PELVIS Note is made of misregistration artifact in the abdomen. No abnormal hypermetabolism in the liver, adrenal glands, spleen or pancreas. There are scattered hypermetabolic peritoneal nodules with an index nodule in the right lower quadrant measuring 1.7 cm (CT image 213) and SUV max of 20.8. Hypermetabolic left periaortic lymph node is seen just above the bifurcation, measuring 2.6 x 2.8 cm with an SUV max of 28.1. Right inguinal lymph node measures 10 mm (CT image 305) with an SUV max of 7.0. 8 mm subcutaneous nodule along the right lateral ventral abdominal wall (CT image 218) is mildly hypermetabolic. Liver, adrenal glands, kidneys, spleen and pancreas are grossly unremarkable. Cholecystectomy. Postoperative changes in the stomach. Bowel is grossly unremarkable. SKELETON No abnormal osseous hypermetabolism. IMPRESSION: 1. Hypermetabolic extrapleural lesions, peritoneal nodules and retroperitoneal/right inguinal lymph nodes, worrisome for lymphoma. 2. Mildly  hypermetabolic subcutaneous nodule along the lower right lateral abdominal wall, nonspecific. Continued attention on followup exams is warranted. Electronically Signed   By: Lorin Picket M.D.   On: 03/01/2017 11:35    Labs:  CBC:  Recent Labs  05/28/16 1411 02/19/17 1805  WBC 6.0 7.5  HGB  --  15.0  HCT 41.1 44.6  PLT 251 258    COAGS: No results for input(s): INR, APTT in the last 8760 hours.  BMP:  Recent Labs  05/29/16 1517 02/19/17 1805  NA 140 139  K 4.0 3.8  CL 107 106  CO2 26 29  GLUCOSE  87 102*  BUN 29* 16  CALCIUM 9.2 9.5  CREATININE 1.01 1.11  GFRNONAA >60 >60  GFRAA >60 >60    LIVER FUNCTION TESTS:  Recent Labs  02/19/17 1805  BILITOT 0.7  AST 23  ALT 19  ALKPHOS 91  PROT 7.7  ALBUMIN 4.3    TUMOR MARKERS: No results for input(s): AFPTM, CEA, CA199, CHROMGRNA in the last 8760 hours.  Assessment and Plan:  Multiple masses. CT guided thoracic paraspinal mass biopsy to follow.  Thank you for this interesting consult.  I greatly enjoyed meeting TRISTAN PROTO and look forward to participating in their care.  A copy of this report was sent to the requesting provider on this date.  Electronically Signed: Nyiah Pianka, ART A 03/02/2017, 1:14 PM   I spent a total of  40 Minutes   in face to face in clinical consultation, greater than 50% of which was counseling/coordinating care for CT guided paraspinal mass biopsy.

## 2017-03-02 NOTE — Discharge Instructions (Signed)
Needle Biopsy, Care After °Refer to this sheet in the next few weeks. These instructions provide you with information about caring for yourself after your procedure. Your health care provider may also give you more specific instructions. Your treatment has been planned according to current medical practices, but problems sometimes occur. Call your health care provider if you have any problems or questions after your procedure. °What can I expect after the procedure? °After your procedure, it is common to have soreness, bruising, or mild pain at the biopsy site. This should go away in a few days. °Follow these instructions at home: °· Rest as directed by your health care provider. °· Take medicines only as directed by your health care provider. °· There are many different ways to close and cover the biopsy site, including stitches (sutures), skin glue, and adhesive strips. Follow your health care provider's instructions about: °¨ Biopsy site care. °¨ Bandage (dressing) changes and removal. °¨ Biopsy site closure removal. °· Check your biopsy site every day for signs of infection. Watch for: °¨ Redness, swelling, or pain. °¨ Fluid, blood, or pus. °Contact a health care provider if: °· You have a fever. °· You have redness, swelling, or pain at the biopsy site that lasts longer than a few days. °· You have fluid, blood, or pus coming from the biopsy site. °· You feel nauseous. °· You vomit. °Get help right away if: °· You have shortness of breath. °· You have trouble breathing. °· You have chest pain. °· You feel dizzy or you faint. °· You have bleeding that does not stop with pressure or a bandage. °· You cough up blood. °· You have pain in your abdomen. °This information is not intended to replace advice given to you by your health care provider. Make sure you discuss any questions you have with your health care provider. °Document Released: 03/26/2015 Document Revised: 04/16/2016 Document Reviewed:  11/05/2014 °Elsevier Interactive Patient Education © 2017 [employer] ° °

## 2017-03-02 NOTE — Telephone Encounter (Signed)
Patient wife Juliann Pulse walked in to clinic today. She was needing a note for patient to be able to work from home while undergoing treatment. This note has been given to patient wife. She was asking about the staging of his "cancer". Ulla Potash that from our PET scan report  Dr. Woodfin Ganja and Dr. Joaquin Courts call) both say this could be lymphoma but unsure until all of the testing results are back. Patient and wife have appointment April 17 to come and speak with doctor about results of PET scan and CT Biopsy. Patient wife understood and will await results and appointment to get a Final diagnosis and plan of treatment.

## 2017-03-02 NOTE — Procedures (Signed)
Paraspinal mass Bx EBL 0 Comp 0

## 2017-03-03 ENCOUNTER — Telehealth: Payer: Self-pay | Admitting: *Deleted

## 2017-03-03 MED ORDER — ONDANSETRON HCL 4 MG PO TABS: 4.0000 mg | ORAL_TABLET | ORAL | 0 refills | Status: DC | PRN

## 2017-03-03 NOTE — Telephone Encounter (Signed)
Called Dr B to report that he is having n/v, not sure if it is post bx or a stomach bug. Per Dr B Zofran to be called in. Juliann Pulse notified of Rx sent in to [facility] [place]

## 2017-03-04 ENCOUNTER — Other Ambulatory Visit: Payer: Self-pay

## 2017-03-04 ENCOUNTER — Other Ambulatory Visit: Payer: 59

## 2017-03-04 ENCOUNTER — Emergency Department
Admission: EM | Admit: 2017-03-04 | Discharge: 2017-03-04 | Disposition: A | Discharge status: Home / Self Care | Payer: 59 | Attending: Emergency Medicine | Admitting: Emergency Medicine

## 2017-03-04 ENCOUNTER — Emergency Department: Payer: 59

## 2017-03-04 ENCOUNTER — Encounter: Payer: Self-pay | Admitting: Emergency Medicine

## 2017-03-04 DIAGNOSIS — I2510 Atherosclerotic heart disease of native coronary artery without angina pectoris: Secondary | ICD-10-CM | POA: Diagnosis not present

## 2017-03-04 DIAGNOSIS — Z7901 Long term (current) use of anticoagulants: Secondary | ICD-10-CM | POA: Insufficient documentation

## 2017-03-04 DIAGNOSIS — I480 Paroxysmal atrial fibrillation: Secondary | ICD-10-CM | POA: Diagnosis not present

## 2017-03-04 DIAGNOSIS — Z79899 Other long term (current) drug therapy: Secondary | ICD-10-CM | POA: Diagnosis not present

## 2017-03-04 DIAGNOSIS — R531 Weakness: Secondary | ICD-10-CM | POA: Diagnosis present

## 2017-03-04 DIAGNOSIS — Z87891 Personal history of nicotine dependence: Secondary | ICD-10-CM | POA: Diagnosis not present

## 2017-03-04 DIAGNOSIS — I4891 Unspecified atrial fibrillation: Secondary | ICD-10-CM

## 2017-03-04 LAB — BASIC METABOLIC PANEL
Anion gap: 6 (ref 5–15)
BUN: 13 mg/dL (ref 6–20)
CHLORIDE: 104 mmol/L (ref 101–111)
CO2: 26 mmol/L (ref 22–32)
CREATININE: 0.82 mg/dL (ref 0.61–1.24)
Calcium: 9.2 mg/dL (ref 8.9–10.3)
GFR calc Af Amer: >60 mL/min (ref >60)
Glucose, Bld: 111 mg/dL — ABNORMAL HIGH (ref 65–99)
Potassium: 4.2 mmol/L (ref 3.5–5.1)
SODIUM: 136 mmol/L (ref 135–145)

## 2017-03-04 LAB — CBC
HCT: 46.4 % (ref 40.0–52.0)
Hemoglobin: 15.4 g/dL (ref 13.0–18.0)
MCH: 28.6 pg (ref 26.0–34.0)
MCHC: 33.3 g/dL (ref 32.0–36.0)
MCV: 85.9 fL (ref 80.0–100.0)
PLATELETS: 324 10*3/uL (ref 150–440)
RBC: 5.4 MIL/uL (ref 4.40–5.90)
RDW: 14.4 % (ref 11.5–14.5)
WBC: 8.2 10*3/uL (ref 3.8–10.6)

## 2017-03-04 LAB — PROTIME-INR
INR: 1.11
PROTHROMBIN TIME: 14.3 s (ref 11.4–15.2)

## 2017-03-04 LAB — TSH: TSH: 1.495 u[IU]/mL (ref 0.350–4.500)

## 2017-03-04 LAB — TROPONIN I: Troponin I: <0.03 ng/mL (ref <0.03)

## 2017-03-04 MED ORDER — SODIUM CHLORIDE 0.9 % IV BOLUS (SEPSIS)
1000.0000 mL | Freq: Once | INTRAVENOUS | Status: AC
  Administered 2017-03-04: 1000 mL via INTRAVENOUS

## 2017-03-04 MED ORDER — ONDANSETRON HCL 4 MG/2ML IJ SOLN
4.0000 mg | Freq: Once | INTRAMUSCULAR | Status: AC
  Administered 2017-03-04: 4 mg via INTRAVENOUS
  Filled 2017-03-04: qty 2

## 2017-03-04 NOTE — Discharge Instructions (Signed)
Please continue taking all of your home medications as prescribed and follow-up with your primary care physician as scheduled. Return to the emergency department for any concerns such as chest pain, shortness of breath, or for palpitations.  It was a pleasure to take care of you today, and thank you for coming to our emergency department.  If you have any questions or concerns before leaving please ask the nurse to grab me and I'm more than happy to go through your aftercare instructions again.  If you were prescribed any opioid pain medication today such as Norco, Vicodin, Percocet, morphine, hydrocodone, or oxycodone please make sure you do not drive when you are taking this medication as it can alter your ability to drive safely.  If you have any concerns once you are home that you are not improving or are in fact getting worse before you can make it to your follow-up appointment, please do not hesitate to call 911 and come back for further evaluation.  Darel Hong MD  Results for orders placed or performed during the hospital encounter of 16/94/50  Basic metabolic panel  Result Value Ref Range   Sodium 136 135 - 145 mmol/L   Potassium 4.2 3.5 - 5.1 mmol/L   Chloride 104 101 - 111 mmol/L   CO2 26 22 - 32 mmol/L   Glucose, Bld 111 (H) 65 - 99 mg/dL   BUN 13 6 - 20 mg/dL   Creatinine, Ser 0.82 0.61 - 1.24 mg/dL   Calcium 9.2 8.9 - 10.3 mg/dL   GFR calc non Af Amer >60 >60 mL/min   GFR calc Af Amer >60 >60 mL/min   Anion gap 6 5 - 15  CBC  Result Value Ref Range   WBC 8.2 3.8 - 10.6 K/uL   RBC 5.40 4.40 - 5.90 MIL/uL   Hemoglobin 15.4 13.0 - 18.0 g/dL   HCT 46.4 40.0 - 52.0 %   MCV 85.9 80.0 - 100.0 fL   MCH 28.6 26.0 - 34.0 pg   MCHC 33.3 32.0 - 36.0 g/dL   RDW 14.4 11.5 - 14.5 %   Platelets 324 150 - 440 K/uL

## 2017-03-04 NOTE — ED Notes (Signed)
Pt up to restroom with steady gait. Denies dizziness or nausea at this time. given sandwich tray per MD request. Alert and sitting up on stretcher. No distress noted.

## 2017-03-04 NOTE — ED Provider Notes (Signed)
[facility] Emergency Department Provider Note  ____________________________________________   First MD Initiated Contact with Patient 03/04/17 1848     (approximate)  I have reviewed the triage vital signs and the nursing notes.   HISTORY  Chief Complaint Palpitations    HPI Nicholas Reynolds is a 66 y.o. male who resents to the emergency department via EMS for weakness and palpitations that began today. He has a long-standing history of atrial fibrillation but he is not permanently in atrial fibrillation. 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 He is pending his results and will have them early next week. He denies fevers or chills. He does report generalized fatigue. He was advised to stop taking his metoprolol and advance of the CT-guided biopsy. He has not taken his metoprolol and about a week.   Past Medical History:  Diagnosis Date  . Arthritis    thumbs  . Asymptomatic varicose veins of bilateral lower extremities    Uncomfortable but in no ulcers  . Complication of anesthesia    slow to wake after hernia surg.  was taking "a bunch" of herbals at that time  . Coronary artery disease, non-occlusive July 2008   Nonobstructive by cath   . Family history of premature coronary artery disease    Father  . GERD (gastroesophageal reflux disease)   . 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 Obstructive sleep apnea    severe  per study 09/05/14   . Paroxysmal atrial fibrillation ([place]) 2002   a) Initially diagnosed as [place] Fib while in [place] (on ASA & BB after) - was on warfarin for a couple years and a Flecainide Pill-in Pocket PRN; b) recurrent A. fib with RVR August 2050 - status post cardioversion with flecainide, on low-dose beta blocker  . Pre-syncope May 2015   Unrevealing monitor in May  . Uses hearing aid    bilateral  . Wears dentures    partial upper    Patient Active Problem List   Diagnosis Date Noted  . 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 Obstructive sleep apnea   . Family history of premature coronary artery disease   . Obesity (BMI 30-39.9) 12/05/2014  . Orthostatic dizziness 12/05/2014  . Paroxysmal atrial fibrillation ([place]) 07/15/2014  . Varicose veins of lower extremity with edema 07/15/2014  . Fatigue 07/15/2014  . H/O ventricular tachycardia 05/24/2007  . Coronary artery disease, non-occlusive 05/24/2007    Past Surgical History:  Procedure Laterality Date  . CARDIAC CATHETERIZATION  2008   Nonobstructive CAD  . CHOLECYSTECTOMY    . ELBOW SURGERY    . Event monitor  May 2015   Unrevealing  . HERNIA REPAIR    . KNEE ARTHROSCOPY Left 06/28/2015   Procedure: ARTHROSCOPY KNEE WITH SYNOVECTOMY;  Surgeon: Leanor Kail, MD;  Location: [facility];  Service: Orthopedics;  Laterality: Left;  CPAP  . [place] RESECTION     December 20 14th  . NM MYOVIEW  LTD  June 28, 2014   [facility]: [facility] -- no evidence of ischemia or infarction. Normal EF ~58%  . RADIOFREQUENCY ABLATION of Ventricular Tachycardia  July 2000 the   RVOT VT ablation: [facility], [place]    . TRANSTHORACIC ECHOCARDIOGRAM  May 2015   [facility] - Dr. Sharyn Lull: EF 55%, mild LVH; otherwise mostly normal    Prior to Admission medications   Medication Sig Start Date End Date Taking? Authorizing Provider  ibuprofen (ADVIL,MOTRIN) 800 MG  tablet Take 800 mg by mouth every 8 (eight) hours as needed.    Historical Provider, MD  lactulose (CHRONULAC) 10 GM/15ML solution Take 15 mLs (10 g total) by mouth 3 (three) times daily. 02/25/17   Lloyd Huger, MD  LORATADINE ALLERGY RELIEF PO Take by mouth.    Historical Provider, MD  metoprolol succinate (TOPROL-XL) 50 MG 24 hr tablet Take 50 mg by mouth 2 (two) times daily. Take with or immediately following a meal.    Historical Provider, MD  Multiple Vitamin (MULTIVITAMIN) tablet Take 1 tablet by mouth daily.    Historical Provider, MD  omeprazole (PRILOSEC) 40 MG capsule Take 40 mg by mouth 2 (two) times daily.    Historical Provider, MD  ondansetron (ZOFRAN) 4 MG tablet Take 1 tablet (4 mg total) by mouth every 4 (four) hours as needed for nausea or vomiting. 03/03/17   Cammie Sickle, MD  Oxycodone HCl 10 MG TABS Take 1 tablet (10 mg total) by mouth every 6 (six) hours as needed. 02/23/17   Lloyd Huger, MD  tamsulosin (FLOMAX) 0.4 MG CAPS capsule Take 0.4 mg by mouth daily. PM    Historical Provider, MD  traMADol (ULTRAM) 50 MG tablet Take 1 tablet (50 mg total) by mouth every 6 (six) hours as needed. 02/19/17 02/19/18  Harvest Dark, MD  warfarin (COUMADIN) 5 MG tablet Take 5 mg by mouth daily.    Historical Provider, MD    Allergies Patient has no known allergies.  Family History  Problem Relation Age of Onset  . Breast cancer Mother   . Bone cancer Mother   . COPD Father   . Arrhythmia Father   . Heart failure Father   . Heart attack Father     Social History Social History  Substance Use Topics  . Smoking status: Former Smoker    Quit date: 11/24/1999  . Smokeless tobacco: Never Used  . Alcohol use No    Review of Systems Constitutional: No fever/chills Eyes: No visual changes. ENT: No sore throat. Cardiovascular: Positive chest pain. Respiratory: Denies shortness of breath. Gastrointestinal: No abdominal pain.  No nausea, no vomiting.  No  diarrhea.  No constipation. Genitourinary: Negative for dysuria. Musculoskeletal: Negative for back pain. Skin: Negative for rash. Neurological: Negative for headaches, focal weakness or numbness.  10-point ROS otherwise negative.  ____________________________________________   PHYSICAL EXAM:  VITAL SIGNS: ED Triage Vitals  Enc Vitals Group     BP 03/04/17 1823 99/64     Pulse Rate 03/04/17 1823 (!) 112     Resp 03/04/17 1823 18     Temp 03/04/17 1823 97.7 F (36.5 C)     Temp Source 03/04/17 1823 Oral     SpO2 03/04/17 1823 97 %     Weight 03/04/17 1824 280 lb (127 kg)     Height 03/04/17 1824 6\' 5"  (1.956 m)     Head Circumference --  Peak Flow --      Pain Score 03/04/17 1822 0     Pain Loc --      Pain Edu? --      Excl. in [place]? --     Constitutional: Alert and oriented x 4 well appearing nontoxic no diaphoresis speaks in full, clear sentences Eyes: PERRL EOMI. Head: Atraumatic. Nose: No congestion/rhinnorhea. Mouth/Throat: No trismus Neck: No stridor.   Cardiovascular:Irregularly irregular and tachycardic. Grossly normal heart sounds.  Good peripheral circulation. Respiratory: Normal respiratory effort.  No retractions. Lungs CTAB and moving good air Gastrointestinal: Soft nondistended nontender no rebound no guarding no peritonitis no McBurney's tenderness negative Rovsing's no costovertebral tenderness negative Murphy's Musculoskeletal: No lower extremity edema   Neurologic:  Normal speech and language. No gross focal neurologic deficits are appreciated. Skin:  Skin is warm, dry and intact. No rash noted. Psychiatric: Somewhat anxious appearing with sad affect    ____________________________________________   DIFFERENTIAL  Atrial fibrillation with rapid ventricular response, acute coronary syndrome, metabolic derangement, pulmonary embolism ____________________________________________   LABS (all labs ordered are listed, but only abnormal results are  displayed)  Labs Reviewed  BASIC METABOLIC PANEL - Abnormal; Notable for the following:       Result Value   Glucose, Bld 111 (*)    All other components within normal limits  CBC  TSH  TROPONIN I  PROTIME-INR    No signs of acute ischemia __________________________________________  EKG  ED ECG REPORT I, Darel Hong, the attending physician, personally viewed and interpreted this ECG.   ED ECG REPORT I, Darel Hong, the attending physician, personally viewed and interpreted this ECG.  Date: 03/04/2017 EKG Time: 1843 Rate: 120 Rhythm: Atrial fibrillation with rapid ventricular response QRS Axis: normal Intervals: normal ST/T Wave abnormalities: normal Conduction Disturbances: none Narrative Interpretation: Abnormal   Date: 03/04/2017 EKG Time: 1917 Rate: 64 Rhythm: normal sinus rhythm QRS Axis: normal Intervals: normal ST/T Wave abnormalities: normal Conduction Disturbances: none Narrative Interpretation: unremarkable  ____________________________________________  RADIOLOGY  Chest x-ray showing IMPRESSION: Streaky bibasilar atelectasis but no definite infiltrates or effusions.   PROCEDURES  Procedure(s) performed: no  Procedures  Critical Care performed: no  ____________________________________________   INITIAL IMPRESSION / ASSESSMENT AND PLAN / ED COURSE  Pertinent labs & imaging results that were available during my care of the patient were reviewed by me and considered in my medical decision making (see chart for details).  The patient arrives with atrial fibrillation and low-grade rapid ventricular response. Differential is broad but may just simply represent returning to atrial fibrillation secondary to cessation of his medications for his biopsy.    ----------------------------------------- 7:15 PM on 03/04/2017 -----------------------------------------  While I was talking with the patient he spontaneously converted back to  sinus rhythm.He feels significantly improved and was able to get up and walk around and even drink. At this point I have recommended he restart his medications and follow-up with his primary care physician as scheduled. He and his wife understand and agree with the plan.  ____________________________________________   FINAL CLINICAL IMPRESSION(S) / ED DIAGNOSES  Final diagnoses:  Atrial fibrillation with RVR ([place])  Weakness      NEW MEDICATIONS STARTED DURING THIS VISIT:  Discharge Medication List as of 03/04/2017  7:44 PM       Note:  This document was prepared using Dragon voice recognition software and may include unintentional dictation errors.     Darel Hong, MD 03/05/17 1435

## 2017-03-04 NOTE — ED Triage Notes (Signed)
Pt in via EMS with complaints of sudden onset chest palpitations today while at rest.  Pt with hx of Afibb, has recently been taken off of Coumadin and Metoprolol x 5 days for spinal biopsy.  Pt denies any chest pain, just states, "I feel like my heart is pounding out of my chest."  Pt A/Ox4, NAD noted at this time.

## 2017-03-08 NOTE — Progress Notes (Signed)
[place]  [phone] [phone]  ID: Nicholas Reynolds OB: February 05, 1951  MR#: [id number]  QMV#:[id number]  Patient Care Team: Valera Castle, MD as PCP - General  CHIEF COMPLAINT: DLBCL.  INTERVAL HISTORY: Patient returns to clinic today for further evaluation, discussion of his pathology results, and treatment planning. He continues to have abdominal and flank pain, but otherwise feels well. He continues to be anxious.  He also admits to some unintentional weight loss over the past several months. He has constipation. He denies any fevers or illnesses. He has no neurologic complaints. He denies any shortness of breath, cough, or hemoptysis. He has no nausea, vomiting, or diarrhea. He has no urinary complaints. Patient otherwise feels well and offers no further specific complaints.  REVIEW OF SYSTEMS:   Review of Systems  Constitutional: Positive for weight loss. Negative for fever and malaise/fatigue.  Respiratory: Negative for cough, hemoptysis and shortness of breath.   Cardiovascular: Positive for chest pain. Negative for leg swelling.  Gastrointestinal: Positive for abdominal pain and constipation. Negative for blood in stool, diarrhea, melena, nausea and vomiting.  Genitourinary: Negative.   Musculoskeletal: Positive for back pain.  Neurological: Negative.  Negative for weakness.  Psychiatric/Behavioral: The patient is nervous/anxious and has insomnia.     As per HPI. Otherwise, a complete review of systems is negative.  PAST MEDICAL HISTORY: Past Medical History:  Diagnosis Date  . Arthritis    thumbs  . Asymptomatic varicose veins of bilateral lower extremities    Uncomfortable but in no ulcers  . Complication of anesthesia    slow to wake after hernia surg.  was taking "a bunch" of herbals at that time  . Coronary artery disease, non-occlusive July 2008   Nonobstructive by cath   . Family history of premature coronary  artery disease    Father  . GERD (gastroesophageal reflux disease)   . 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 Obstructive sleep apnea    severe per study 09/05/14   . Paroxysmal atrial fibrillation () 2002   a) Initially diagnosed as [place] Fib while in [place] (on ASA & BB after) - was on warfarin for a couple years and a Flecainide Pill-in Pocket PRN; b) recurrent A. fib with RVR August 2050 - status post cardioversion with flecainide, on low-dose beta blocker  . Pre-syncope May 2015   Unrevealing monitor in May  . Uses hearing aid    bilateral  . Wears dentures    partial upper    PAST SURGICAL HISTORY: Past Surgical History:  Procedure Laterality Date  . CARDIAC CATHETERIZATION  2008   Nonobstructive CAD  . CHOLECYSTECTOMY    . ELBOW SURGERY    . Event monitor  May 2015   Unrevealing  . HERNIA REPAIR    . KNEE ARTHROSCOPY Left 06/28/2015   Procedure: ARTHROSCOPY KNEE WITH SYNOVECTOMY;  Surgeon: Leanor Kail, MD;  Location: [facility];  Service: Orthopedics;  Laterality: Left;  CPAP  . [place] RESECTION     December 20 14th  . NM MYOVIEW LTD  June 28, 2014   [facility]: [facility] -- no evidence of ischemia or infarction. Normal EF ~58%  . RADIOFREQUENCY ABLATION of  Ventricular Tachycardia  July 2000 the   RVOT VT ablation: [facility] [place]    . TRANSTHORACIC ECHOCARDIOGRAM  May 2015   [facility] - Dr. Sharyn Lull: EF 55%, mild LVH; otherwise mostly normal    FAMILY HISTORY: Family History  Problem Relation Age of Onset  . Breast cancer Mother   . Bone cancer Mother   . COPD Father     . Arrhythmia Father   . Heart failure Father   . Heart attack Father     ADVANCED DIRECTIVES (Y/N):  N  HEALTH MAINTENANCE: Social History  Substance Use Topics  . Smoking status: Former Smoker    Quit date: 11/24/1999  . Smokeless tobacco: Never Used  . Alcohol use No     Colonoscopy:  PAP:  Bone density:  Lipid panel:  No Known Allergies  Current Outpatient Prescriptions  Medication Sig Dispense Refill  . metoprolol succinate (TOPROL-XL) 50 MG 24 hr tablet Take 50 mg by mouth 2 (two) times daily. Take with or immediately following a meal.    . Multiple Vitamin (MULTIVITAMIN) tablet Take 1 tablet by mouth daily.    Marland Kitchen omeprazole (PRILOSEC) 40 MG capsule Take 40 mg by mouth 2 (two) times daily.    . ondansetron (ZOFRAN) 4 MG tablet Take 1 tablet (4 mg total) by mouth every 4 (four) hours as needed for nausea or vomiting. 45 tablet 0  . Oxycodone HCl 10 MG TABS Take 1 tablet (10 mg total) by mouth every 6 (six) hours as needed. 90 tablet 0  . tamsulosin (FLOMAX) 0.4 MG CAPS capsule Take 0.4 mg by mouth daily. PM    . warfarin (COUMADIN) 5 MG tablet Take 5 mg by mouth daily. Mon and Fri 7.5 mg, all other days 5 mg     No current facility-administered medications for this visit.    Facility-Administered Medications Ordered in Other Visits  Medication Dose Route Frequency Provider Last Rate Last Dose  . ceFAZolin (ANCEF) IVPB 1 g/50 mL premix  1 g Intravenous Once [employer], PA-C        OBJECTIVE: Vitals:   03/09/17 1501  BP: 130/78  Pulse: (!) 196  Temp: 98.2 F (36.8 C)     Body mass index is 33.12 kg/m.    ECOG FS:1 - Symptomatic but completely ambulatory  General: Well-developed, well-nourished, no acute distress. Eyes: Pink conjunctiva, anicteric sclera. Lungs: Clear to auscultation bilaterally. Heart: Regular rate and rhythm. No rubs, murmurs, or gallops. Abdomen: Soft, nontender, nondistended. No organomegaly noted, normoactive bowel  sounds. Musculoskeletal: No edema, cyanosis, or clubbing. Neuro: Alert, answering all questions appropriately. Cranial nerves grossly intact. Skin: No rashes or petechiae noted. Psych: Normal affect. Lymphatics: No cervical, calvicular, axillary or inguinal LAD.   LAB RESULTS:  Lab Results  Component Value Date   NA 136 03/04/2017   K 4.2 03/04/2017   CL 104 03/04/2017   CO2 26 03/04/2017   GLUCOSE 111 (H) 03/04/2017   BUN 13 03/04/2017   CREATININE 0.82 03/04/2017   CALCIUM 9.2 03/04/2017   PROT 7.7 02/19/2017   ALBUMIN 4.3 02/19/2017   AST 23 02/19/2017   ALT 19 02/19/2017   ALKPHOS 91 02/19/2017   BILITOT 0.7 02/19/2017   GFRNONAA >60 03/04/2017   GFRAA >60 03/04/2017    Lab Results  Component Value Date   WBC 8.2 03/04/2017   NEUTROABS 4.0 05/28/2016   HGB 15.4 03/04/2017   HCT 46.4 03/04/2017  MCV 85.9 03/04/2017   PLT 324 03/04/2017     STUDIES: Dg Chest 2 View  Result Date: 03/04/2017 CLINICAL DATA:  Tachycardia.  History of atrial fibrillation. EXAM: CHEST  2 VIEW COMPARISON:  Chest CT 02/19/2017 and chest x-ray 06/26/2014 FINDINGS: The cardiac silhouette, mediastinal and hilar contours are within normal limits and stable. There is mild tortuosity of the thoracic aorta. Streaky bibasilar atelectasis but no definite infiltrates or effusions. Right paraspinal lesions seen on recent CT scan are difficult to identify on the chest x-ray. IMPRESSION: Streaky bibasilar atelectasis but no definite infiltrates or effusions. Electronically Signed   By: Marijo Sanes M.D.   On: 03/04/2017 19:12   Ct Chest W Contrast  Result Date: 02/19/2017 CLINICAL DATA:  Pt presents to ED c/o pain in the right lower rib cage area since November. Pt states pain at 7/10 at this time; pt states pain has been getting worse over time. Pt with results of abdominal CT that was done in [place]. Attached to pt chart. Pt states pain 8/10, also reports nausea, denies vomiting, reports mild  constipation. Also states he has lost 10lbs in the last week EXAM: CT CHEST WITH CONTRAST TECHNIQUE: Multidetector CT imaging of the chest was performed during intravenous contrast administration. CONTRAST:  38m ISOVUE-300 IOPAMIDOL (ISOVUE-300) INJECTION 61% COMPARISON:  Chest radiograph, 06/26/2014.  Chest CT, 02/12/2011. FINDINGS: Cardiovascular: No significant vascular findings. Normal heart size. No pericardial effusion. Mediastinum/Nodes: No enlarged mediastinal, hilar, or axillary lymph nodes. Thyroid gland, trachea, and esophagus demonstrate no significant findings. Lungs/Pleura: Minor atelectasis in the right middle lobe, left upper lobe lingula and posteromedial right lower lobe. No evidence of pneumonia or pulmonary edema. Two small subpleural pulmonary nodules in the left lower lobe, largest measuring 4 mm. No pleural effusion or pneumothorax. Upper Abdomen: Changes from previous gastric surgery. Status post cholecystectomy. No acute findings. Musculoskeletal: There is a lobulated soft tissue mass more confluent group of masses, along the right aspect of the thoracic spine, measuring a combined 8.7 cm from superior inferior by 5 x 1.9 cm transversely. There are several areas of mild, chronic appearing resorption of the adjacent vertebra the mass does not extend into a neural foramen or into the spinal canal. IMPRESSION: 1. Lobulated soft tissue mass along the right thoracic spine, which does not appear to involve the neural foramina or spinal canal. The differential diagnosis includes neurogenic tumors as well as lymphoma. This could be further assessed, if desired clinically, with thoracic MRI with and without contrast. This finding is likely the cause of the patient's right sided chest wall pain. 2. No acute findings within the chest. Electronically Signed   By: DLajean ManesM.D.   On: 02/19/2017 20:03   Nm Pet Image Initial (pi) Skull Base To Thigh  Result Date: 03/01/2017 CLINICAL DATA:  Initial  treatment strategy for lung mass. EXAM: NUCLEAR MEDICINE PET SKULL BASE TO THIGH TECHNIQUE: 12.3 mCi F-18 FDG was injected intravenously. Full-ring PET imaging was performed from the skull base to thigh after the radiotracer. CT data was obtained and used for attenuation correction and anatomic localization. FASTING BLOOD GLUCOSE:  Value: 103 mg/dl COMPARISON:  CT chest 02/19/2017 and 02/12/2011. FINDINGS: NECK No hypermetabolic lymph nodes in the neck. CT images show no acute findings. CHEST Hypermetabolic extrapleural nodules are seen bilaterally, right greater than left. Largest nodule on the right measures 2.2 x 2.9 cm (CT image 126) and is contiguous with nodularity seen inferiorly, with an overall SUV max of 34.3. No hypermetabolic  mediastinal, hilar or axillary lymph nodes. No hypermetabolic pulmonary nodules. Heart is enlarged. No pericardial or pleural effusion. ABDOMEN/PELVIS Note is made of misregistration artifact in the abdomen. No abnormal hypermetabolism in the liver, adrenal glands, spleen or pancreas. There are scattered hypermetabolic peritoneal nodules with an index nodule in the right lower quadrant measuring 1.7 cm (CT image 213) and SUV max of 20.8. Hypermetabolic left periaortic lymph node is seen just above the bifurcation, measuring 2.6 x 2.8 cm with an SUV max of 28.1. Right inguinal lymph node measures 10 mm (CT image 305) with an SUV max of 7.0. 8 mm subcutaneous nodule along the right lateral ventral abdominal wall (CT image 218) is mildly hypermetabolic. Liver, adrenal glands, kidneys, spleen and pancreas are grossly unremarkable. Cholecystectomy. Postoperative changes in the stomach. Bowel is grossly unremarkable. SKELETON No abnormal osseous hypermetabolism. IMPRESSION: 1. Hypermetabolic extrapleural lesions, peritoneal nodules and retroperitoneal/right inguinal lymph nodes, worrisome for lymphoma. 2. Mildly hypermetabolic subcutaneous nodule along the lower right lateral abdominal  wall, nonspecific. Continued attention on followup exams is warranted. Electronically Signed   By: Lorin Picket M.D.   On: 03/01/2017 11:35   Ct Biopsy  Result Date: 03/02/2017 INDICATION: Thoracic right paraspinal mass EXAM: CT BIOPSY MEDICATIONS: None. ANESTHESIA/SEDATION: Fentanyl 100 mcg IV; Versed 2 mg IV Moderate Sedation Time:  12 The patient was continuously monitored during the procedure by the interventional radiology nurse under my direct supervision. FLUOROSCOPY TIME:  None. COMPLICATIONS: None immediate. PROCEDURE: Informed written consent was obtained from the patient after a thorough discussion of the procedural risks, benefits and alternatives. All questions were addressed. Maximal Sterile Barrier Technique was utilized including caps, mask, sterile gowns, sterile gloves, sterile drape, hand hygiene and skin antiseptic. A timeout was performed prior to the initiation of the procedure. Under CT guidance, a(n) 17 gauge guide needle was advanced into the right thoracic paraspinal mass. Subsequently 6 18 gauge core biopsies were obtained. The guide needle was removed. Post biopsy images demonstrate no hemorrhage. Patient tolerated the procedure well without complication. Vital sign monitoring by nursing staff during the procedure will continue as patient is in the special procedures unit for post procedure observation. FINDINGS: The images document guide needle placement within the right thoracic paraspinal mass. Post biopsy images demonstrate no hemorrhage. IMPRESSION: Successful CT-guided right paraspinal thoracic mass core biopsy. Electronically Signed   By: Marybelle Killings M.D.   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   PLAN:    1. DLBCL: PET scan results reviewed independently and reported as above confirming at least stage III disease. 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 Pathology has been sent out to confirm diagnosis and  final reading is not available. Patient will require chemotherapy in the near future. Prior to initiating treatment will get a bone marrow biopsy to complete the staging workup and a MUGA scan. Plan on using dose dense R CHOP treatment every 2 weeks for 4-6 cycles. Return to clinic on March 18, 2017 for consideration of cycle 1 pending final pathology.  2. Atrial fibrillation: Patient underwent RFA on December 21, 2016. He also had a negative nuclear medicine cardiac stress test on January 13, 2017. 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 Patient will require cardiac follow-up in [place].  3. Pain: Agent was given a prescription for oxycodone 10 mg tabs every 6 hours as needed today. 4. Constipation: Patient was given a prescription for lactulose.  Approximately 30 minutes  was spent in discussion of which greater than 50% was consultation.  Patient expressed understanding and was in agreement with this plan. He also understands that He can call clinic at any time with any questions, concerns, or complaints.   Cancer Staging DLBCL (diffuse large B cell lymphoma) ([place]) Staging form: Hodgkin and Non-Hodgkin Lymphoma, AJCC 8th Edition - Clinical stage from 03/15/2017: Stage III (Diffuse large B-cell lymphoma) - Signed by Lloyd Huger, MD on 03/15/2017   Lloyd Huger, MD   03/15/2017 9:52 AM

## 2017-03-09 ENCOUNTER — Encounter: Payer: Self-pay | Admitting: Oncology

## 2017-03-09 ENCOUNTER — Inpatient Hospital Stay (HOSPITAL_BASED_OUTPATIENT_CLINIC_OR_DEPARTMENT_OTHER): Payer: 59 | Admitting: Oncology

## 2017-03-09 DIAGNOSIS — K5900 Constipation, unspecified: Secondary | ICD-10-CM

## 2017-03-09 DIAGNOSIS — R1900 Intra-abdominal and pelvic swelling, mass and lump, unspecified site: Secondary | ICD-10-CM

## 2017-03-09 DIAGNOSIS — I472 Ventricular tachycardia, unspecified: Secondary | ICD-10-CM | POA: Insufficient documentation

## 2017-03-09 DIAGNOSIS — Z7901 Long term (current) use of anticoagulants: Secondary | ICD-10-CM | POA: Diagnosis not present

## 2017-03-09 DIAGNOSIS — C8338 Diffuse large B-cell lymphoma, lymph nodes of multiple sites: Secondary | ICD-10-CM

## 2017-03-09 DIAGNOSIS — Z79899 Other long term (current) drug therapy: Secondary | ICD-10-CM | POA: Diagnosis not present

## 2017-03-09 DIAGNOSIS — I4729 Other ventricular tachycardia: Secondary | ICD-10-CM | POA: Insufficient documentation

## 2017-03-09 DIAGNOSIS — I480 Paroxysmal atrial fibrillation: Secondary | ICD-10-CM | POA: Diagnosis not present

## 2017-03-09 DIAGNOSIS — K219 Gastro-esophageal reflux disease without esophagitis: Secondary | ICD-10-CM | POA: Insufficient documentation

## 2017-03-09 DIAGNOSIS — I739 Peripheral vascular disease, unspecified: Secondary | ICD-10-CM | POA: Insufficient documentation

## 2017-03-09 DIAGNOSIS — J300 Vasomotor rhinitis: Secondary | ICD-10-CM | POA: Insufficient documentation

## 2017-03-09 DIAGNOSIS — M549 Dorsalgia, unspecified: Secondary | ICD-10-CM

## 2017-03-09 DIAGNOSIS — G8929 Other chronic pain: Secondary | ICD-10-CM | POA: Insufficient documentation

## 2017-03-09 DIAGNOSIS — Z7189 Other specified counseling: Secondary | ICD-10-CM

## 2017-03-09 MED ORDER — OXYCODONE HCL 10 MG PO TABS: 10.0000 mg | ORAL_TABLET | Freq: Four times a day (QID) | ORAL | 0 refills | Status: DC | PRN

## 2017-03-10 ENCOUNTER — Encounter (INDEPENDENT_AMBULATORY_CARE_PROVIDER_SITE_OTHER): Payer: Self-pay

## 2017-03-10 NOTE — Patient Instructions (Signed)
Rituximab injection What is this medicine? RITUXIMAB (ri TUX i mab) is a monoclonal antibody. It is used to treat certain types of cancer like non-Hodgkin lymphoma and chronic lymphocytic leukemia. It is also used to treat rheumatoid arthritis, granulomatosis with polyangiitis (or Wegener's granulomatosis), and microscopic polyangiitis. This medicine may be used for other purposes; ask your health care provider or pharmacist if you have questions. COMMON BRAND NAME(S): Rituxan What should I tell my health care provider before I take this medicine? They need to know if you have any of these conditions: -heart disease -infection (especially a virus infection such as hepatitis B, chickenpox, cold sores, or herpes) -immune system problems -irregular heartbeat -kidney disease -lung or breathing disease, like asthma -recently received or scheduled to receive a vaccine -an unusual or allergic reaction to rituximab, mouse proteins, other medicines, foods, dyes, or preservatives -pregnant or trying to get pregnant -breast-feeding How should I use this medicine? This medicine is for infusion into a vein. It is administered in a hospital or clinic by a specially trained health care professional. A special MedGuide will be given to you by the pharmacist with each prescription and refill. Be sure to read this information carefully each time. Talk to your pediatrician regarding the use of this medicine in children. This medicine is not approved for use in children. Overdosage: If you think you have taken too much of this medicine contact a poison control center or emergency room at once. NOTE: This medicine is only for you. Do not share this medicine with others. What if I miss a dose? It is important not to miss a dose. Call your doctor or health care professional if you are unable to keep an appointment. What may interact with this medicine? -cisplatin -other medicines for arthritis like disease  modifying antirheumatic drugs or tumor necrosis factor inhibitors -live virus vaccines This list may not describe all possible interactions. Give your health care provider a list of all the medicines, herbs, non-prescription drugs, or dietary supplements you use. Also tell them if you smoke, drink alcohol, or use illegal drugs. Some items may interact with your medicine. What should I watch for while using this medicine? Your condition will be monitored carefully while you are receiving this medicine. You may need blood work done while you are taking this medicine. This medicine can cause serious allergic reactions. To reduce your risk you may need to take medicine before treatment with this medicine. Take your medicine as directed. In some patients, this medicine may cause a serious brain infection that may cause death. If you have any problems seeing, thinking, speaking, walking, or standing, tell your doctor right away. If you cannot reach your doctor, urgently seek other source of medical care. Call your doctor or health care professional for advice if you get a fever, chills or sore throat, or other symptoms of a cold or flu. Do not treat yourself. This drug decreases your body's ability to fight infections. Try to avoid being around people who are sick. Do not become pregnant while taking this medicine or for 12 months after stopping it. Women should inform their doctor if they wish to become pregnant or think they might be pregnant. There is a potential for serious side effects to an unborn child. Talk to your health care professional or pharmacist for more information. What side effects may I notice from receiving this medicine? Side effects that you should report to your doctor or health care professional as soon as possible: -breathing   problems -chest pain -dizziness or feeling faint -fast, irregular heartbeat -low blood counts - this medicine may decrease the number of white blood cells,  red blood cells and platelets. You may be at increased risk for infections and bleeding. -mouth sores -redness, blistering, peeling or loosening of the skin, including inside the mouth (this can be added for any serious or exfoliative rash that could lead to hospitalization) -signs of infection - fever or chills, cough, sore throat, pain or difficulty passing urine -signs and symptoms of kidney injury like trouble passing urine or change in the amount of urine -signs and symptoms of liver injury like dark yellow or brown urine; general ill feeling or flu-like symptoms; light-colored stools; loss of appetite; nausea; right upper belly pain; unusually weak or tired; yellowing of the eyes or skin -stomach pain -vomiting Side effects that usually do not require medical attention (report to your doctor or health care professional if they continue or are bothersome): -headache -joint pain -muscle cramps or muscle pain This list may not describe all possible side effects. Call your doctor for medical advice about side effects. You may report side effects to FDA at 1-800-FDA-1088. Where should I keep my medicine? This drug is given in a hospital or clinic and will not be stored at home. NOTE: This sheet is a summary. It may not cover all possible information. If you have questions about this medicine, talk to your doctor, pharmacist, or health care provider.  2018 Else[employer]/Gold Standard (2016-06-17 15:28:09) Doxorubicin injection What is this medicine? DOXORUBICIN (dox oh ROO bi sin) is a chemotherapy drug. It is used to treat many kinds of cancer like leukemia, lymphoma, neuroblastoma, sarcoma, and Wilms' tumor. It is also used to treat bladder cancer, breast cancer, lung cancer, ovarian cancer, stomach cancer, and thyroid cancer. This medicine may be used for other purposes; ask your health care provider or pharmacist if you have questions. COMMON BRAND NAME(S): Adriamycin, Adriamycin PFS, Adriamycin  RDF, Rubex What should I tell my health care provider before I take this medicine? They need to know if you have any of these conditions: -heart disease -history of low blood counts caused by a medicine -liver disease -recent or ongoing radiation therapy -an unusual or allergic reaction to doxorubicin, other chemotherapy agents, other medicines, foods, dyes, or preservatives -pregnant or trying to get pregnant -breast-feeding How should I use this medicine? This drug is given as an infusion into a vein. It is administered in a hospital or clinic by a specially trained health care professional. If you have pain, swelling, burning or any unusual feeling around the site of your injection, tell your health care professional right away. Talk to your pediatrician regarding the use of this medicine in children. Special care may be needed. Overdosage: If you think you have taken too much of this medicine contact a poison control center or emergency room at once. NOTE: This medicine is only for you. Do not share this medicine with others. What if I miss a dose? It is important not to miss your dose. Call your doctor or health care professional if you are unable to keep an appointment. What may interact with this medicine? This medicine may interact with the following medications: -6-mercaptopurine -paclitaxel -phenytoin -St. John's Wort -trastuzumab -verapamil This list may not describe all possible interactions. Give your health care provider a list of all the medicines, herbs, non-prescription drugs, or dietary supplements you use. Also tell them if you smoke, drink alcohol, or use illegal drugs.   Some items may interact with your medicine. What should I watch for while using this medicine? This drug may make you feel generally unwell. This is not uncommon, as chemotherapy can affect healthy cells as well as cancer cells. Report any side effects. Continue your course of treatment even though you  feel ill unless your doctor tells you to stop. There is a maximum amount of this medicine you should receive throughout your life. The amount depends on the medical condition being treated and your overall health. Your doctor will watch how much of this medicine you receive in your lifetime. Tell your doctor if you have taken this medicine before. You may need blood work done while you are taking this medicine. Your urine may turn red for a few days after your dose. This is not blood. If your urine is dark or brown, call your doctor. In some cases, you may be given additional medicines to help with side effects. Follow all directions for their use. Call your doctor or health care professional for advice if you get a fever, chills or sore throat, or other symptoms of a cold or flu. Do not treat yourself. This drug decreases your body's ability to fight infections. Try to avoid being around people who are sick. This medicine may increase your risk to bruise or bleed. Call your doctor or health care professional if you notice any unusual bleeding. Talk to your doctor about your risk of cancer. You may be more at risk for certain types of cancers if you take this medicine. Do not become pregnant while taking this medicine or for 6 months after stopping it. Women should inform their doctor if they wish to become pregnant or think they might be pregnant. Men should not father a child while taking this medicine and for 6 months after stopping it. There is a potential for serious side effects to an unborn child. Talk to your health care professional or pharmacist for more information. Do not breast-feed an infant while taking this medicine. This medicine has caused ovarian failure in some women and reduced sperm counts in some men This medicine may interfere with the ability to have a child. Talk with your doctor or health care professional if you are concerned about your fertility. What side effects may I notice  from receiving this medicine? Side effects that you should report to your doctor or health care professional as soon as possible: -allergic reactions like skin rash, itching or hives, swelling of the face, lips, or tongue -breathing problems -chest pain -fast or irregular heartbeat -low blood counts - this medicine may decrease the number of white blood cells, red blood cells and platelets. You may be at increased risk for infections and bleeding. -pain, redness, or irritation at site where injected -signs of infection - fever or chills, cough, sore throat, pain or difficulty passing urine -signs of decreased platelets or bleeding - bruising, pinpoint red spots on the skin, black, tarry stools, blood in the urine -swelling of the ankles, feet, hands -tiredness -weakness Side effects that usually do not require medical attention (report to your doctor or health care professional if they continue or are bothersome): -diarrhea -hair loss -mouth sores -nail discoloration or damage -nausea -red colored urine -vomiting This list may not describe all possible side effects. Call your doctor for medical advice about side effects. You may report side effects to FDA at 1-800-FDA-1088. Where should I keep my medicine? This drug is given in a hospital or clinic   and will not be stored at home. NOTE: This sheet is a summary. It may not cover all possible information. If you have questions about this medicine, talk to your doctor, pharmacist, or health care provider.  2018 Elsevier/Gold Standard (2016-01-06 11:28:51) Vincristine injection What is this medicine? VINCRISTINE (vin KRIS teen) is a chemotherapy drug. It slows the growth of cancer cells. This medicine is used to treat many types of cancer like Hodgkin's disease, leukemia, non-Hodgkin's lymphoma, neuroblastoma (brain cancer), rhabdomyosarcoma, and Wilms' tumor. This medicine may be used for other purposes; ask your health care provider or  pharmacist if you have questions. COMMON BRAND NAME(S): Oncovin, Vincasar PFS What should I tell my health care provider before I take this medicine? They need to know if you have any of these conditions: -blood disorders -gout -infection (especially chickenpox, cold sores, or herpes) -kidney disease -liver disease -lung disease -nervous system disease like Charcot-Marie-Tooth (CMT) -recent or ongoing radiation therapy -an unusual or allergic reaction to vincristine, other chemotherapy agents, other medicines, foods, dyes, or preservatives -pregnant or trying to get pregnant -breast-feeding How should I use this medicine? This drug is given as an infusion into a vein. It is administered in a hospital or clinic by a specially trained health care professional. If you have pain, swelling, burning, or any unusual feeling around the site of your injection, tell your health care professional right away. Talk to your pediatrician regarding the use of this medicine in children. While this drug may be prescribed for selected conditions, precautions do apply. Overdosage: If you think you have taken too much of this medicine contact a poison control center or emergency room at once. NOTE: This medicine is only for you. Do not share this medicine with others. What if I miss a dose? It is important not to miss your dose. Call your doctor or health care professional if you are unable to keep an appointment. What may interact with this medicine? Do not take this medicine with any of the following medications: -itraconazole -mibefradil -voriconazole This medicine may also interact with the following medications: -cyclosporine -erythromycin -fluconazole -ketoconazole -medicines for HIV like delavirdine, efavirenz, nevirapine -medicines for seizures like ethotoin, fosphenotoin, phenytoin -medicines to increase blood counts like filgrastim, pegfilgrastim, sargramostim -other chemotherapy drugs like  cisplatin, L-asparaginase, methotrexate, mitomycin, paclitaxel -pegaspargase -vaccines -zalcitabine, ddC Talk to your doctor or health care professional before taking any of these medicines: -acetaminophen -aspirin -ibuprofen -ketoprofen -naproxen This list may not describe all possible interactions. Give your health care provider a list of all the medicines, herbs, non-prescription drugs, or dietary supplements you use. Also tell them if you smoke, drink alcohol, or use illegal drugs. Some items may interact with your medicine. What should I watch for while using this medicine? Your condition will be monitored carefully while you are receiving this medicine. You will need important blood work done while you are taking this medicine. This drug may make you feel generally unwell. This is not uncommon, as chemotherapy can affect healthy cells as well as cancer cells. Report any side effects. Continue your course of treatment even though you feel ill unless your doctor tells you to stop. In some cases, you may be given additional medicines to help with side effects. Follow all directions for their use. Call your doctor or health care professional for advice if you get a fever, chills or sore throat, or other symptoms of a cold or flu. Do not treat yourself. Avoid taking products that contain aspirin, acetaminophen, ibuprofen,   naproxen, or ketoprofen unless instructed by your doctor. These medicines may hide a fever. Do not become pregnant while taking this medicine. Women should inform their doctor if they wish to become pregnant or think they might be pregnant. There is a potential for serious side effects to an unborn child. Talk to your health care professional or pharmacist for more information. Do not breast-feed an infant while taking this medicine. Men may have a lower sperm count while taking this medicine. Talk to your doctor if you plan to father a child. What side effects may I notice from  receiving this medicine? Side effects that you should report to your doctor or health care professional as soon as possible: -allergic reactions like skin rash, itching or hives, swelling of the face, lips, or tongue -breathing problems -confusion or changes in emotions or moods -constipation -cough -mouth sores -muscle weakness -nausea and vomiting -pain, swelling, redness or irritation at the injection site -pain, tingling, numbness in the hands or feet -problems with balance, talking, walking -seizures -stomach pain -trouble passing urine or change in the amount of urine Side effects that usually do not require medical attention (report to your doctor or health care professional if they continue or are bothersome): -diarrhea -hair loss -jaw pain -loss of appetite This list may not describe all possible side effects. Call your doctor for medical advice about side effects. You may report side effects to FDA at 1-800-FDA-1088. Where should I keep my medicine? This drug is given in a hospital or clinic and will not be stored at home. NOTE: This sheet is a summary. It may not cover all possible information. If you have questions about this medicine, talk to your doctor, pharmacist, or health care provider.  2018 Elsevier/Gold Standard (2008-08-06 17:17:13) Cyclophosphamide injection What is this medicine? CYCLOPHOSPHAMIDE (sye kloe FOSS fa mide) is a chemotherapy drug. It slows the growth of cancer cells. This medicine is used to treat many types of cancer like lymphoma, myeloma, leukemia, breast cancer, and ovarian cancer, to name a few. This medicine may be used for other purposes; ask your health care provider or pharmacist if you have questions. COMMON BRAND NAME(S): Cytoxan, Neosar What should I tell my health care provider before I take this medicine? They need to know if you have any of these conditions: -blood disorders -history of other chemotherapy -infection -kidney  disease -liver disease -recent or ongoing radiation therapy -tumors in the bone marrow -an unusual or allergic reaction to cyclophosphamide, other chemotherapy, other medicines, foods, dyes, or preservatives -pregnant or trying to get pregnant -breast-feeding How should I use this medicine? This drug is usually given as an injection into a vein or muscle or by infusion into a vein. It is administered in a hospital or clinic by a specially trained health care professional. Talk to your pediatrician regarding the use of this medicine in children. Special care may be needed. Overdosage: If you think you have taken too much of this medicine contact a poison control center or emergency room at once. NOTE: This medicine is only for you. Do not share this medicine with others. What if I miss a dose? It is important not to miss your dose. Call your doctor or health care professional if you are unable to keep an appointment. What may interact with this medicine? This medicine may interact with the following medications: -amiodarone -amphotericin B -azathioprine -certain antiviral medicines for HIV or AIDS such as protease inhibitors (e.g., indinavir, ritonavir) and zidovudine -certain blood   pressure medications such as benazepril, captopril, enalapril, fosinopril, lisinopril, moexipril, monopril, perindopril, quinapril, ramipril, trandolapril -certain cancer medications such as anthracyclines (e.g., daunorubicin, doxorubicin), busulfan, cytarabine, paclitaxel, pentostatin, tamoxifen, trastuzumab -certain diuretics such as chlorothiazide, chlorthalidone, hydrochlorothiazide, indapamide, metolazone -certain medicines that treat or prevent blood clots like warfarin -certain muscle relaxants such as succinylcholine -cyclosporine -etanercept -indomethacin -medicines to increase blood counts like filgrastim, pegfilgrastim, sargramostim -medicines used as general  anesthesia -metronidazole -natalizumab This list may not describe all possible interactions. Give your health care provider a list of all the medicines, herbs, non-prescription drugs, or dietary supplements you use. Also tell them if you smoke, drink alcohol, or use illegal drugs. Some items may interact with your medicine. What should I watch for while using this medicine? Visit your doctor for checks on your progress. This drug may make you feel generally unwell. This is not uncommon, as chemotherapy can affect healthy cells as well as cancer cells. Report any side effects. Continue your course of treatment even though you feel ill unless your doctor tells you to stop. Drink water or other fluids as directed. Urinate often, even at night. In some cases, you may be given additional medicines to help with side effects. Follow all directions for their use. Call your doctor or health care professional for advice if you get a fever, chills or sore throat, or other symptoms of a cold or flu. Do not treat yourself. This drug decreases your body's ability to fight infections. Try to avoid being around people who are sick. This medicine may increase your risk to bruise or bleed. Call your doctor or health care professional if you notice any unusual bleeding. Be careful brushing and flossing your teeth or using a toothpick because you may get an infection or bleed more easily. If you have any dental work done, tell your dentist you are receiving this medicine. You may get drowsy or dizzy. Do not drive, use machinery, or do anything that needs mental alertness until you know how this medicine affects you. Do not become pregnant while taking this medicine or for 1 year after stopping it. Women should inform their doctor if they wish to become pregnant or think they might be pregnant. Men should not father a child while taking this medicine and for 4 months after stopping it. There is a potential for serious side  effects to an unborn child. Talk to your health care professional or pharmacist for more information. Do not breast-feed an infant while taking this medicine. This medicine may interfere with the ability to have a child. This medicine has caused ovarian failure in some women. This medicine has caused reduced sperm counts in some men. You should talk with your doctor or health care professional if you are concerned about your fertility. If you are going to have surgery, tell your doctor or health care professional that you have taken this medicine. What side effects may I notice from receiving this medicine? Side effects that you should report to your doctor or health care professional as soon as possible: -allergic reactions like skin rash, itching or hives, swelling of the face, lips, or tongue -low blood counts - this medicine may decrease the number of white blood cells, red blood cells and platelets. You may be at increased risk for infections and bleeding. -signs of infection - fever or chills, cough, sore throat, pain or difficulty passing urine -signs of decreased platelets or bleeding - bruising, pinpoint red spots on the skin, black, tarry stools, blood   in the urine -signs of decreased red blood cells - unusually weak or tired, fainting spells, lightheadedness -breathing problems -dark urine -dizziness -palpitations -swelling of the ankles, feet, hands -trouble passing urine or change in the amount of urine -weight gain -yellowing of the eyes or skin Side effects that usually do not require medical attention (report to your doctor or health care professional if they continue or are bothersome): -changes in nail or skin color -hair loss -missed menstrual periods -mouth sores -nausea, vomiting This list may not describe all possible side effects. Call your doctor for medical advice about side effects. You may report side effects to FDA at 1-800-FDA-1088. Where should I keep my  medicine? This drug is given in a hospital or clinic and will not be stored at home. NOTE: This sheet is a summary. It may not cover all possible information. If you have questions about this medicine, talk to your doctor, pharmacist, or health care provider.  2018 [employer] Standard (2012-09-23 16:22:58)  

## 2017-03-11 ENCOUNTER — Other Ambulatory Visit (INDEPENDENT_AMBULATORY_CARE_PROVIDER_SITE_OTHER): Payer: Self-pay | Admitting: Vascular Surgery

## 2017-03-11 ENCOUNTER — Inpatient Hospital Stay: Payer: 59

## 2017-03-12 ENCOUNTER — Telehealth: Payer: Self-pay | Admitting: *Deleted

## 2017-03-12 NOTE — Telephone Encounter (Signed)
Go ahead and refill early.  Thanks.

## 2017-03-12 NOTE — Telephone Encounter (Signed)
Pharmacy notified.

## 2017-03-12 NOTE — Telephone Encounter (Signed)
The pharmacy will not refill his oxycodone because it is too soon, He will be completely out of medicine 2 days prior to the fill date. The pharmacy told her if we call them, they may be able to do an early refill. Please advise.

## 2017-03-14 MED ORDER — CEFAZOLIN IN D5W 1 GM/50ML IV SOLN
1.0000 g | Freq: Once | INTRAVENOUS | Status: AC
  Administered 2017-03-15: 1 g via INTRAVENOUS

## 2017-03-15 ENCOUNTER — Other Ambulatory Visit: Payer: Self-pay | Admitting: General Surgery

## 2017-03-15 ENCOUNTER — Ambulatory Visit
Admission: RE | Admit: 2017-03-15 | Discharge: 2017-03-15 | Disposition: A | Discharge status: Home / Self Care | Payer: 59 | Source: Ambulatory Visit | Attending: Vascular Surgery | Admitting: Vascular Surgery

## 2017-03-15 ENCOUNTER — Encounter
Admission: RE | Disposition: A | Discharge status: Home / Self Care | Payer: Self-pay | Source: Ambulatory Visit | Attending: Vascular Surgery

## 2017-03-15 ENCOUNTER — Telehealth (INDEPENDENT_AMBULATORY_CARE_PROVIDER_SITE_OTHER): Payer: Self-pay

## 2017-03-15 ENCOUNTER — Encounter: Payer: Self-pay | Admitting: *Deleted

## 2017-03-15 DIAGNOSIS — Z7189 Other specified counseling: Secondary | ICD-10-CM | POA: Insufficient documentation

## 2017-03-15 DIAGNOSIS — C8338 Diffuse large B-cell lymphoma, lymph nodes of multiple sites: Secondary | ICD-10-CM | POA: Insufficient documentation

## 2017-03-15 DIAGNOSIS — C8590 Non-Hodgkin lymphoma, unspecified, unspecified site: Secondary | ICD-10-CM | POA: Insufficient documentation

## 2017-03-15 HISTORY — PX: PORTA CATH INSERTION: CATH118285

## 2017-03-15 SURGERY — PORTA CATH INSERTION
Anesthesia: Moderate Sedation

## 2017-03-15 MED ORDER — HYDROMORPHONE HCL 1 MG/ML IJ SOLN: 1.0000 mg | Freq: Once | INTRAMUSCULAR | Status: DC | PRN

## 2017-03-15 MED ORDER — MIDAZOLAM HCL 2 MG/2ML IJ SOLN
INTRAMUSCULAR | Status: DC | PRN
  Administered 2017-03-15: 1 mg via INTRAVENOUS
  Administered 2017-03-15: 2 mg via INTRAVENOUS

## 2017-03-15 MED ORDER — SODIUM CHLORIDE 0.9 % IR SOLN
Freq: Once | Status: DC
  Filled 2017-03-15: qty 2

## 2017-03-15 MED ORDER — FENTANYL CITRATE (PF) 100 MCG/2ML IJ SOLN
INTRAMUSCULAR | Status: DC | PRN
  Administered 2017-03-15: 25 ug via INTRAVENOUS
  Administered 2017-03-15: 50 ug via INTRAVENOUS
  Administered 2017-03-15: 25 ug via INTRAVENOUS

## 2017-03-15 MED ORDER — LIDOCAINE-EPINEPHRINE (PF) 2 %-1:200000 IJ SOLN
INTRAMUSCULAR | Status: AC
  Filled 2017-03-15: qty 20

## 2017-03-15 MED ORDER — ONDANSETRON HCL 4 MG/2ML IJ SOLN: 4.0000 mg | Freq: Four times a day (QID) | INTRAMUSCULAR | Status: DC | PRN

## 2017-03-15 MED ORDER — MIDAZOLAM HCL 5 MG/5ML IJ SOLN
INTRAMUSCULAR | Status: AC
  Filled 2017-03-15: qty 5

## 2017-03-15 MED ORDER — FENTANYL CITRATE (PF) 100 MCG/2ML IJ SOLN
INTRAMUSCULAR | Status: AC
  Filled 2017-03-15: qty 2

## 2017-03-15 MED ORDER — SODIUM CHLORIDE 0.9 % IV SOLN
INTRAVENOUS | Status: DC
  Administered 2017-03-15: 13:00:00 via INTRAVENOUS

## 2017-03-15 MED ORDER — HEPARIN (PORCINE) IN NACL 2-0.9 UNIT/ML-% IJ SOLN
INTRAMUSCULAR | Status: AC
  Filled 2017-03-15: qty 500

## 2017-03-15 SURGICAL SUPPLY — 11 items
DERMABOND ADVANCED (GAUZE/BANDAGES/DRESSINGS) ×2
DERMABOND ADVANCED .7 DNX12 (GAUZE/BANDAGES/DRESSINGS) ×1 IMPLANT
KIT PORT POWER 8FR ISP CVUE (Catheter) ×3 IMPLANT
PACK ANGIOGRAPHY (CUSTOM PROCEDURE TRAY) ×3 IMPLANT
PAD GROUND ADULT SPLIT (MISCELLANEOUS) ×3 IMPLANT
PENCIL ELECTRO HAND CTR (MISCELLANEOUS) ×3 IMPLANT
SPONGE XRAY 4X4 16PLY STRL (MISCELLANEOUS) ×3 IMPLANT
SUT MNCRL AB 4-0 PS2 18 (SUTURE) ×3 IMPLANT
SUT PROLENE 0 CT 1 30 (SUTURE) ×3 IMPLANT
SUTURE VIC 3-0 (SUTURE) ×3 IMPLANT
TOWEL OR 17X26 4PK STRL BLUE (TOWEL DISPOSABLE) ×3 IMPLANT

## 2017-03-15 NOTE — Progress Notes (Signed)
START ON PATHWAY REGIMEN - Lymphoma and CLL     A cycle is every 21 days:     Rituximab      Cyclophosphamide      Doxorubicin      Vincristine      Prednisone   **Always confirm dose/schedule in your pharmacy ordering system**    Patient Characteristics: Diffuse Large B Cell, First Line, Stage III and IV Disease Type: Not Applicable Disease Type: Diffuse Large B Cell Line of therapy: First Line [place] Stage: III  Intent of Therapy: Curative Intent, Discussed with Patient

## 2017-03-15 NOTE — Op Note (Signed)
      [facility]       Operative Note  Date: 03/15/2017  Preoperative diagnosis:  1. lymphoma  Postoperative diagnosis:  Same as above  Procedures: #1. Ultrasound guidance for vascular access to the right internal jugular vein. #2. Fluoroscopic guidance for placement of catheter. #3. Placement of CT compatible Port-A-Cath, right internal jugular vein.  Surgeon: Leotis Pain, MD.   Anesthesia: Local with moderate conscious sedation for approximately 20 minutes using 3 mg of Versed and 100 mcg of Fentanyl  Fluoroscopy time: less than 1 minute  Contrast used: 0  Estimated blood loss: 15 cc  Indication for the procedure:  The patient is a 66 y.o.male with lymphoma.  The patient needs a Port-A-Cath for durable venous access, chemotherapy, lab draws, and CT scans. We are asked to place this. Risks and benefits were discussed and informed consent was obtained.  Description of procedure: The patient was brought to the vascular and interventional radiology suite.  Moderate conscious sedation was administered throughout the procedure during a face to face encounter with the patient with my supervision of the RN administering medicines and monitoring the patient's vital signs, pulse oximetry, telemetry and mental status throughout from the start of the procedure until the patient was taken to the recovery room. The right neck chest and shoulder were sterilely prepped and draped, and a sterile surgical field was created. Ultrasound was used to help visualize a patent right internal jugular vein. This was then accessed under direct ultrasound guidance without difficulty with the Seldinger needle and a permanent image was recorded. A J-wire was placed. After skin nick and dilatation, the peel-away sheath was then placed over the wire. I then anesthetized an area under the clavicle approximately 1-2 fingerbreadths. A transverse incision was created and an inferior pocket was created  with electrocautery and blunt dissection. The port was then brought onto the field, placed into the pocket and secured to the chest wall with 2 Prolene sutures. The catheter was connected to the port and tunneled from the subclavicular incision to the access site. Fluoroscopic guidance was then used to cut the catheter to an appropriate length. The catheter was then placed through the peel-away sheath and the peel-away sheath was removed. The catheter tip was parked in excellent location under fluorocoscopic guidance in the cavoatrial junction. The pocket was then irrigated with antibiotic impregnated saline and the wound was closed with a running 3-0 Vicryl and a 4-0 Monocryl. The access incision was closed with a single 4-0 Monocryl. The Huber needle was used to withdraw blood and flush the port with heparinized saline. Dermabond was then placed as a dressing. The patient tolerated the procedure well and was taken to the recovery room in stable condition.   Leotis Pain 03/15/2017 1:49 PM   This note was created with Dragon Medical transcription system. Any errors in dictation are purely unintentional.

## 2017-03-15 NOTE — H&P (Signed)
[facility] History & Physical Update  The patient was interviewed and re-examined.  The patient's previous History and Physical has been reviewed and is unchanged.  There is no change in the plan of care. We plan to proceed with the scheduled procedure.  Leotis Pain, MD  03/15/2017, 11:42 AM

## 2017-03-15 NOTE — Discharge Instructions (Signed)
Implanted Port Home Guide An implanted port is a type of central line that is placed under the skin. Central lines are used to provide IV access when treatment or nutrition needs to be given through a person's veins. Implanted ports are used for long-term IV access. An implanted port may be placed because:  You need IV medicine that would be irritating to the small veins in your hands or arms.  You need long-term IV medicines, such as antibiotics.  You need IV nutrition for a long period.  You need frequent blood draws for lab tests.  You need dialysis.  Implanted ports are usually placed in the chest area, but they can also be placed in the upper arm, the abdomen, or the leg. An implanted port has two main parts:  Reservoir. The reservoir is round and will appear as a small, raised area under your skin. The reservoir is the part where a needle is inserted to give medicines or draw blood.  Catheter. The catheter is a thin, flexible tube that extends from the reservoir. The catheter is placed into a large vein. Medicine that is inserted into the reservoir goes into the catheter and then into the vein.  How will I care for my incision site? Do not get the incision site wet. Bathe or shower as directed by your health care provider. How is my port accessed? Special steps must be taken to access the port:  Before the port is accessed, a numbing cream can be placed on the skin. This helps numb the skin over the port site.  Your health care provider uses a sterile technique to access the port. ? Your health care provider must put on a mask and sterile gloves. ? The skin over your port is cleaned carefully with an antiseptic and allowed to dry. ? The port is gently pinched between sterile gloves, and a needle is inserted into the port.  Only "non-coring" port needles should be used to access the port. Once the port is accessed, a blood return should be checked. This helps ensure that the port  is in the vein and is not clogged.  If your port needs to remain accessed for a constant infusion, a clear (transparent) bandage will be placed over the needle site. The bandage and needle will need to be changed every week, or as directed by your health care provider.  Keep the bandage covering the needle clean and dry. Do not get it wet. Follow your health care provider's instructions on how to take a shower or bath while the port is accessed.  If your port does not need to stay accessed, no bandage is needed over the port.  What is flushing? Flushing helps keep the port from getting clogged. Follow your health care provider's instructions on how and when to flush the port. Ports are usually flushed with saline solution or a medicine called heparin. The need for flushing will depend on how the port is used.  If the port is used for intermittent medicines or blood draws, the port will need to be flushed: ? After medicines have been given. ? After blood has been drawn. ? As part of routine maintenance.  If a constant infusion is running, the port may not need to be flushed.  How long will my port stay implanted? The port can stay in for as long as your health care provider thinks it is needed. When it is time for the port to come out, surgery will be   done to remove it. The procedure is similar to the one performed when the port was put in. When should I seek immediate medical care? When you have an implanted port, you should seek immediate medical care if:  You notice a bad smell coming from the incision site.  You have swelling, redness, or drainage at the incision site.  You have more swelling or pain at the port site or the surrounding area.  You have a fever that is not controlled with medicine.  This information is not intended to replace advice given to you by your health care provider. Make sure you discuss any questions you have with your health care provider. Document  Released: 11/09/2005 Document Revised: 04/16/2016 Document Reviewed: 07/17/2013 Else[employer] Interactive Patient Education  2017 [employer]  

## 2017-03-15 NOTE — Telephone Encounter (Signed)
Called into the patient's pharmacy Emla cream topical 2.5% , apply to area 2 hours before 30 gram tube , 1 refill.

## 2017-03-16 ENCOUNTER — Ambulatory Visit
Admission: RE | Admit: 2017-03-16 | Discharge: 2017-03-16 | Disposition: A | Discharge status: Home / Self Care | Payer: 59 | Source: Ambulatory Visit | Attending: Oncology | Admitting: Oncology

## 2017-03-16 ENCOUNTER — Encounter: Payer: Self-pay | Admitting: Vascular Surgery

## 2017-03-16 ENCOUNTER — Other Ambulatory Visit: Payer: Self-pay | Admitting: Student

## 2017-03-16 DIAGNOSIS — R1900 Intra-abdominal and pelvic swelling, mass and lump, unspecified site: Secondary | ICD-10-CM | POA: Diagnosis present

## 2017-03-16 DIAGNOSIS — C8338 Diffuse large B-cell lymphoma, lymph nodes of multiple sites: Secondary | ICD-10-CM | POA: Insufficient documentation

## 2017-03-16 LAB — SURGICAL PATHOLOGY

## 2017-03-16 MED ORDER — TECHNETIUM TC 99M-LABELED RED BLOOD CELLS IV KIT
20.0000 | PACK | Freq: Once | INTRAVENOUS | Status: AC | PRN
  Administered 2017-03-16: 21.63 via INTRAVENOUS

## 2017-03-17 ENCOUNTER — Other Ambulatory Visit: Payer: Self-pay | Admitting: Oncology

## 2017-03-17 ENCOUNTER — Other Ambulatory Visit (HOSPITAL_COMMUNITY)
Admission: RE | Admit: 2017-03-17 | Disposition: A | Discharge status: Home / Self Care | Payer: 59 | Source: Ambulatory Visit | Attending: Oncology | Admitting: Oncology

## 2017-03-17 ENCOUNTER — Ambulatory Visit
Admission: RE | Admit: 2017-03-17 | Discharge: 2017-03-17 | Disposition: A | Discharge status: Home / Self Care | Payer: 59 | Source: Ambulatory Visit | Attending: Oncology | Admitting: Oncology

## 2017-03-17 DIAGNOSIS — Z803 Family history of malignant neoplasm of breast: Secondary | ICD-10-CM | POA: Diagnosis not present

## 2017-03-17 DIAGNOSIS — G4733 Obstructive sleep apnea (adult) (pediatric): Secondary | ICD-10-CM | POA: Insufficient documentation

## 2017-03-17 DIAGNOSIS — Z9889 Other specified postprocedural states: Secondary | ICD-10-CM | POA: Diagnosis not present

## 2017-03-17 DIAGNOSIS — C8338 Diffuse large B-cell lymphoma, lymph nodes of multiple sites: Secondary | ICD-10-CM | POA: Insufficient documentation

## 2017-03-17 DIAGNOSIS — I2510 Atherosclerotic heart disease of native coronary artery without angina pectoris: Secondary | ICD-10-CM | POA: Diagnosis not present

## 2017-03-17 DIAGNOSIS — Z7901 Long term (current) use of anticoagulants: Secondary | ICD-10-CM | POA: Insufficient documentation

## 2017-03-17 DIAGNOSIS — Z808 Family history of malignant neoplasm of other organs or systems: Secondary | ICD-10-CM | POA: Insufficient documentation

## 2017-03-17 DIAGNOSIS — Z836 Family history of other diseases of the respiratory system: Secondary | ICD-10-CM | POA: Insufficient documentation

## 2017-03-17 DIAGNOSIS — Z9049 Acquired absence of other specified parts of digestive tract: Secondary | ICD-10-CM | POA: Insufficient documentation

## 2017-03-17 DIAGNOSIS — I8393 Asymptomatic varicose veins of bilateral lower extremities: Secondary | ICD-10-CM | POA: Insufficient documentation

## 2017-03-17 DIAGNOSIS — Z87891 Personal history of nicotine dependence: Secondary | ICD-10-CM | POA: Diagnosis not present

## 2017-03-17 DIAGNOSIS — Z859 Personal history of malignant neoplasm, unspecified: Secondary | ICD-10-CM | POA: Insufficient documentation

## 2017-03-17 DIAGNOSIS — Z9884 Bariatric surgery status: Secondary | ICD-10-CM | POA: Insufficient documentation

## 2017-03-17 DIAGNOSIS — R1900 Intra-abdominal and pelvic swelling, mass and lump, unspecified site: Secondary | ICD-10-CM | POA: Insufficient documentation

## 2017-03-17 DIAGNOSIS — I480 Paroxysmal atrial fibrillation: Secondary | ICD-10-CM | POA: Insufficient documentation

## 2017-03-17 DIAGNOSIS — Z8249 Family history of ischemic heart disease and other diseases of the circulatory system: Secondary | ICD-10-CM | POA: Insufficient documentation

## 2017-03-17 DIAGNOSIS — K219 Gastro-esophageal reflux disease without esophagitis: Secondary | ICD-10-CM | POA: Insufficient documentation

## 2017-03-17 DIAGNOSIS — M19049 Primary osteoarthritis, unspecified hand: Secondary | ICD-10-CM | POA: Diagnosis not present

## 2017-03-17 HISTORY — DX: Malignant (primary) neoplasm, unspecified: C80.1

## 2017-03-17 LAB — PROTIME-INR
INR: 1.43
PROTHROMBIN TIME: 17.6 s — AB (ref 11.4–15.2)

## 2017-03-17 LAB — CBC WITH DIFFERENTIAL/PLATELET
BASOS PCT: 1 %
Basophils Absolute: 0.1 10*3/uL (ref 0–0.1)
EOS ABS: 0.1 10*3/uL (ref 0–0.7)
Eosinophils Relative: 2 %
HCT: 43 % (ref 40.0–52.0)
Hemoglobin: 14.5 g/dL (ref 13.0–18.0)
Lymphocytes Relative: 17 %
Lymphs Abs: 1.3 10*3/uL (ref 1.0–3.6)
MCH: 29 pg (ref 26.0–34.0)
MCHC: 33.8 g/dL (ref 32.0–36.0)
MCV: 85.9 fL (ref 80.0–100.0)
MONO ABS: 0.7 10*3/uL (ref 0.2–1.0)
MONOS PCT: 9 %
Neutro Abs: 5.6 10*3/uL (ref 1.4–6.5)
Neutrophils Relative %: 71 %
Platelets: 284 10*3/uL (ref 150–440)
RBC: 5.01 MIL/uL (ref 4.40–5.90)
RDW: 13.8 % (ref 11.5–14.5)
WBC: 7.7 10*3/uL (ref 3.8–10.6)

## 2017-03-17 LAB — APTT: aPTT: 40 seconds — ABNORMAL HIGH (ref 24–36)

## 2017-03-17 MED ORDER — SODIUM CHLORIDE 0.9 % IV SOLN: INTRAVENOUS | Status: DC

## 2017-03-17 MED ORDER — MIDAZOLAM HCL 5 MG/5ML IJ SOLN
INTRAMUSCULAR | Status: AC
  Filled 2017-03-17: qty 5

## 2017-03-17 MED ORDER — HEPARIN SOD (PORK) LOCK FLUSH 100 UNIT/ML IV SOLN
500.0000 [IU] | Freq: Once | INTRAVENOUS | Status: DC
  Filled 2017-03-17: qty 5

## 2017-03-17 MED ORDER — FENTANYL CITRATE (PF) 100 MCG/2ML IJ SOLN
INTRAMUSCULAR | Status: AC
  Filled 2017-03-17: qty 4

## 2017-03-17 MED ORDER — MIDAZOLAM HCL 5 MG/5ML IJ SOLN
INTRAMUSCULAR | Status: AC | PRN
  Administered 2017-03-17 (×3): 1 mg via INTRAVENOUS

## 2017-03-17 MED ORDER — ALLOPURINOL 300 MG PO TABS: 300.0000 mg | ORAL_TABLET | Freq: Every day | ORAL | 3 refills | Status: DC

## 2017-03-17 MED ORDER — LIDOCAINE-PRILOCAINE 2.5-2.5 % EX CREA: TOPICAL_CREAM | CUTANEOUS | 3 refills | Status: DC

## 2017-03-17 MED ORDER — PROCHLORPERAZINE MALEATE 10 MG PO TABS: 10.0000 mg | ORAL_TABLET | Freq: Four times a day (QID) | ORAL | 6 refills | Status: DC | PRN

## 2017-03-17 MED ORDER — FENTANYL CITRATE (PF) 100 MCG/2ML IJ SOLN
INTRAMUSCULAR | Status: AC | PRN
  Administered 2017-03-17: 50 ug via INTRAVENOUS
  Administered 2017-03-17 (×2): 25 ug via INTRAVENOUS

## 2017-03-17 MED ORDER — PREDNISONE 20 MG PO TABS: 40.0000 mg/m2 | ORAL_TABLET | Freq: Every day | ORAL | 0 refills | Status: AC

## 2017-03-17 MED ORDER — ONDANSETRON HCL 8 MG PO TABS: 8.0000 mg | ORAL_TABLET | Freq: Two times a day (BID) | ORAL | 2 refills | Status: DC | PRN

## 2017-03-17 NOTE — Procedures (Signed)
b cell lymphoma  CT guided RT ILIAC BM ASP AND CORE BX  No comp Stable EBL 0 PATH pending Full report in PACS

## 2017-03-17 NOTE — Progress Notes (Signed)
[place]  [phone] [phone]  ID: Nicholas Reynolds OB: January 10, 1951  MR#: [id number]  VOZ#:[id number]  Patient Care Team: Valera Castle, MD as PCP - General  CHIEF COMPLAINT: DLBCL.  INTERVAL HISTORY: Patient returns to clinic today for further evaluation and initiation of cycle 1 of R CHOP chemotherapy. He continues to have pain, but it is better controlled on his current narcotic regimen. He continues to be anxious. He denies any fevers or illnesses. He has no neurologic complaints. He denies any shortness of breath, cough, or hemoptysis. He has no nausea, vomiting, or diarrhea. His constipation is better controlled. He has no urinary complaints. Patient offers no further specific complaints.  REVIEW OF SYSTEMS:   Review of Systems  Constitutional: Positive for weight loss. Negative for fever and malaise/fatigue.  Respiratory: Negative for cough, hemoptysis and shortness of breath.   Cardiovascular: Positive for chest pain. Negative for leg swelling.  Gastrointestinal: Positive for abdominal pain and constipation. Negative for blood in stool, diarrhea, melena, nausea and vomiting.  Genitourinary: Negative.   Musculoskeletal: Positive for back pain.  Neurological: Negative.  Negative for weakness.  Psychiatric/Behavioral: The patient is nervous/anxious and has insomnia.     As per HPI. Otherwise, a complete review of systems is negative.  PAST MEDICAL HISTORY: Past Medical History:  Diagnosis Date  . Arthritis    thumbs  . Asymptomatic varicose veins of bilateral lower extremities    Uncomfortable but in no ulcers  . Cancer ([place])   . Complication of anesthesia    slow to wake after hernia surg.  was taking "a bunch" of herbals at that time  . Coronary artery disease, non-occlusive July 2008   Nonobstructive by cath   . Family history of premature coronary artery disease    Father  . GERD (gastroesophageal reflux disease)    . 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 Obstructive sleep apnea    severe per study 09/05/14   . Paroxysmal atrial fibrillation ([place]) 2002   a) Initially diagnosed as [place] Fib while in [place] (on ASA & BB after) - was on warfarin for a couple years and a Flecainide Pill-in Pocket PRN; b) recurrent A. fib with RVR August 2050 - status post cardioversion with flecainide, on low-dose beta blocker  . Pre-syncope May 2015   Unrevealing monitor in May  . Uses hearing aid    bilateral  . Wears dentures    partial upper    PAST SURGICAL HISTORY: Past Surgical History:  Procedure Laterality Date  . CARDIAC CATHETERIZATION  2008   Nonobstructive CAD  . CHOLECYSTECTOMY    . ELBOW SURGERY    . Event monitor  May 2015   Unrevealing  . HERNIA REPAIR    . KNEE ARTHROSCOPY Left 06/28/2015   Procedure: ARTHROSCOPY KNEE WITH SYNOVECTOMY;  Surgeon: Leanor Kail, MD;  Location: [place];  Service: Orthopedics;  Laterality: Left;  CPAP  . [place]le RESECTION     December 20 14th  . NM MYOVIEW LTD  June 28, 2014   [facility]: [facility] -- no evidence of ischemia or infarction. Normal EF ~58%  . PORTA CATH INSERTION N/A 03/15/2017   Procedure: Glori Luis Cath  Insertion;  Surgeon: Algernon Huxley, MD;  Location: [place] [facility];  Service: Cardiovascular;  Laterality: N/A;  . RADIOFREQUENCY ABLATION of Ventricular Tachycardia  July 2000 the   RVOT VT ablation: [facility], [place]    . TRANSTHORACIC ECHOCARDIOGRAM  May 2015   [facility] - Dr. Sharyn Lull: EF 55%, mild LVH; otherwise mostly normal    FAMILY  HISTORY: Family History  Problem Relation Age of Onset  . Breast cancer Mother   . Bone cancer Mother   . COPD Father   . Arrhythmia Father   . Heart failure Father   . Heart attack Father     ADVANCED DIRECTIVES (Y/N):  N  HEALTH MAINTENANCE: Social History  Substance Use Topics  . Smoking status: Former Smoker    Quit date: 11/24/1999  . Smokeless tobacco: Never Used  . Alcohol use No     Colonoscopy:  PAP:  Bone density:  Lipid panel:  No Known Allergies  Current Outpatient Prescriptions  Medication Sig Dispense Refill  . allopurinol (ZYLOPRIM) 300 MG tablet Take 1 tablet (300 mg total) by mouth daily. 30 tablet 3  . lidocaine-prilocaine (EMLA) cream Apply to affected area once 30 g 3  . metoprolol succinate (TOPROL-XL) 50 MG 24 hr tablet Take 50 mg by mouth 2 (two) times daily. Take with or immediately following a meal.    . Multiple Vitamin (MULTIVITAMIN) tablet Take 1 tablet by mouth daily.    Marland Kitchen omeprazole (PRILOSEC) 40 MG capsule Take 40 mg by mouth 2 (two) times daily.    . ondansetron (ZOFRAN) 8 MG tablet Take 1 tablet (8 mg total) by mouth 2 (two) times daily as needed for refractory nausea / vomiting. Start on day 3 after cyclophosphamide chemotherapy. 60 tablet 2  . Oxycodone HCl 10 MG TABS Take 1 tablet (10 mg total) by mouth every 6 (six) hours as needed. 90 tablet 0  . predniSONE (DELTASONE) 20 MG tablet Take 5 tablets (100 mg total) by mouth daily. Take on days 1-5 of chemotherapy. 25 tablet 0  . prochlorperazine (COMPAZINE) 10 MG tablet Take 1 tablet (10 mg total) by mouth every 6 (six) hours as needed (Nausea or vomiting). 60 tablet 6  . tamsulosin (FLOMAX) 0.4 MG CAPS capsule Take 0.4 mg by mouth daily. PM    . warfarin (COUMADIN) 5 MG tablet Take 5 mg by mouth daily. Mon and Fri 7.5 mg, all other days 5 mg     No current facility-administered medications for this visit.     OBJECTIVE: Vitals:   03/18/17 0856  BP: 109/72  Pulse: (!) 101  Resp: 18   Temp: 97.8 F (36.6 C)     Body mass index is 32.14 kg/m.    ECOG FS:1 - Symptomatic but completely ambulatory  General: Well-developed, well-nourished, no acute distress. Eyes: Pink conjunctiva, anicteric sclera. Lungs: Clear to auscultation bilaterally. Heart: Regular rate and rhythm. No rubs, murmurs, or gallops. Abdomen: Soft, nontender, nondistended. No organomegaly noted, normoactive bowel sounds. Musculoskeletal: No edema, cyanosis, or clubbing. Neuro: Alert, answering all questions appropriately. Cranial nerves grossly intact. Skin: No rashes or petechiae noted. Psych: Normal affect. Lymphatics: No cervical, calvicular, axillary or inguinal LAD.   LAB RESULTS:  Lab Results  Component Value Date   NA 137 03/18/2017   K 4.1 03/18/2017   CL 103 03/18/2017   CO2 28 03/18/2017   GLUCOSE 124 (H) 03/18/2017   BUN 18 03/18/2017   CREATININE  0.95 03/18/2017   CALCIUM 9.5 03/18/2017   PROT 7.2 03/18/2017   ALBUMIN 3.9 03/18/2017   AST 23 03/18/2017   ALT 32 03/18/2017   ALKPHOS 118 03/18/2017   BILITOT 0.8 03/18/2017   GFRNONAA >60 03/18/2017   GFRAA >60 03/18/2017    Lab Results  Component Value Date   WBC 8.7 03/18/2017   NEUTROABS 7.1 (H) 03/18/2017   HGB 14.0 03/18/2017   HCT 40.2 03/18/2017   MCV 84.9 03/18/2017   PLT 290 03/18/2017     STUDIES: Dg Chest 2 View  Result Date: 03/04/2017 CLINICAL DATA:  Tachycardia.  History of atrial fibrillation. EXAM: CHEST  2 VIEW COMPARISON:  Chest CT 02/19/2017 and chest x-ray 06/26/2014 FINDINGS: The cardiac silhouette, mediastinal and hilar contours are within normal limits and stable. There is mild tortuosity of the thoracic aorta. Streaky bibasilar atelectasis but no definite infiltrates or effusions. Right paraspinal lesions seen on recent CT scan are difficult to identify on the chest x-ray. IMPRESSION: Streaky bibasilar atelectasis but no definite infiltrates or effusions. Electronically Signed   By: Marijo Sanes  M.D.   On: 03/04/2017 19:12   Ct Chest W Contrast  Result Date: 02/19/2017 CLINICAL DATA:  Pt presents to ED c/o pain in the right lower rib cage area since November. Pt states pain at 7/10 at this time; pt states pain has been getting worse over time. Pt with results of abdominal CT that was done in [place]. Attached to pt chart. Pt states pain 8/10, also reports nausea, denies vomiting, reports mild constipation. Also states he has lost 10lbs in the last week EXAM: CT CHEST WITH CONTRAST TECHNIQUE: Multidetector CT imaging of the chest was performed during intravenous contrast administration. CONTRAST:  56m ISOVUE-300 IOPAMIDOL (ISOVUE-300) INJECTION 61% COMPARISON:  Chest radiograph, 06/26/2014.  Chest CT, 02/12/2011. FINDINGS: Cardiovascular: No significant vascular findings. Normal heart size. No pericardial effusion. Mediastinum/Nodes: No enlarged mediastinal, hilar, or axillary lymph nodes. Thyroid gland, trachea, and esophagus demonstrate no significant findings. Lungs/Pleura: Minor atelectasis in the right middle lobe, left upper lobe lingula and posteromedial right lower lobe. No evidence of pneumonia or pulmonary edema. Two small subpleural pulmonary nodules in the left lower lobe, largest measuring 4 mm. No pleural effusion or pneumothorax. Upper Abdomen: Changes from previous gastric surgery. Status post cholecystectomy. No acute findings. Musculoskeletal: There is a lobulated soft tissue mass more confluent group of masses, along the right aspect of the thoracic spine, measuring a combined 8.7 cm from superior inferior by 5 x 1.9 cm transversely. There are several areas of mild, chronic appearing resorption of the adjacent vertebra the mass does not extend into a neural foramen or into the spinal canal. IMPRESSION: 1. Lobulated soft tissue mass along the right thoracic spine, which does not appear to involve the neural foramina or spinal canal. The differential diagnosis includes neurogenic tumors  as well as lymphoma. This could be further assessed, if desired clinically, with thoracic MRI with and without contrast. This finding is likely the cause of the patient's right sided chest wall pain. 2. No acute findings within the chest. Electronically Signed   By: DLajean ManesM.D.   On: 02/19/2017 20:03   Nm Cardiac Muga Rest  Result Date: 03/16/2017 CLINICAL DATA:  Large cell lymphoma, high risk chemotherapy EXAM: NUCLEAR MEDICINE CARDIAC BLOOD POOL IMAGING (MUGA) TECHNIQUE: Cardiac multi-gated acquisition was performed at rest following intravenous injection of Tc-956mabeled red blood cells. RADIOPHARMACEUTICALS:  21.63 mCi Tc-9922mrtechnetate in-vitro labeled red blood cells IV  COMPARISON:  None. FINDINGS: LEFT ventricular ejection fraction is calculated at 66%, within the normal range. Study was obtained at a cardiac rate of 69 beats per minute. Patient was rhythmic during imaging. Wall motion analysis of the LEFT ventricle in 3 projections is normal IMPRESSION: Normal LEFT ventricular ejection fraction of 66%. Normal LV wall motion. Electronically Signed   By: Lavonia Dana M.D.   On: 03/16/2017 15:49   Nm Pet Image Initial (pi) Skull Base To Thigh  Result Date: 03/01/2017 CLINICAL DATA:  Initial treatment strategy for lung mass. EXAM: NUCLEAR MEDICINE PET SKULL BASE TO THIGH TECHNIQUE: 12.3 mCi F-18 FDG was injected intravenously. Full-ring PET imaging was performed from the skull base to thigh after the radiotracer. CT data was obtained and used for attenuation correction and anatomic localization. FASTING BLOOD GLUCOSE:  Value: 103 mg/dl COMPARISON:  CT chest 02/19/2017 and 02/12/2011. FINDINGS: NECK No hypermetabolic lymph nodes in the neck. CT images show no acute findings. CHEST Hypermetabolic extrapleural nodules are seen bilaterally, right greater than left. Largest nodule on the right measures 2.2 x 2.9 cm (CT image 126) and is contiguous with nodularity seen inferiorly, with an overall SUV  max of 34.3. No hypermetabolic mediastinal, hilar or axillary lymph nodes. No hypermetabolic pulmonary nodules. Heart is enlarged. No pericardial or pleural effusion. ABDOMEN/PELVIS Note is made of misregistration artifact in the abdomen. No abnormal hypermetabolism in the liver, adrenal glands, spleen or pancreas. There are scattered hypermetabolic peritoneal nodules with an index nodule in the right lower quadrant measuring 1.7 cm (CT image 213) and SUV max of 20.8. Hypermetabolic left periaortic lymph node is seen just above the bifurcation, measuring 2.6 x 2.8 cm with an SUV max of 28.1. Right inguinal lymph node measures 10 mm (CT image 305) with an SUV max of 7.0. 8 mm subcutaneous nodule along the right lateral ventral abdominal wall (CT image 218) is mildly hypermetabolic. Liver, adrenal glands, kidneys, spleen and pancreas are grossly unremarkable. Cholecystectomy. Postoperative changes in the stomach. Bowel is grossly unremarkable. SKELETON No abnormal osseous hypermetabolism. IMPRESSION: 1. Hypermetabolic extrapleural lesions, peritoneal nodules and retroperitoneal/right inguinal lymph nodes, worrisome for lymphoma. 2. Mildly hypermetabolic subcutaneous nodule along the lower right lateral abdominal wall, nonspecific. Continued attention on followup exams is warranted. Electronically Signed   By: Lorin Picket M.D.   On: 03/01/2017 11:35   Ct Biopsy  Result Date: 03/17/2017 INDICATION: Diffuse large B-cell lymphoma EXAM: CT GUIDED RIGHT ILIAC BONE MARROW ASPIRATION AND CORE BIOPSY Date:  4/25/20184/25/2018 9:35 am Radiologist:  M. Daryll Brod, MD Guidance:  CT FLUOROSCOPY TIME:  Fluoroscopy Time: NONE. MEDICATIONS: None. ANESTHESIA/SEDATION: 3.0 mg IV Versed; 100 mcg IV Fentanyl Moderate Sedation Time:  15 minutes The patient was continuously monitored during the procedure by the interventional radiology nurse under my direct supervision. CONTRAST:  None. COMPLICATIONS: None PROCEDURE: Informed  consent was obtained from the patient following explanation of the procedure, risks, benefits and alternatives. The patient understands, agrees and consents for the procedure. All questions were addressed. A time out was performed. The patient was positioned prone and non-contrast localization CT was performed of the pelvis to demonstrate the iliac marrow spaces. Maximal barrier sterile technique utilized including caps, mask, sterile gowns, sterile gloves, large sterile drape, hand hygiene, and Betadine prep. Under sterile conditions and local anesthesia, an 11 gauge coaxial bone biopsy needle was advanced into the right iliac marrow space. Needle position was confirmed with CT imaging. Initially, bone marrow aspiration was performed. Next, the 11 gauge outer cannula  was utilized to obtain a right iliac bone marrow core biopsy. Needle was removed. Hemostasis was obtained with compression. The patient tolerated the procedure well. Samples were prepared with the cytotechnologist. No immediate complications. IMPRESSION: CT guided right iliac bone marrow aspiration and core biopsy. Electronically Signed   By: Jerilynn Mages.  Shick M.D.   On: 03/17/2017 09:58   Ct Biopsy  Result Date: 03/02/2017 INDICATION: Thoracic right paraspinal mass EXAM: CT BIOPSY MEDICATIONS: None. ANESTHESIA/SEDATION: Fentanyl 100 mcg IV; Versed 2 mg IV Moderate Sedation Time:  12 The patient was continuously monitored during the procedure by the interventional radiology nurse under my direct supervision. FLUOROSCOPY TIME:  None. COMPLICATIONS: None immediate. PROCEDURE: Informed written consent was obtained from the patient after a thorough discussion of the procedural risks, benefits and alternatives. All questions were addressed. Maximal Sterile Barrier Technique was utilized including caps, mask, sterile gowns, sterile gloves, sterile drape, hand hygiene and skin antiseptic. A timeout was performed prior to the initiation of the procedure. Under CT  guidance, a(n) 17 gauge guide needle was advanced into the right thoracic paraspinal mass. Subsequently 6 18 gauge core biopsies were obtained. The guide needle was removed. Post biopsy images demonstrate no hemorrhage. Patient tolerated the procedure well without complication. Vital sign monitoring by nursing staff during the procedure will continue as patient is in the special procedures unit for post procedure observation. FINDINGS: The images document guide needle placement within the right thoracic paraspinal mass. Post biopsy images demonstrate no hemorrhage. IMPRESSION: Successful CT-guided right paraspinal thoracic mass core biopsy. Electronically Signed   By: Marybelle Killings M.D.   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   PLAN:    1. DLBCL: PET scan results reviewed independently and reported as above confirming at least stage III disease. Bone marrow biopsy was completed yesterday to complete the staging workup, but results have not been finalized. Pretreatment MUGA scan on March 16, 2017 reported an EF of 66% repeat in 3 months. Proceed with cycle 1 of dose dense R CHOP treatment every 2 weeks for 6-8 cycles. Patient will also receive OnPro Neulasta support. Return to clinic in 1 week for laboratory work and further evaluation and then in 2 weeks for consideration of cycle 2.  2. Atrial fibrillation: Patient underwent RFA on December 21, 2016. He also had a negative nuclear medicine cardiac stress test on January 13, 2017. 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 Patient will require continued cardiac follow-up in [place].  3. Pain: Continue oxycodone 10 mg every 6 hours as needed. 4. Constipation: Patient was given a prescription for lactulose.  Approximately 30 minutes was spent in discussion of which greater than 50% was consultation.  Patient expressed understanding and was in agreement with this plan. He also understands that He can call clinic at  any time with any questions, concerns, or complaints.   Cancer Staging DLBCL (diffuse large B cell lymphoma) (HCC) Staging form: Hodgkin and Non-Hodgkin Lymphoma, AJCC 8th Edition - Clinical stage from 03/15/2017: Stage III (Diffuse large B-cell lymphoma) - Signed by Lloyd Huger, MD on 03/15/2017  Diffuse large B-cell lymphoma of lymph nodes of multiple regions [facility]) Staging form: Hodgkin and Non-Hodgkin Lymphoma, AJCC 8th Edition - Clinical stage from 03/17/2017: Stage III (Diffuse large B-cell lymphoma) - Signed by Lloyd Huger, MD on 03/17/2017   Lloyd Huger, MD   03/20/2017 5:09 PM

## 2017-03-17 NOTE — Consult Note (Signed)
Chief Complaint:  Diffuse B-cell lymphoma, here for bone marrow biopsy    Referring Physician(s): Finnegan,Timothy J   History of Present Illness: Nicholas Reynolds is a 66 y.o. male with a prior history of atrial fibrillation, on chronic Coumadin for anticoagulation. Patient had a recent lymph node biopsy demonstrating diffuse B-cell lymphoma. He was today for a CT-guided bone marrow biopsy for further workup and evaluation. No current chest or abdominal pain. No fevers. No specific complaints this morning.  Past Medical History:  Diagnosis Date  . Arthritis    thumbs  . Asymptomatic varicose veins of bilateral lower extremities    Uncomfortable but in no ulcers  . Cancer ([place])   . Complication of anesthesia    slow to wake after hernia surg.  was taking "a bunch" of herbals at that time  . Coronary artery disease, non-occlusive July 2008   Nonobstructive by cath   . Family history of premature coronary artery disease    Father  . GERD (gastroesophageal reflux disease)   . 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 Obstructive sleep apnea    severe per study 09/05/14   . Paroxysmal atrial fibrillation ([place]) 2002   a) Initially diagnosed as [place] Fib while in [place] (on ASA & BB after) - was on warfarin for a couple years and a Flecainide Pill-in Pocket PRN; b) recurrent A. fib with RVR August 2050 - status post cardioversion with flecainide, on low-dose beta blocker  . Pre-syncope May 2015   Unrevealing monitor in May  . Uses hearing aid    bilateral  . Wears dentures    partial upper    Past Surgical History:  Procedure Laterality Date  . CARDIAC  CATHETERIZATION  2008   Nonobstructive CAD  . CHOLECYSTECTOMY    . ELBOW SURGERY    . Event monitor  May 2015   Unrevealing  . HERNIA REPAIR    . KNEE ARTHROSCOPY Left 06/28/2015   Procedure: ARTHROSCOPY KNEE WITH SYNOVECTOMY;  Surgeon: Leanor Kail, MD;  Location: [facility];  Service: Orthopedics;  Laterality: Left;  CPAP  . [place] RESECTION     December 20 14th  . NM MYOVIEW LTD  June 28, 2014   [facility]: [facility] -- no evidence of ischemia or infarction. Normal EF ~58%  . PORTA CATH INSERTION N/A 03/15/2017   Procedure: Glori Luis Cath Insertion;  Surgeon: Algernon Huxley, MD;  Location: [facility];  Service: Cardiovascular;  Laterality: N/A;  . RADIOFREQUENCY ABLATION of Ventricular Tachycardia  July 2000 the   RVOT VT ablation: [facility], [place]    . TRANSTHORACIC ECHOCARDIOGRAM  May 2015   [facility] - Dr. Sharyn Lull: EF 55%, mild LVH; otherwise mostly normal    Allergies: Patient has no known allergies.  Medications: Prior to Admission medications   Medication Sig Start Date End Date Taking? Authorizing Provider  metoprolol succinate (TOPROL-XL) 50 MG 24 hr tablet Take 50 mg by mouth 2 (two) times daily. Take with or immediately following a meal.   Yes Historical Provider, MD  Multiple Vitamin (MULTIVITAMIN) tablet Take 1 tablet by mouth daily.   Yes Historical  Provider, MD  omeprazole (PRILOSEC) 40 MG capsule Take 40 mg by mouth 2 (two) times daily.   Yes Historical Provider, MD  ondansetron (ZOFRAN) 4 MG tablet Take 1 tablet (4 mg total) by mouth every 4 (four) hours as needed for nausea or vomiting. 03/03/17  Yes Cammie Sickle, MD  Oxycodone HCl 10 MG TABS Take 1 tablet (10 mg total) by mouth every 6 (six) hours as needed. 03/09/17  Yes Lloyd Huger, MD  tamsulosin (FLOMAX) 0.4 MG CAPS capsule Take 0.4 mg by mouth daily. PM   Yes Historical Provider, MD  warfarin (COUMADIN) 5 MG  tablet Take 5 mg by mouth daily. Mon and Fri 7.5 mg, all other days 5 mg    Historical Provider, MD     Family History  Problem Relation Age of Onset  . Breast cancer Mother   . Bone cancer Mother   . COPD Father   . Arrhythmia Father   . Heart failure Father   . Heart attack Father     Social History   Social History  . Marital status: Married    Spouse name: N/A  . Number of children: N/A  . Years of education: N/A   Social History Main Topics  . Smoking status: Former Smoker    Quit date: 11/24/1999  . Smokeless tobacco: Never Used  . Alcohol use No  . Drug use: No  . Sexual activity: Not Asked   Other Topics Concern  . None   Social History Narrative   Former smoker who quit over 15 years ago and does not drink alcohol or use drugs.   He is married with 2 children. Lives in [place], [place].  Works as an Chief Financial Officer for [employer].    ECOG Status: 0 - Asymptomatic  Review of Systems: A 12 point ROS discussed and pertinent positives are indicated in the HPI above.  All other systems are negative.  Review of Systems  Vital Signs: BP 115/74   Pulse 83   Temp 98 F (36.7 C)   Resp 13   SpO2 96%   Physical Exam  Constitutional: He is oriented to person, place, and time. He appears well-developed and well-nourished. No distress.  Eyes: Conjunctivae are normal. No scleral icterus.  Cardiovascular: Normal rate and regular rhythm.   No murmur heard. Pulmonary/Chest: Effort normal and breath sounds normal. No respiratory distress.  Abdominal: Soft. Bowel sounds are normal. He exhibits no distension and no mass.  Musculoskeletal: He exhibits no edema or tenderness.  Neurological: He is alert and oriented to person, place, and time.  Skin: Skin is warm and dry. He is not diaphoretic.  Psychiatric: He has a normal mood and affect. His behavior is normal.    Mallampati Score:   2  Imaging: Dg Chest 2 View  Result Date: 03/04/2017 CLINICAL DATA:  Tachycardia.  History of  atrial fibrillation. EXAM: CHEST  2 VIEW COMPARISON:  Chest CT 02/19/2017 and chest x-ray 06/26/2014 FINDINGS: The cardiac silhouette, mediastinal and hilar contours are within normal limits and stable. There is mild tortuosity of the thoracic aorta. Streaky bibasilar atelectasis but no definite infiltrates or effusions. Right paraspinal lesions seen on recent CT scan are difficult to identify on the chest x-ray. IMPRESSION: Streaky bibasilar atelectasis but no definite infiltrates or effusions. Electronically Signed   By: Marijo Sanes M.D.   On: 03/04/2017 19:12   Ct Chest W Contrast  Result Date: 02/19/2017 CLINICAL DATA:  Pt presents to ED c/o pain in  the right lower rib cage area since November. Pt states pain at 7/10 at this time; pt states pain has been getting worse over time. Pt with results of abdominal CT that was done in [place]. Attached to pt chart. Pt states pain 8/10, also reports nausea, denies vomiting, reports mild constipation. Also states he has lost 10lbs in the last week EXAM: CT CHEST WITH CONTRAST TECHNIQUE: Multidetector CT imaging of the chest was performed during intravenous contrast administration. CONTRAST:  35m ISOVUE-300 IOPAMIDOL (ISOVUE-300) INJECTION 61% COMPARISON:  Chest radiograph, 06/26/2014.  Chest CT, 02/12/2011. FINDINGS: Cardiovascular: No significant vascular findings. Normal heart size. No pericardial effusion. Mediastinum/Nodes: No enlarged mediastinal, hilar, or axillary lymph nodes. Thyroid gland, trachea, and esophagus demonstrate no significant findings. Lungs/Pleura: Minor atelectasis in the right middle lobe, left upper lobe lingula and posteromedial right lower lobe. No evidence of pneumonia or pulmonary edema. Two small subpleural pulmonary nodules in the left lower lobe, largest measuring 4 mm. No pleural effusion or pneumothorax. Upper Abdomen: Changes from previous gastric surgery. Status post cholecystectomy. No acute findings. Musculoskeletal: There is  a lobulated soft tissue mass more confluent group of masses, along the right aspect of the thoracic spine, measuring a combined 8.7 cm from superior inferior by 5 x 1.9 cm transversely. There are several areas of mild, chronic appearing resorption of the adjacent vertebra the mass does not extend into a neural foramen or into the spinal canal. IMPRESSION: 1. Lobulated soft tissue mass along the right thoracic spine, which does not appear to involve the neural foramina or spinal canal. The differential diagnosis includes neurogenic tumors as well as lymphoma. This could be further assessed, if desired clinically, with thoracic MRI with and without contrast. This finding is likely the cause of the patient's right sided chest wall pain. 2. No acute findings within the chest. Electronically Signed   By: DLajean ManesM.D.   On: 02/19/2017 20:03   Nm Cardiac Muga Rest  Result Date: 03/16/2017 CLINICAL DATA:  Large cell lymphoma, high risk chemotherapy EXAM: NUCLEAR MEDICINE CARDIAC BLOOD POOL IMAGING (MUGA) TECHNIQUE: Cardiac multi-gated acquisition was performed at rest following intravenous injection of Tc-940mabeled red blood cells. RADIOPHARMACEUTICALS:  21.63 mCi Tc-9912mrtechnetate in-vitro labeled red blood cells IV COMPARISON:  None. FINDINGS: LEFT ventricular ejection fraction is calculated at 66%, within the normal range. Study was obtained at a cardiac rate of 69 beats per minute. Patient was rhythmic during imaging. Wall motion analysis of the LEFT ventricle in 3 projections is normal IMPRESSION: Normal LEFT ventricular ejection fraction of 66%. Normal LV wall motion. Electronically Signed   By: MarLavonia DanaD.   On: 03/16/2017 15:49   Nm Pet Image Initial (pi) Skull Base To Thigh  Result Date: 03/01/2017 CLINICAL DATA:  Initial treatment strategy for lung mass. EXAM: NUCLEAR MEDICINE PET SKULL BASE TO THIGH TECHNIQUE: 12.3 mCi F-18 FDG was injected intravenously. Full-ring PET imaging was performed  from the skull base to thigh after the radiotracer. CT data was obtained and used for attenuation correction and anatomic localization. FASTING BLOOD GLUCOSE:  Value: 103 mg/dl COMPARISON:  CT chest 02/19/2017 and 02/12/2011. FINDINGS: NECK No hypermetabolic lymph nodes in the neck. CT images show no acute findings. CHEST Hypermetabolic extrapleural nodules are seen bilaterally, right greater than left. Largest nodule on the right measures 2.2 x 2.9 cm (CT image 126) and is contiguous with nodularity seen inferiorly, with an overall SUV max of 34.3. No hypermetabolic mediastinal, hilar or axillary lymph nodes. No hypermetabolic  pulmonary nodules. Heart is enlarged. No pericardial or pleural effusion. ABDOMEN/PELVIS Note is made of misregistration artifact in the abdomen. No abnormal hypermetabolism in the liver, adrenal glands, spleen or pancreas. There are scattered hypermetabolic peritoneal nodules with an index nodule in the right lower quadrant measuring 1.7 cm (CT image 213) and SUV max of 20.8. Hypermetabolic left periaortic lymph node is seen just above the bifurcation, measuring 2.6 x 2.8 cm with an SUV max of 28.1. Right inguinal lymph node measures 10 mm (CT image 305) with an SUV max of 7.0. 8 mm subcutaneous nodule along the right lateral ventral abdominal wall (CT image 218) is mildly hypermetabolic. Liver, adrenal glands, kidneys, spleen and pancreas are grossly unremarkable. Cholecystectomy. Postoperative changes in the stomach. Bowel is grossly unremarkable. SKELETON No abnormal osseous hypermetabolism. IMPRESSION: 1. Hypermetabolic extrapleural lesions, peritoneal nodules and retroperitoneal/right inguinal lymph nodes, worrisome for lymphoma. 2. Mildly hypermetabolic subcutaneous nodule along the lower right lateral abdominal wall, nonspecific. Continued attention on followup exams is warranted. Electronically Signed   By: Lorin Picket M.D.   On: 03/01/2017 11:35   Ct Biopsy  Result Date:  03/02/2017 INDICATION: Thoracic right paraspinal mass EXAM: CT BIOPSY MEDICATIONS: None. ANESTHESIA/SEDATION: Fentanyl 100 mcg IV; Versed 2 mg IV Moderate Sedation Time:  12 The patient was continuously monitored during the procedure by the interventional radiology nurse under my direct supervision. FLUOROSCOPY TIME:  None. COMPLICATIONS: None immediate. PROCEDURE: Informed written consent was obtained from the patient after a thorough discussion of the procedural risks, benefits and alternatives. All questions were addressed. Maximal Sterile Barrier Technique was utilized including caps, mask, sterile gowns, sterile gloves, sterile drape, hand hygiene and skin antiseptic. A timeout was performed prior to the initiation of the procedure. Under CT guidance, a(n) 17 gauge guide needle was advanced into the right thoracic paraspinal mass. Subsequently 6 18 gauge core biopsies were obtained. The guide needle was removed. Post biopsy images demonstrate no hemorrhage. Patient tolerated the procedure well without complication. Vital sign monitoring by nursing staff during the procedure will continue as patient is in the special procedures unit for post procedure observation. FINDINGS: The images document guide needle placement within the right thoracic paraspinal mass. Post biopsy images demonstrate no hemorrhage. IMPRESSION: Successful CT-guided right paraspinal thoracic mass core biopsy. Electronically Signed   By: Marybelle Killings M.D.   On: 03/02/2017 15:49    Labs:  CBC:  Recent Labs  02/19/17 1805 03/02/17 1303 03/04/17 1836 03/17/17 0717  WBC 7.5 8.1 8.2 7.7  HGB 15.0 15.7 15.4 14.5  HCT 44.6 46.9 46.4 43.0  PLT 258 286 324 284    COAGS:  Recent Labs  03/02/17 1303 03/04/17 1931 03/17/17 0717  INR 1.10 1.11 1.43  APTT 32  --  40*    BMP:  Recent Labs  05/29/16 1517 02/19/17 1805 03/04/17 1836  NA 140 139 136  K 4.0 3.8 4.2  CL 107 106 104  CO2 _0 GLUCOSE 87 102* 111*  BUN  29* 16 13  CALCIUM 9.2 9.5 9.2  CREATININE 1.01 1.11 0.82  GFRNONAA >60 >60 >60  GFRAA >60 >60 >60    LIVER FUNCTION TESTS:  Recent Labs  02/19/17 1805  BILITOT 0.7  AST 23  ALT 19  ALKPHOS 91  PROT 7.7  ALBUMIN 4.3    TUMOR MARKERS: No results for input(s): AFPTM, CEA, CA199, CHROMGRNA in the last 8760 hours.  Assessment and Plan:  Recent nodal biopsy positive for B-cell lymphoma. Plan for  bone marrow biopsy today for further workup and evaluation.  The procedure, risks, benefits and alternatives were reviewed. All questions addressed. Patient has a clear understanding of the procedure. Consent obtained.  Risks and Benefits discussed with the patient including, but not limited to bleeding, infection, damage to adjacent structures or low yield requiring additional tests. All of the patient's questions were answered, patient is agreeable to proceed. Consent signed and in chart.    Thank you for this interesting consult.  I greatly enjoyed meeting Nicholas Reynolds and look forward to participating in their care.  A copy of this report was sent to the requesting provider on this date.  Electronically Signed: Greggory Keen 03/17/2017, 8:19 AM   I spent a total of  15 Minutes   in face to face in clinical consultation, greater than 50% of which was counseling/coordinating care for this patient with B-cell lymphoma.

## 2017-03-18 ENCOUNTER — Inpatient Hospital Stay: Payer: 59

## 2017-03-18 ENCOUNTER — Inpatient Hospital Stay (HOSPITAL_BASED_OUTPATIENT_CLINIC_OR_DEPARTMENT_OTHER): Payer: 59 | Admitting: Oncology

## 2017-03-18 VITALS — BP 114/62 | HR 73 | Resp 20

## 2017-03-18 VITALS — BP 109/72 | HR 101 | Temp 97.8°F | Resp 18 | Wt 271.0 lb

## 2017-03-18 DIAGNOSIS — C8338 Diffuse large B-cell lymphoma, lymph nodes of multiple sites: Secondary | ICD-10-CM | POA: Diagnosis not present

## 2017-03-18 DIAGNOSIS — I480 Paroxysmal atrial fibrillation: Secondary | ICD-10-CM

## 2017-03-18 DIAGNOSIS — Z79899 Other long term (current) drug therapy: Secondary | ICD-10-CM

## 2017-03-18 DIAGNOSIS — Z7901 Long term (current) use of anticoagulants: Secondary | ICD-10-CM

## 2017-03-18 DIAGNOSIS — K5900 Constipation, unspecified: Secondary | ICD-10-CM | POA: Diagnosis not present

## 2017-03-18 LAB — CBC WITH DIFFERENTIAL/PLATELET
BASOS PCT: 1 %
Basophils Absolute: 0 10*3/uL (ref 0–0.1)
EOS PCT: 1 %
Eosinophils Absolute: 0.1 10*3/uL (ref 0–0.7)
HCT: 40.2 % (ref 40.0–52.0)
Hemoglobin: 14 g/dL (ref 13.0–18.0)
LYMPHS PCT: 9 %
Lymphs Abs: 0.8 10*3/uL — ABNORMAL LOW (ref 1.0–3.6)
MCH: 29.6 pg (ref 26.0–34.0)
MCHC: 34.9 g/dL (ref 32.0–36.0)
MCV: 84.9 fL (ref 80.0–100.0)
MONO ABS: 0.7 10*3/uL (ref 0.2–1.0)
MONOS PCT: 8 %
NEUTROS ABS: 7.1 10*3/uL — AB (ref 1.4–6.5)
Neutrophils Relative %: 81 %
PLATELETS: 290 10*3/uL (ref 150–440)
RBC: 4.74 MIL/uL (ref 4.40–5.90)
RDW: 13.9 % (ref 11.5–14.5)
WBC: 8.7 10*3/uL (ref 3.8–10.6)

## 2017-03-18 LAB — COMPREHENSIVE METABOLIC PANEL
ALBUMIN: 3.9 g/dL (ref 3.5–5.0)
ALT: 32 U/L (ref 17–63)
ANION GAP: 6 (ref 5–15)
AST: 23 U/L (ref 15–41)
Alkaline Phosphatase: 118 U/L (ref 38–126)
BUN: 18 mg/dL (ref 6–20)
CO2: 28 mmol/L (ref 22–32)
Calcium: 9.5 mg/dL (ref 8.9–10.3)
Chloride: 103 mmol/L (ref 101–111)
Creatinine, Ser: 0.95 mg/dL (ref 0.61–1.24)
GFR calc Af Amer: >60 mL/min (ref >60)
GFR calc non Af Amer: >60 mL/min (ref >60)
GLUCOSE: 124 mg/dL — AB (ref 65–99)
POTASSIUM: 4.1 mmol/L (ref 3.5–5.1)
SODIUM: 137 mmol/L (ref 135–145)
Total Bilirubin: 0.8 mg/dL (ref 0.3–1.2)
Total Protein: 7.2 g/dL (ref 6.5–8.1)

## 2017-03-18 MED ORDER — DIPHENHYDRAMINE HCL 25 MG PO CAPS
25.0000 mg | ORAL_CAPSULE | Freq: Once | ORAL | Status: AC
  Administered 2017-03-18: 25 mg via ORAL
  Filled 2017-03-18: qty 1

## 2017-03-18 MED ORDER — PEGFILGRASTIM 6 MG/0.6ML ~~LOC~~ PSKT
6.0000 mg | PREFILLED_SYRINGE | Freq: Once | SUBCUTANEOUS | Status: AC
  Administered 2017-03-18: 6 mg via SUBCUTANEOUS
  Filled 2017-03-18: qty 0.6

## 2017-03-18 MED ORDER — DOXORUBICIN HCL CHEMO IV INJECTION 2 MG/ML
50.0000 mg/m2 | Freq: Once | INTRAVENOUS | Status: AC
  Administered 2017-03-18: 132 mg via INTRAVENOUS
  Filled 2017-03-18: qty 66

## 2017-03-18 MED ORDER — ACETAMINOPHEN 325 MG PO TABS
650.0000 mg | ORAL_TABLET | Freq: Once | ORAL | Status: AC
  Administered 2017-03-18: 650 mg via ORAL
  Filled 2017-03-18: qty 2

## 2017-03-18 MED ORDER — SODIUM CHLORIDE 0.9 % IV SOLN
375.0000 mg/m2 | Freq: Once | INTRAVENOUS | Status: AC
  Administered 2017-03-18: 1000 mg via INTRAVENOUS
  Filled 2017-03-18: qty 100

## 2017-03-18 MED ORDER — HYDROCORTISONE NA SUCCINATE PF 100 MG IJ SOLR
100.0000 mg | Freq: Once | INTRAMUSCULAR | Status: AC
  Administered 2017-03-18: 100 mg via INTRAVENOUS

## 2017-03-18 MED ORDER — SODIUM CHLORIDE 0.9% FLUSH
10.0000 mL | Freq: Once | INTRAVENOUS | Status: AC
  Administered 2017-03-18: 10 mL via INTRAVENOUS
  Filled 2017-03-18: qty 10

## 2017-03-18 MED ORDER — SODIUM CHLORIDE 0.9 % IV SOLN
2000.0000 mg | Freq: Once | INTRAVENOUS | Status: AC
  Administered 2017-03-18: 2000 mg via INTRAVENOUS
  Filled 2017-03-18: qty 100

## 2017-03-18 MED ORDER — PALONOSETRON HCL INJECTION 0.25 MG/5ML
0.2500 mg | Freq: Once | INTRAVENOUS | Status: AC
  Administered 2017-03-18: 0.25 mg via INTRAVENOUS
  Filled 2017-03-18: qty 5

## 2017-03-18 MED ORDER — DEXAMETHASONE SODIUM PHOSPHATE 100 MG/10ML IJ SOLN: 10.0000 mg | Freq: Once | INTRAMUSCULAR | Status: DC

## 2017-03-18 MED ORDER — HEPARIN SOD (PORK) LOCK FLUSH 100 UNIT/ML IV SOLN
500.0000 [IU] | Freq: Once | INTRAVENOUS | Status: AC
Start: 2017-03-18 — End: 2017-03-18
  Administered 2017-03-18: 500 [IU] via INTRAVENOUS
  Filled 2017-03-18: qty 5

## 2017-03-18 MED ORDER — VINCRISTINE SULFATE CHEMO INJECTION 1 MG/ML
2.0000 mg | Freq: Once | INTRAVENOUS | Status: AC
  Administered 2017-03-18: 2 mg via INTRAVENOUS
  Filled 2017-03-18: qty 2

## 2017-03-18 MED ORDER — DIPHENHYDRAMINE HCL 50 MG/ML IJ SOLN
25.0000 mg | Freq: Once | INTRAMUSCULAR | Status: AC | PRN
  Administered 2017-03-18: 25 mg via INTRAVENOUS

## 2017-03-18 MED ORDER — DEXAMETHASONE SODIUM PHOSPHATE 10 MG/ML IJ SOLN
10.0000 mg | Freq: Once | INTRAMUSCULAR | Status: AC
  Administered 2017-03-18: 10 mg via INTRAVENOUS
  Filled 2017-03-18: qty 1

## 2017-03-18 MED ORDER — SODIUM CHLORIDE 0.9 % IV SOLN
Freq: Once | INTRAVENOUS | Status: AC
  Administered 2017-03-18: 10:00:00 via INTRAVENOUS
  Filled 2017-03-18: qty 1000

## 2017-03-18 NOTE — Progress Notes (Signed)
Nervous about new treatment today. Offers no complaints.

## 2017-03-18 NOTE — Progress Notes (Signed)
1424- Pt complaining of itching, red areas noted on back of head, side of face and chest. Rituxan infusing at 200mg /hr. Infusion stopped, IVF increased to 950ml/hr. Per Dr Grayland Ormond give  25mg  IV Benadryl and 100mg  Solu cortef IV, if patient stops itching restart at 150mg /hr.  VSS. Sats 95% on room air. No other complaints voiced by patient. Will continue to monitor.  1445-Pt states no further itching. Rituxan restarted at 150mg /hr. Will continue to monitor.  1525-Increased rate to 200mg /hr at 1500. Pt complaining of itching at 1520 but not as severe. Red area on side of face and back of head noticed.  Notified Dr Grayland Ormond. Per Dr Grayland Ormond, keep infusion at 150mg /hr, which is rate patient is able to tolerate without itching. Will continue to monitor.  1555-Rash noted to chest and face, itching returned. Infusion stopped. IVF incresed. Notified Dr Grayland Ormond. Rituxan stopped. Will continue to monitor patient.   1634-Itching stopped, rash improved. Patient discharged home. On-Body Neulasta applied.

## 2017-03-19 ENCOUNTER — Telehealth: Payer: Self-pay | Admitting: *Deleted

## 2017-03-19 NOTE — Telephone Encounter (Signed)
Port site is bruised and slightly swollen, Had first treatment yesterday. I asked that he come in now to have an infusion nurse look at his port site. She states she will have him here as soon as possible

## 2017-03-19 NOTE — Telephone Encounter (Signed)
Thank you :)

## 2017-03-22 ENCOUNTER — Telehealth: Payer: Self-pay | Admitting: *Deleted

## 2017-03-22 NOTE — Telephone Encounter (Signed)
Called with concern about him taking 100 mg prednisone daily times 5 days and not being taperd off. Asking if he will be OK to just stop it.

## 2017-03-22 NOTE — Telephone Encounter (Signed)
Nicholas Reynolds per VO Dr Grayland Ormond that this is the treatment and he does not taper off of it, I also explained that this is the way it has been done for years

## 2017-03-24 NOTE — Progress Notes (Signed)
[place]  Telephone:([phone]) [phone] Fax:(336) [phone]  ID: Nicholas Reynolds OB: 1951-04-02  MR#: [id number]  JSR#:[id number]  Patient Care Team: Valera Castle, MD as PCP - General  CHIEF COMPLAINT: Stage III DLBCL.  INTERVAL HISTORY: Patient returns to clinic today for further evaluation and to assess his toleration of cycle 1 of R-CHOP chemotherapy. He felt well and asymptomatic until day 6 when he discontinued his prednisone. He currently feels increased weakness and fatigue, has a poor appetite and increased nausea. He continues to have problems with constipation. He admits his pain is better controlled. He continues to be anxious. He denies any fevers or illnesses. He has no neurologic complaints. He denies any shortness of breath, cough, or hemoptysis. He has no nausea, vomiting, or diarrhea. He has no urinary complaints. Patient feels generally terrible, but offers no further specific complaints.  REVIEW OF SYSTEMS:   Review of Systems  Constitutional: Positive for malaise/fatigue and weight loss. Negative for fever.  Respiratory: Negative for cough, hemoptysis and shortness of breath.   Cardiovascular: Positive for chest pain. Negative for leg swelling.  Gastrointestinal: Positive for abdominal pain and constipation. Negative for blood in stool, diarrhea, melena, nausea and vomiting.  Genitourinary: Negative.   Musculoskeletal: Positive for back pain.  Neurological: Negative for sensory change.  Psychiatric/Behavioral: The patient is nervous/anxious and has insomnia.     As per HPI. Otherwise, a complete review of systems is negative.  PAST MEDICAL HISTORY: Past Medical History:  Diagnosis Date  . Arthritis    thumbs  . Asymptomatic varicose veins of bilateral lower extremities    Uncomfortable but in no ulcers  . Cancer ([place])   . Complication of anesthesia    slow to wake after hernia surg.  was taking "a bunch" of herbals at that time  .  Coronary artery disease, non-occlusive July 2008   Nonobstructive by cath   . Family history of premature coronary artery disease    Father  . GERD (gastroesophageal reflux disease)   . 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 Obstructive sleep apnea    severe per study 09/05/14   . Paroxysmal atrial fibrillation ([place]) 2002   a) Initially diagnosed as [place] Fib while in [place] (on ASA & BB after) - was on warfarin for a couple years and a Flecainide Pill-in Pocket PRN; b) recurrent A. fib with RVR August 2050 - status post cardioversion with flecainide, on low-dose beta blocker  . Pre-syncope May 2015   Unrevealing monitor in May  . Uses hearing aid    bilateral  . Wears dentures    partial upper    PAST SURGICAL HISTORY: Past Surgical History:  Procedure Laterality Date  . CARDIAC CATHETERIZATION  2008   Nonobstructive CAD  . CHOLECYSTECTOMY    . ELBOW SURGERY    . Event monitor  May 2015   Unrevealing  . HERNIA REPAIR    . KNEE ARTHROSCOPY Left 06/28/2015   Procedure: ARTHROSCOPY KNEE WITH SYNOVECTOMY;  Surgeon: Leanor Kail, MD;  Location: [facility];  Service: Orthopedics;  Laterality: Left;  CPAP  . [place] RESECTION     December 20 14th  . [place]  June 28, 2014   [facility]: Lexiscan Myoview -- no evidence of ischemia or infarction. Normal EF ~58%  . PORTA CATH INSERTION N/A 03/15/2017   Procedure: Glori Luis Cath Insertion;  Surgeon: Algernon Huxley, MD;  Location: [facility];  Service: Cardiovascular;  Laterality: N/A;  . RADIOFREQUENCY ABLATION of Ventricular Tachycardia  July 2000 the   RVOT VT ablation: [facility],  [place]    . TRANSTHORACIC ECHOCARDIOGRAM  May 2015   [facility] - Dr. Sharyn Lull: EF 55%, mild LVH; otherwise mostly normal    FAMILY HISTORY: Family History  Problem Relation Age of Onset  . Breast cancer Mother   . Bone cancer Mother   . COPD Father   . Arrhythmia Father   . Heart failure Father   . Heart attack Father     ADVANCED DIRECTIVES (Y/N):  N  HEALTH MAINTENANCE: Social History  Substance Use Topics  . Smoking status: Former Smoker    Quit date: 11/24/1999  . Smokeless tobacco: Never Used  . Alcohol use No     Colonoscopy:  PAP:  Bone density:  Lipid panel:  No Known Allergies  Current Outpatient Prescriptions  Medication Sig Dispense Refill  . allopurinol (ZYLOPRIM) 300 MG tablet Take 1 tablet (300 mg total) by mouth daily. 30 tablet 3  . lidocaine-prilocaine (EMLA) cream Apply to affected area once 30 g 3  . metoprolol succinate (TOPROL-XL) 50 MG 24 hr tablet Take 50 mg by mouth 2 (two) times daily. Take with or immediately following a meal.    . Multiple Vitamin (MULTIVITAMIN) tablet Take 1 tablet by mouth daily.    Marland Kitchen omeprazole (PRILOSEC) 40 MG capsule Take 40 mg by mouth 2 (two) times daily.    . ondansetron (ZOFRAN) 8 MG tablet Take 1 tablet (8 mg total) by mouth 2 (two) times daily as needed for refractory nausea / vomiting. Start on day 3 after cyclophosphamide chemotherapy. 60 tablet 2  . Oxycodone HCl 10 MG TABS Take 1 tablet (10 mg total) by mouth every 6 (six) hours as needed. 90 tablet 0  . prochlorperazine (COMPAZINE) 10 MG tablet Take 1 tablet (10 mg total) by mouth every 6 (six) hours as needed (Nausea or vomiting). 60 tablet 6  . tamsulosin (FLOMAX) 0.4 MG CAPS capsule Take 0.4 mg by mouth daily. PM    . warfarin (COUMADIN) 5 MG tablet Take 5 mg by mouth daily. Mon and Fri 7.5 mg, all other days 5 mg     No current facility-administered medications for this visit.     OBJECTIVE: Vitals:   03/25/17 1455  03/25/17 1456  BP: (!) 80/60   Pulse:  88  Temp:       Body mass index is 31.95 kg/m.    ECOG FS:1 - Symptomatic but completely ambulatory  General: Well-developed, well-nourished, no acute distress. Eyes: Pink conjunctiva, anicteric sclera. Lungs: Clear to auscultation bilaterally. Heart: Regular rate and rhythm. No rubs, murmurs, or gallops. Abdomen: Soft, nontender, nondistended. No organomegaly noted, normoactive bowel sounds. Musculoskeletal: No edema, cyanosis, or clubbing. Neuro: Alert, answering all questions appropriately. Cranial nerves grossly intact. Skin: No rashes or petechiae noted. Psych: Normal affect. Lymphatics: No cervical, calvicular, axillary or inguinal LAD.   LAB RESULTS:  Lab Results  Component Value Date   NA 133 (L) 03/25/2017   K 4.2 03/25/2017   CL 102 03/25/2017   CO2 25 03/25/2017   GLUCOSE 115 (H) 03/25/2017   BUN 21 (H)  03/25/2017   CREATININE 0.77 03/25/2017   CALCIUM 9.0 03/25/2017   PROT 6.8 03/25/2017   ALBUMIN 3.5 03/25/2017   AST 19 03/25/2017   ALT 38 03/25/2017   ALKPHOS 122 03/25/2017   BILITOT 1.4 (H) 03/25/2017   GFRNONAA >60 03/25/2017   GFRAA >60 03/25/2017    Lab Results  Component Value Date   WBC 0.6 (LL) 03/25/2017   NEUTROABS 0.2 (L) 03/25/2017   HGB 13.7 03/25/2017   HCT 40.2 03/25/2017   MCV 84.8 03/25/2017   PLT 136 (L) 03/25/2017     STUDIES: Dg Chest 2 View  Result Date: 03/04/2017 CLINICAL DATA:  Tachycardia.  History of atrial fibrillation. EXAM: CHEST  2 VIEW COMPARISON:  Chest CT 02/19/2017 and chest x-ray 06/26/2014 FINDINGS: The cardiac silhouette, mediastinal and hilar contours are within normal limits and stable. There is mild tortuosity of the thoracic aorta. Streaky bibasilar atelectasis but no definite infiltrates or effusions. Right paraspinal lesions seen on recent CT scan are difficult to identify on the chest x-ray. IMPRESSION: Streaky bibasilar atelectasis but no definite infiltrates or  effusions. Electronically Signed   By: Marijo Sanes M.D.   On: 03/04/2017 19:12   Nm Cardiac Muga Rest  Result Date: 03/16/2017 CLINICAL DATA:  Large cell lymphoma, high risk chemotherapy EXAM: NUCLEAR MEDICINE CARDIAC BLOOD POOL IMAGING (MUGA) TECHNIQUE: Cardiac multi-gated acquisition was performed at rest following intravenous injection of Tc-64mlabeled red blood cells. RADIOPHARMACEUTICALS:  21.63 mCi Tc-964mertechnetate in-vitro labeled red blood cells IV COMPARISON:  None. FINDINGS: LEFT ventricular ejection fraction is calculated at 66%, within the normal range. Study was obtained at a cardiac rate of 69 beats per minute. Patient was rhythmic during imaging. Wall motion analysis of the LEFT ventricle in 3 projections is normal IMPRESSION: Normal LEFT ventricular ejection fraction of 66%. Normal LV wall motion. Electronically Signed   By: MaLavonia Dana.D.   On: 03/16/2017 15:49   Nm Pet Image Initial (pi) Skull Base To Thigh  Result Date: 03/01/2017 CLINICAL DATA:  Initial treatment strategy for lung mass. EXAM: NUCLEAR MEDICINE PET SKULL BASE TO THIGH TECHNIQUE: 12.3 mCi F-18 FDG was injected intravenously. Full-ring PET imaging was performed from the skull base to thigh after the radiotracer. CT data was obtained and used for attenuation correction and anatomic localization. FASTING BLOOD GLUCOSE:  Value: 103 mg/dl COMPARISON:  CT chest 02/19/2017 and 02/12/2011. FINDINGS: NECK No hypermetabolic lymph nodes in the neck. CT images show no acute findings. CHEST Hypermetabolic extrapleural nodules are seen bilaterally, right greater than left. Largest nodule on the right measures 2.2 x 2.9 cm (CT image 126) and is contiguous with nodularity seen inferiorly, with an overall SUV max of 34.3. No hypermetabolic mediastinal, hilar or axillary lymph nodes. No hypermetabolic pulmonary nodules. Heart is enlarged. No pericardial or pleural effusion. ABDOMEN/PELVIS Note is made of misregistration artifact in  the abdomen. No abnormal hypermetabolism in the liver, adrenal glands, spleen or pancreas. There are scattered hypermetabolic peritoneal nodules with an index nodule in the right lower quadrant measuring 1.7 cm (CT image 213) and SUV max of 20.8. Hypermetabolic left periaortic lymph node is seen just above the bifurcation, measuring 2.6 x 2.8 cm with an SUV max of 28.1. Right inguinal lymph node measures 10 mm (CT image 305) with an SUV max of 7.0. 8 mm subcutaneous nodule along the right lateral ventral abdominal wall (CT image 218) is mildly hypermetabolic. Liver, adrenal glands, kidneys, spleen and pancreas are grossly unremarkable. Cholecystectomy. Postoperative changes in the  stomach. Bowel is grossly unremarkable. SKELETON No abnormal osseous hypermetabolism. IMPRESSION: 1. Hypermetabolic extrapleural lesions, peritoneal nodules and retroperitoneal/right inguinal lymph nodes, worrisome for lymphoma. 2. Mildly hypermetabolic subcutaneous nodule along the lower right lateral abdominal wall, nonspecific. Continued attention on followup exams is warranted. Electronically Signed   By: Lorin Picket M.D.   On: 03/01/2017 11:35   Ct Biopsy  Result Date: 03/17/2017 INDICATION: Diffuse large B-cell lymphoma EXAM: CT GUIDED RIGHT ILIAC BONE MARROW ASPIRATION AND CORE BIOPSY Date:  4/25/20184/25/2018 9:35 am Radiologist:  M. Daryll Brod, MD Guidance:  CT FLUOROSCOPY TIME:  Fluoroscopy Time: NONE. MEDICATIONS: None. ANESTHESIA/SEDATION: 3.0 mg IV Versed; 100 mcg IV Fentanyl Moderate Sedation Time:  15 minutes The patient was continuously monitored during the procedure by the interventional radiology nurse under my direct supervision. CONTRAST:  None. COMPLICATIONS: None PROCEDURE: Informed consent was obtained from the patient following explanation of the procedure, risks, benefits and alternatives. The patient understands, agrees and consents for the procedure. All questions were addressed. A time out was  performed. The patient was positioned prone and non-contrast localization CT was performed of the pelvis to demonstrate the iliac marrow spaces. Maximal barrier sterile technique utilized including caps, mask, sterile gowns, sterile gloves, large sterile drape, hand hygiene, and Betadine prep. Under sterile conditions and local anesthesia, an 11 gauge coaxial bone biopsy needle was advanced into the right iliac marrow space. Needle position was confirmed with CT imaging. Initially, bone marrow aspiration was performed. Next, the 11 gauge outer cannula was utilized to obtain a right iliac bone marrow core biopsy. Needle was removed. Hemostasis was obtained with compression. The patient tolerated the procedure well. Samples were prepared with the cytotechnologist. No immediate complications. IMPRESSION: CT guided right iliac bone marrow aspiration and core biopsy. Electronically Signed   By: Jerilynn Mages.  Shick M.D.   On: 03/17/2017 09:58   Ct Biopsy  Result Date: 03/02/2017 INDICATION: Thoracic right paraspinal mass EXAM: CT BIOPSY MEDICATIONS: None. ANESTHESIA/SEDATION: Fentanyl 100 mcg IV; Versed 2 mg IV Moderate Sedation Time:  12 The patient was continuously monitored during the procedure by the interventional radiology nurse under my direct supervision. FLUOROSCOPY TIME:  None. COMPLICATIONS: None immediate. PROCEDURE: Informed written consent was obtained from the patient after a thorough discussion of the procedural risks, benefits and alternatives. All questions were addressed. Maximal Sterile Barrier Technique was utilized including caps, mask, sterile gowns, sterile gloves, sterile drape, hand hygiene and skin antiseptic. A timeout was performed prior to the initiation of the procedure. Under CT guidance, a(n) 17 gauge guide needle was advanced into the right thoracic paraspinal mass. Subsequently 6 18 gauge core biopsies were obtained. The guide needle was removed. Post biopsy images demonstrate no hemorrhage.  Patient tolerated the procedure well without complication. Vital sign monitoring by nursing staff during the procedure will continue as patient is in the special procedures unit for post procedure observation. FINDINGS: The images document guide needle placement within the right thoracic paraspinal mass. Post biopsy images demonstrate no hemorrhage. IMPRESSION: Successful CT-guided right paraspinal thoracic mass core biopsy. Electronically Signed   By: Marybelle Killings M.D.   On: 03/02/2017 15:49    ASSESSMENT: Stage III DLBCL.  PLAN:    1. DLBCL: PET scan results reviewed independently and reported as above. Bone marrow biopsy was negative for lymphoma. Pretreatment MUGA scan on March 16, 2017 reported an EF of 66% repeat in July 2018. Patient tolerated cycle 1 of dose dense R-CHOP treatment asked week relatively well. We also discussed the possibility  of switching treatment every 3 weeks. Patient does not wish to do this at this time and wishes to continue in a dose dense fashion. He also received OnPro Neulasta support. Return to clinic in 1 week for consideration of cycle 2.  2. Atrial fibrillation: Patient underwent RFA on December 21, 2016. He also had a negative nuclear medicine cardiac stress test on January 13, 2017. 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 Patient will require continued cardiac follow-up in [place]. A referral has been sent. 3. Pain: Improved. Continue oxycodone 10 mg every 6 hours as needed. 4. Constipation: Patient has been instructed to be more vigilant about a daily regimen using Colace twice a day and possibly even MiraLAX. He was also previously given a prescription for lactulose. 5. Nausea: Continue Zofran as prescribed. 6. Neutropenia: Secondary to chemotherapy. Neulasta as above.  Approximately 30 minutes was spent in discussion of which greater than 50% was consultation.  Patient expressed understanding and was in agreement with this plan. He also  understands that He can call clinic at any time with any questions, concerns, or complaints.   Cancer Staging DLBCL (diffuse large B cell lymphoma) (HCC) Staging form: Hodgkin and Non-Hodgkin Lymphoma, AJCC 8th Edition - Clinical stage from 03/15/2017: Stage III (Diffuse large B-cell lymphoma) - Signed by Lloyd Huger, MD on 03/15/2017  Diffuse large B-cell lymphoma of lymph nodes of multiple regions [facility]) Staging form: Hodgkin and Non-Hodgkin Lymphoma, AJCC 8th Edition - Clinical stage from 03/17/2017: Stage III (Diffuse large B-cell lymphoma) - Signed by Lloyd Huger, MD on 03/17/2017   Lloyd Huger, MD   03/26/2017 10:26 AM

## 2017-03-25 ENCOUNTER — Encounter: Payer: Self-pay | Admitting: Oncology

## 2017-03-25 ENCOUNTER — Inpatient Hospital Stay: Payer: 59 | Attending: Oncology

## 2017-03-25 ENCOUNTER — Inpatient Hospital Stay (HOSPITAL_BASED_OUTPATIENT_CLINIC_OR_DEPARTMENT_OTHER): Payer: 59 | Admitting: Oncology

## 2017-03-25 VITALS — BP 80/60 | HR 88 | Temp 97.6°F | Ht 77.0 in | Wt 269.4 lb

## 2017-03-25 DIAGNOSIS — Z7689 Persons encountering health services in other specified circumstances: Secondary | ICD-10-CM | POA: Insufficient documentation

## 2017-03-25 DIAGNOSIS — K5900 Constipation, unspecified: Secondary | ICD-10-CM

## 2017-03-25 DIAGNOSIS — T451X5S Adverse effect of antineoplastic and immunosuppressive drugs, sequela: Secondary | ICD-10-CM | POA: Diagnosis not present

## 2017-03-25 DIAGNOSIS — D709 Neutropenia, unspecified: Secondary | ICD-10-CM | POA: Diagnosis not present

## 2017-03-25 DIAGNOSIS — C8338 Diffuse large B-cell lymphoma, lymph nodes of multiple sites: Secondary | ICD-10-CM

## 2017-03-25 DIAGNOSIS — T402X5A Adverse effect of other opioids, initial encounter: Secondary | ICD-10-CM | POA: Diagnosis not present

## 2017-03-25 DIAGNOSIS — I480 Paroxysmal atrial fibrillation: Secondary | ICD-10-CM | POA: Diagnosis not present

## 2017-03-25 DIAGNOSIS — Z5111 Encounter for antineoplastic chemotherapy: Secondary | ICD-10-CM | POA: Diagnosis present

## 2017-03-25 DIAGNOSIS — Z79899 Other long term (current) drug therapy: Secondary | ICD-10-CM | POA: Insufficient documentation

## 2017-03-25 DIAGNOSIS — Z7901 Long term (current) use of anticoagulants: Secondary | ICD-10-CM | POA: Insufficient documentation

## 2017-03-25 DIAGNOSIS — G4733 Obstructive sleep apnea (adult) (pediatric): Secondary | ICD-10-CM | POA: Diagnosis not present

## 2017-03-25 DIAGNOSIS — K5903 Drug induced constipation: Secondary | ICD-10-CM | POA: Diagnosis not present

## 2017-03-25 DIAGNOSIS — K219 Gastro-esophageal reflux disease without esophagitis: Secondary | ICD-10-CM | POA: Insufficient documentation

## 2017-03-25 DIAGNOSIS — E860 Dehydration: Secondary | ICD-10-CM | POA: Diagnosis not present

## 2017-03-25 DIAGNOSIS — R110 Nausea: Secondary | ICD-10-CM

## 2017-03-25 DIAGNOSIS — I2510 Atherosclerotic heart disease of native coronary artery without angina pectoris: Secondary | ICD-10-CM | POA: Insufficient documentation

## 2017-03-25 DIAGNOSIS — Z87891 Personal history of nicotine dependence: Secondary | ICD-10-CM | POA: Insufficient documentation

## 2017-03-25 DIAGNOSIS — Z9884 Bariatric surgery status: Secondary | ICD-10-CM | POA: Diagnosis not present

## 2017-03-25 LAB — CBC WITH DIFFERENTIAL/PLATELET
Basophils Absolute: 0 10*3/uL (ref 0–0.1)
Basophils Relative: 3 %
EOS ABS: 0.1 10*3/uL (ref 0–0.7)
Eosinophils Relative: 17 %
HEMATOCRIT: 40.2 % (ref 40.0–52.0)
HEMOGLOBIN: 13.7 g/dL (ref 13.0–18.0)
LYMPHS ABS: 0.3 10*3/uL — AB (ref 1.0–3.6)
LYMPHS PCT: 47 %
MCH: 28.9 pg (ref 26.0–34.0)
MCHC: 34.1 g/dL (ref 32.0–36.0)
MCV: 84.8 fL (ref 80.0–100.0)
Monocytes Absolute: 0.1 10*3/uL — ABNORMAL LOW (ref 0.2–1.0)
Monocytes Relative: 9 %
NEUTROS ABS: 0.2 10*3/uL — AB (ref 1.4–6.5)
NEUTROS PCT: 24 %
Platelets: 136 10*3/uL — ABNORMAL LOW (ref 150–440)
RBC: 4.74 MIL/uL (ref 4.40–5.90)
RDW: 13.6 % (ref 11.5–14.5)
WBC: 0.6 10*3/uL — AB (ref 3.8–10.6)

## 2017-03-25 LAB — COMPREHENSIVE METABOLIC PANEL
ALK PHOS: 122 U/L (ref 38–126)
ALT: 38 U/L (ref 17–63)
AST: 19 U/L (ref 15–41)
Albumin: 3.5 g/dL (ref 3.5–5.0)
Anion gap: 6 (ref 5–15)
BUN: 21 mg/dL — ABNORMAL HIGH (ref 6–20)
CALCIUM: 9 mg/dL (ref 8.9–10.3)
CO2: 25 mmol/L (ref 22–32)
CREATININE: 0.77 mg/dL (ref 0.61–1.24)
Chloride: 102 mmol/L (ref 101–111)
GFR calc non Af Amer: >60 mL/min (ref >60)
GLUCOSE: 115 mg/dL — AB (ref 65–99)
Potassium: 4.2 mmol/L (ref 3.5–5.1)
SODIUM: 133 mmol/L — AB (ref 135–145)
Total Bilirubin: 1.4 mg/dL — ABNORMAL HIGH (ref 0.3–1.2)
Total Protein: 6.8 g/dL (ref 6.5–8.1)

## 2017-03-25 NOTE — Progress Notes (Signed)
Patient here for follow up. Discussed medications and tiredness. BP low today patine instructed to drink fluids.

## 2017-03-26 LAB — CHROMOSOME ANALYSIS, BONE MARROW

## 2017-03-27 ENCOUNTER — Other Ambulatory Visit: Payer: Self-pay | Admitting: Internal Medicine

## 2017-03-29 ENCOUNTER — Encounter: Payer: Self-pay | Admitting: Pathology

## 2017-03-29 ENCOUNTER — Other Ambulatory Visit: Payer: Self-pay | Admitting: *Deleted

## 2017-03-29 DIAGNOSIS — R1900 Intra-abdominal and pelvic swelling, mass and lump, unspecified site: Secondary | ICD-10-CM

## 2017-03-29 DIAGNOSIS — C8338 Diffuse large B-cell lymphoma, lymph nodes of multiple sites: Secondary | ICD-10-CM

## 2017-03-29 MED ORDER — ONDANSETRON HCL 8 MG PO TABS: 8.0000 mg | ORAL_TABLET | Freq: Two times a day (BID) | ORAL | 2 refills | Status: DC | PRN

## 2017-03-29 MED ORDER — OMEPRAZOLE 40 MG PO CPDR: 40.0000 mg | DELAYED_RELEASE_CAPSULE | Freq: Two times a day (BID) | ORAL | 1 refills | Status: DC

## 2017-03-29 MED ORDER — PREDNISONE 20 MG PO TABS: ORAL_TABLET | ORAL | 2 refills | Status: DC

## 2017-03-29 NOTE — Telephone Encounter (Signed)
Asking if he can get a 1 month supply of his prednisone for insurance reasons. She fills his med box and  Will only give him 5 days of 100 mg daily.

## 2017-03-31 NOTE — Progress Notes (Signed)
[place]  [phone] [phone]  ID: Nicholas Reynolds OB: 1951-01-29  MR#: [id number]  GBT#:[id number]  Patient Care Team: Valera Castle, MD as PCP - General  CHIEF COMPLAINT: Stage III DLBCL.  INTERVAL HISTORY: Patient returns to clinic today for further evaluation and consideration of cycle 2 of R-CHOP chemotherapy. He continues to have persistent nausea and fatigue, but otherwise feels well and nearly back to his baseline. His appetite has improved. He does not complain of pain today. He continues to have problems with constipation. He denies any fevers or illnesses. He has no neurologic complaints. He denies any shortness of breath, cough, or hemoptysis. He has no urinary complaints. Patient offers no further specific complaints.  REVIEW OF SYSTEMS:   Review of Systems  Constitutional: Positive for malaise/fatigue. Negative for fever and weight loss.  Respiratory: Negative for cough, hemoptysis and shortness of breath.   Cardiovascular: Negative for chest pain and leg swelling.  Gastrointestinal: Positive for constipation and nausea. Negative for abdominal pain, blood in stool, diarrhea, melena and vomiting.  Genitourinary: Negative.   Musculoskeletal: Positive for back pain.  Neurological: Positive for weakness. Negative for sensory change.  Psychiatric/Behavioral: The patient is nervous/anxious and has insomnia.     As per HPI. Otherwise, a complete review of systems is negative.  PAST MEDICAL HISTORY: Past Medical History:  Diagnosis Date  . Arthritis    thumbs  . Asymptomatic varicose veins of bilateral lower extremities    Uncomfortable but in no ulcers  . Cancer ([place])   . Complication of anesthesia    slow to wake after hernia surg.  was taking "a bunch" of herbals at that time  . Coronary artery disease, non-occlusive July 2008   Nonobstructive by cath   . Family history of premature coronary artery disease    Father  . GERD (gastroesophageal reflux disease)   . 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: Status post ablation - RVOT VT  . 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 Obstructive sleep apnea    severe per study 09/05/14   . Paroxysmal atrial fibrillation ([place]) 2002   a) Initially diagnosed as [place] Fib while in [place] (on ASA & BB after) - was on warfarin for a couple years and a Flecainide Pill-in Pocket PRN; b) recurrent A. fib with RVR August 2050 - status post cardioversion with flecainide, on low-dose beta blocker  . Pre-syncope May 2015   Unrevealing monitor in May  . Uses hearing aid    bilateral  . Wears dentures    partial upper    PAST SURGICAL HISTORY: Past Surgical History:  Procedure Laterality Date  . CARDIAC CATHETERIZATION  2008   Nonobstructive CAD  . CHOLECYSTECTOMY    . ELBOW SURGERY    . Event monitor  May 2015   Unrevealing  . HERNIA REPAIR    . KNEE ARTHROSCOPY Left 06/28/2015   Procedure: ARTHROSCOPY KNEE WITH SYNOVECTOMY;  Surgeon: Leanor Kail, MD;  Location: [place];  Service: Orthopedics;  Laterality: Left;  CPAP  . [place] RESECTION     December 20 14th  . NM MYOVIEW LTD  June 28, 2014   [facility]: [facility] -- no evidence of ischemia or infarction. Normal EF ~58%  . PORTA CATH INSERTION N/A 03/15/2017  Procedure: Porta Cath Insertion;  Surgeon: Algernon Huxley, MD;  Location: [facility] [facility];  Service: Cardiovascular;  Laterality: N/A;  . RADIOFREQUENCY ABLATION of Ventricular Tachycardia  July 2000 the   RVOT VT ablation: [facility] [place]    . TRANSTHORACIC ECHOCARDIOGRAM  May 2015   [facility] - Dr. Sharyn Lull: EF 55%, mild LVH;  otherwise mostly normal    FAMILY HISTORY: Family History  Problem Relation Age of Onset  . Breast cancer Mother   . Bone cancer Mother   . COPD Father   . Arrhythmia Father   . Heart failure Father   . Heart attack Father     ADVANCED DIRECTIVES (Y/N):  N  HEALTH MAINTENANCE: Social History  Substance Use Topics  . Smoking status: Former Smoker    Quit date: 11/24/1999  . Smokeless tobacco: Never Used  . Alcohol use No     Colonoscopy:  PAP:  Bone density:  Lipid panel:  No Known Allergies  Current Outpatient Prescriptions  Medication Sig Dispense Refill  . allopurinol (ZYLOPRIM) 300 MG tablet Take 1 tablet (300 mg total) by mouth daily. 30 tablet 3  . lidocaine-prilocaine (EMLA) cream Apply to affected area once 30 g 3  . metoprolol succinate (TOPROL-XL) 50 MG 24 hr tablet Take 50 mg by mouth 2 (two) times daily. Take with or immediately following a meal.    . Multiple Vitamin (MULTIVITAMIN) tablet Take 1 tablet by mouth daily.    Marland Kitchen omeprazole (PRILOSEC) 40 MG capsule Take 1 capsule (40 mg total) by mouth 2 (two) times daily. 180 capsule 1  . ondansetron (ZOFRAN) 8 MG tablet Take 1 tablet (8 mg total) by mouth 2 (two) times daily as needed for refractory nausea / vomiting. Start on day 3 after cyclophosphamide chemotherapy. 60 tablet 2  . Oxycodone HCl 10 MG TABS Take 1 tablet (10 mg total) by mouth every 6 (six) hours as needed. 90 tablet 0  . predniSONE (DELTASONE) 20 MG tablet Take 5 tablets (100 mg) daily on days 1 - 5 of chemo 50 tablet 2  . prochlorperazine (COMPAZINE) 10 MG tablet Take 1 tablet (10 mg total) by mouth every 6 (six) hours as needed (Nausea or vomiting). 60 tablet 6  . tamsulosin (FLOMAX) 0.4 MG CAPS capsule Take 0.4 mg by mouth daily. PM    . warfarin (COUMADIN) 5 MG tablet Take 5 mg by mouth daily. Mon and Fri 7.5 mg, all other days 5 mg     No current facility-administered medications for this visit.    Facility-Administered Medications Ordered  in Other Visits  Medication Dose Route Frequency Provider Last Rate Last Dose  . famotidine (PEPCID) IVPB 20 mg premix  20 mg Intravenous Q12H Lloyd Huger, MD   20 mg at 04/01/17 1019  . heparin lock flush 100 unit/mL  500 Units Intracatheter Once PRN Lloyd Huger, MD      . pegfilgrastim (NEULASTA ONPRO KIT) injection 6 mg  6 mg Subcutaneous Once Lloyd Huger, MD        OBJECTIVE: Vitals:   04/01/17 0857  BP: 103/66  Pulse: 76  Resp: 20  Temp: (!) 96.4 F (35.8 C)     There is no height or weight on file to calculate BMI.    ECOG FS:1 - Symptomatic but completely ambulatory  General: Well-developed, well-nourished, no acute distress. Eyes: Pink conjunctiva, anicteric sclera. Lungs: Clear to auscultation bilaterally. Heart: Regular rate  and rhythm. No rubs, murmurs, or gallops. Abdomen: Soft, nontender, nondistended. No organomegaly noted, normoactive bowel sounds. Musculoskeletal: No edema, cyanosis, or clubbing. Neuro: Alert, answering all questions appropriately. Cranial nerves grossly intact. Skin: No rashes or petechiae noted. Psych: Normal affect. Lymphatics: No cervical, calvicular, axillary or inguinal LAD.   LAB RESULTS:  Lab Results  Component Value Date   NA 136 04/01/2017   K 4.2 04/01/2017   CL 104 04/01/2017   CO2 29 04/01/2017   GLUCOSE 138 (H) 04/01/2017   BUN 14 04/01/2017   CREATININE 1.01 04/01/2017   CALCIUM 9.3 04/01/2017   PROT 6.8 04/01/2017   ALBUMIN 3.5 04/01/2017   AST 21 04/01/2017   ALT 22 04/01/2017   ALKPHOS 102 04/01/2017   BILITOT 0.4 04/01/2017   GFRNONAA >60 04/01/2017   GFRAA >60 04/01/2017    Lab Results  Component Value Date   WBC 8.9 04/01/2017   NEUTROABS 8.1 (H) 04/01/2017   HGB 13.2 04/01/2017   HCT 38.2 (L) 04/01/2017   MCV 84.3 04/01/2017   PLT 260 04/01/2017     STUDIES: Dg Chest 2 View  Result Date: 03/04/2017 CLINICAL DATA:  Tachycardia.  History of atrial fibrillation. EXAM: CHEST  2  VIEW COMPARISON:  Chest CT 02/19/2017 and chest x-ray 06/26/2014 FINDINGS: The cardiac silhouette, mediastinal and hilar contours are within normal limits and stable. There is mild tortuosity of the thoracic aorta. Streaky bibasilar atelectasis but no definite infiltrates or effusions. Right paraspinal lesions seen on recent CT scan are difficult to identify on the chest x-ray. IMPRESSION: Streaky bibasilar atelectasis but no definite infiltrates or effusions. Electronically Signed   By: Marijo Sanes M.D.   On: 03/04/2017 19:12   Nm Cardiac Muga Rest  Result Date: 03/16/2017 CLINICAL DATA:  Large cell lymphoma, high risk chemotherapy EXAM: NUCLEAR MEDICINE CARDIAC BLOOD POOL IMAGING (MUGA) TECHNIQUE: Cardiac multi-gated acquisition was performed at rest following intravenous injection of Tc-51mlabeled red blood cells. RADIOPHARMACEUTICALS:  21.63 mCi Tc-959mertechnetate in-vitro labeled red blood cells IV COMPARISON:  None. FINDINGS: LEFT ventricular ejection fraction is calculated at 66%, within the normal range. Study was obtained at a cardiac rate of 69 beats per minute. Patient was rhythmic during imaging. Wall motion analysis of the LEFT ventricle in 3 projections is normal IMPRESSION: Normal LEFT ventricular ejection fraction of 66%. Normal LV wall motion. Electronically Signed   By: MaLavonia Dana.D.   On: 03/16/2017 15:49   Ct Biopsy  Result Date: 03/17/2017 INDICATION: Diffuse large B-cell lymphoma EXAM: CT GUIDED RIGHT ILIAC BONE MARROW ASPIRATION AND CORE BIOPSY Date:  4/25/20184/25/2018 9:35 am Radiologist:  M. TrDaryll BrodMD Guidance:  CT FLUOROSCOPY TIME:  Fluoroscopy Time: NONE. MEDICATIONS: None. ANESTHESIA/SEDATION: 3.0 mg IV Versed; 100 mcg IV Fentanyl Moderate Sedation Time:  15 minutes The patient was continuously monitored during the procedure by the interventional radiology nurse under my direct supervision. CONTRAST:  None. COMPLICATIONS: None PROCEDURE: Informed consent was  obtained from the patient following explanation of the procedure, risks, benefits and alternatives. The patient understands, agrees and consents for the procedure. All questions were addressed. A time out was performed. The patient was positioned prone and non-contrast localization CT was performed of the pelvis to demonstrate the iliac marrow spaces. Maximal barrier sterile technique utilized including caps, mask, sterile gowns, sterile gloves, large sterile drape, hand hygiene, and Betadine prep. Under sterile conditions and local anesthesia, an 11 gauge coaxial bone biopsy needle was advanced into the right iliac marrow space. Needle position  was confirmed with CT imaging. Initially, bone marrow aspiration was performed. Next, the 11 gauge outer cannula was utilized to obtain a right iliac bone marrow core biopsy. Needle was removed. Hemostasis was obtained with compression. The patient tolerated the procedure well. Samples were prepared with the cytotechnologist. No immediate complications. IMPRESSION: CT guided right iliac bone marrow aspiration and core biopsy. Electronically Signed   By: Jerilynn Mages.  Shick M.D.   On: 03/17/2017 09:58   Ct Biopsy  Result Date: 03/02/2017 INDICATION: Thoracic right paraspinal mass EXAM: CT BIOPSY MEDICATIONS: None. ANESTHESIA/SEDATION: Fentanyl 100 mcg IV; Versed 2 mg IV Moderate Sedation Time:  12 The patient was continuously monitored during the procedure by the interventional radiology nurse under my direct supervision. FLUOROSCOPY TIME:  None. COMPLICATIONS: None immediate. PROCEDURE: Informed written consent was obtained from the patient after a thorough discussion of the procedural risks, benefits and alternatives. All questions were addressed. Maximal Sterile Barrier Technique was utilized including caps, mask, sterile gowns, sterile gloves, sterile drape, hand hygiene and skin antiseptic. A timeout was performed prior to the initiation of the procedure. Under CT guidance,  a(n) 17 gauge guide needle was advanced into the right thoracic paraspinal mass. Subsequently 6 18 gauge core biopsies were obtained. The guide needle was removed. Post biopsy images demonstrate no hemorrhage. Patient tolerated the procedure well without complication. Vital sign monitoring by nursing staff during the procedure will continue as patient is in the special procedures unit for post procedure observation. FINDINGS: The images document guide needle placement within the right thoracic paraspinal mass. Post biopsy images demonstrate no hemorrhage. IMPRESSION: Successful CT-guided right paraspinal thoracic mass core biopsy. Electronically Signed   By: Marybelle Killings M.D.   On: 03/02/2017 15:49    ASSESSMENT: Stage III DLBCL.  PLAN:    1. DLBCL: PET scan results reviewed independently and reported as above. Bone marrow biopsy was negative for lymphoma. Pretreatment MUGA scan on March 16, 2017 reported an EF of 66% repeat in July 2018. Proceed with cycle 2 of dose dense R-CHOP today. We also discussed the possibility of switching treatment every 3 weeks. Patient does not wish to do this at this time and wishes to continue in a dose dense fashion. He also received OnPro Neulasta support. Return to clinic in 2 weeks for consideration of cycle 3.  2. Atrial fibrillation: Patient underwent RFA on December 21, 2016. He also had a negative nuclear medicine cardiac stress test on January 13, 2017. 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 Patient will require continued cardiac follow-up in [place]. A referral has been sent. 3. Pain: Improved. Continue oxycodone 10 mg every 6 hours as needed. 4. Constipation: Patient has been instructed to be more vigilant about a daily regimen using Colace twice a day and possibly even MiraLAX. He was also previously given a prescription for lactulose. 5. Nausea: Continue Zofran as prescribed. 6. Neutropenia: Continue Neulasta as above.  Patient  expressed understanding and was in agreement with this plan. He also understands that He can call clinic at any time with any questions, concerns, or complaints.   Cancer Staging DLBCL (diffuse large B cell lymphoma) (HCC) Staging form: Hodgkin and Non-Hodgkin Lymphoma, AJCC 8th Edition - Clinical stage from 03/15/2017: Stage III (Diffuse large B-cell lymphoma) - Signed by Lloyd Huger, MD on 03/15/2017  Diffuse large B-cell lymphoma of lymph nodes of multiple regions [facility]) Staging form: Hodgkin and Non-Hodgkin Lymphoma, AJCC 8th Edition - Clinical stage from 03/17/2017: Stage III (Diffuse  large B-cell lymphoma) - Signed by Lloyd Huger, MD on 03/17/2017   Lloyd Huger, MD   04/01/2017 2:32 PM

## 2017-04-01 ENCOUNTER — Inpatient Hospital Stay: Payer: 59

## 2017-04-01 ENCOUNTER — Inpatient Hospital Stay (HOSPITAL_BASED_OUTPATIENT_CLINIC_OR_DEPARTMENT_OTHER): Payer: 59 | Admitting: Oncology

## 2017-04-01 VITALS — BP 103/66 | HR 76 | Temp 96.4°F | Resp 20

## 2017-04-01 DIAGNOSIS — R110 Nausea: Secondary | ICD-10-CM

## 2017-04-01 DIAGNOSIS — K5900 Constipation, unspecified: Secondary | ICD-10-CM

## 2017-04-01 DIAGNOSIS — Z5111 Encounter for antineoplastic chemotherapy: Secondary | ICD-10-CM | POA: Diagnosis not present

## 2017-04-01 DIAGNOSIS — I480 Paroxysmal atrial fibrillation: Secondary | ICD-10-CM | POA: Diagnosis not present

## 2017-04-01 DIAGNOSIS — Z79899 Other long term (current) drug therapy: Secondary | ICD-10-CM | POA: Diagnosis not present

## 2017-04-01 DIAGNOSIS — C8338 Diffuse large B-cell lymphoma, lymph nodes of multiple sites: Secondary | ICD-10-CM

## 2017-04-01 DIAGNOSIS — Z7901 Long term (current) use of anticoagulants: Secondary | ICD-10-CM | POA: Diagnosis not present

## 2017-04-01 DIAGNOSIS — I4891 Unspecified atrial fibrillation: Secondary | ICD-10-CM

## 2017-04-01 LAB — COMPREHENSIVE METABOLIC PANEL
ALK PHOS: 102 U/L (ref 38–126)
ALT: 22 U/L (ref 17–63)
ANION GAP: 3 — AB (ref 5–15)
AST: 21 U/L (ref 15–41)
Albumin: 3.5 g/dL (ref 3.5–5.0)
BUN: 14 mg/dL (ref 6–20)
CALCIUM: 9.3 mg/dL (ref 8.9–10.3)
CHLORIDE: 104 mmol/L (ref 101–111)
CO2: 29 mmol/L (ref 22–32)
CREATININE: 1.01 mg/dL (ref 0.61–1.24)
Glucose, Bld: 138 mg/dL — ABNORMAL HIGH (ref 65–99)
Potassium: 4.2 mmol/L (ref 3.5–5.1)
Sodium: 136 mmol/L (ref 135–145)
Total Bilirubin: 0.4 mg/dL (ref 0.3–1.2)
Total Protein: 6.8 g/dL (ref 6.5–8.1)

## 2017-04-01 LAB — CBC WITH DIFFERENTIAL/PLATELET
Basophils Absolute: 0.1 10*3/uL (ref 0–0.1)
Basophils Relative: 1 %
EOS ABS: 0 10*3/uL (ref 0–0.7)
EOS PCT: 0 %
HCT: 38.2 % — ABNORMAL LOW (ref 40.0–52.0)
Hemoglobin: 13.2 g/dL (ref 13.0–18.0)
LYMPHS ABS: 0.4 10*3/uL — AB (ref 1.0–3.6)
LYMPHS PCT: 5 %
MCH: 29.2 pg (ref 26.0–34.0)
MCHC: 34.6 g/dL (ref 32.0–36.0)
MCV: 84.3 fL (ref 80.0–100.0)
MONOS PCT: 3 %
Monocytes Absolute: 0.3 10*3/uL (ref 0.2–1.0)
Neutro Abs: 8.1 10*3/uL — ABNORMAL HIGH (ref 1.4–6.5)
Neutrophils Relative %: 91 %
PLATELETS: 260 10*3/uL (ref 150–440)
RBC: 4.53 MIL/uL (ref 4.40–5.90)
RDW: 13.2 % (ref 11.5–14.5)
WBC: 8.9 10*3/uL (ref 3.8–10.6)

## 2017-04-01 MED ORDER — SODIUM CHLORIDE 0.9 % IV SOLN
375.0000 mg/m2 | Freq: Once | INTRAVENOUS | Status: AC
  Administered 2017-04-01: 1000 mg via INTRAVENOUS
  Filled 2017-04-01: qty 100

## 2017-04-01 MED ORDER — SODIUM CHLORIDE 0.9 % IV SOLN
Freq: Once | INTRAVENOUS | Status: AC
  Administered 2017-04-01: 10:00:00 via INTRAVENOUS
  Filled 2017-04-01: qty 1000

## 2017-04-01 MED ORDER — HEPARIN SOD (PORK) LOCK FLUSH 100 UNIT/ML IV SOLN
500.0000 [IU] | Freq: Once | INTRAVENOUS | Status: AC | PRN
  Administered 2017-04-01: 500 [IU]
  Filled 2017-04-01: qty 5

## 2017-04-01 MED ORDER — PALONOSETRON HCL INJECTION 0.25 MG/5ML
0.2500 mg | Freq: Once | INTRAVENOUS | Status: AC
  Administered 2017-04-01: 0.25 mg via INTRAVENOUS
  Filled 2017-04-01: qty 5

## 2017-04-01 MED ORDER — ACETAMINOPHEN 325 MG PO TABS
650.0000 mg | ORAL_TABLET | Freq: Once | ORAL | Status: AC
  Administered 2017-04-01: 650 mg via ORAL
  Filled 2017-04-01: qty 2

## 2017-04-01 MED ORDER — DOXORUBICIN HCL CHEMO IV INJECTION 2 MG/ML
50.0000 mg/m2 | Freq: Once | INTRAVENOUS | Status: AC
  Administered 2017-04-01: 132 mg via INTRAVENOUS
  Filled 2017-04-01: qty 66

## 2017-04-01 MED ORDER — DEXAMETHASONE SODIUM PHOSPHATE 10 MG/ML IJ SOLN: 20.0000 mg | Freq: Once | INTRAMUSCULAR | Status: DC

## 2017-04-01 MED ORDER — VINCRISTINE SULFATE CHEMO INJECTION 1 MG/ML
2.0000 mg | Freq: Once | INTRAVENOUS | Status: AC
  Administered 2017-04-01: 2 mg via INTRAVENOUS
  Filled 2017-04-01: qty 2

## 2017-04-01 MED ORDER — FAMOTIDINE IN NACL 20-0.9 MG/50ML-% IV SOLN
20.0000 mg | Freq: Two times a day (BID) | INTRAVENOUS | Status: DC
  Administered 2017-04-01: 20 mg via INTRAVENOUS
  Filled 2017-04-01: qty 50

## 2017-04-01 MED ORDER — SODIUM CHLORIDE 0.9 % IV SOLN
20.0000 mg | Freq: Once | INTRAVENOUS | Status: AC
  Administered 2017-04-01: 20 mg via INTRAVENOUS
  Filled 2017-04-01: qty 2

## 2017-04-01 MED ORDER — DIPHENHYDRAMINE HCL 25 MG PO CAPS
50.0000 mg | ORAL_CAPSULE | Freq: Once | ORAL | Status: AC
  Administered 2017-04-01: 50 mg via ORAL
  Filled 2017-04-01: qty 2

## 2017-04-01 MED ORDER — PEGFILGRASTIM 6 MG/0.6ML ~~LOC~~ PSKT
6.0000 mg | PREFILLED_SYRINGE | Freq: Once | SUBCUTANEOUS | Status: AC
  Administered 2017-04-01: 6 mg via SUBCUTANEOUS
  Filled 2017-04-01: qty 0.6

## 2017-04-01 MED ORDER — SODIUM CHLORIDE 0.9 % IV SOLN
2000.0000 mg | Freq: Once | INTRAVENOUS | Status: AC
  Administered 2017-04-01: 2000 mg via INTRAVENOUS
  Filled 2017-04-01: qty 100

## 2017-04-01 MED ORDER — SODIUM CHLORIDE 0.9 % IV SOLN: 375.0000 mg/m2 | Freq: Once | INTRAVENOUS | Status: DC

## 2017-04-01 NOTE — Progress Notes (Signed)
Patient reports feeling fatiqued, constipation remains an issue using enemas twice weekly to go, lactulose and miraxlax not working.  Patient unable to sleep needs sleep aide.

## 2017-04-01 NOTE — Addendum Note (Signed)
Addended by: Luretha Murphy on: 04/01/2017 02:42 PM   Modules accepted: Orders

## 2017-04-01 NOTE — Progress Notes (Signed)
Nutrition Assessment   Reason for Assessment:   Patient identified on Malnutrition Screening Report for poor appetite and weight loss  ASSESSMENT:  66 year old male with lymphoma currently receiving chemotherapy.   Patient seen in infusion suite with wife at chairside.  Patient is followed by Dr. Grayland Ormond.  Past medical history of CAD, GERD, gastric sleeve 2014 at [facility] per pt, afib  Patient reports decreased appetite for the last 2 1/2 months primarily due to nausea.  Does report that he takes nausea medication and it helps.  Patient also has constipation.  Report this am ate 1/2 bagel thin with fruit and boost drink, may have a BLT for lunch or [place] sandwich and then evening meal a sandwich.  Patient also reports some dry mouth but has improved.    Nutrition Focused Physical Exam: deferred  Medications: MVI, zofran, compazine, prilosec  Labs: reviewed, reports not taking bariatric supplements  Anthropometrics:   Height: 77 inches Weight: 269 lb 6.4 oz UBW: 300 lb per patient Noted last at that weight on 05/2016 BMI: 31  10% weight loss in 9 months 5% weight loss in the last month (284 lb 4/3-269 lb 5/3)  Estimated Energy Needs  Kcals: 2300-2600 calories/d Protein: 142-190 g/d Fluid: 2.6 L/d  NUTRITION DIAGNOSIS: Inadequate oral intake related to cancer and cancer related treatments as evidenced by 5% weight loss in 1 month, eating less than 50% estimated energy needs for > or equal to 5 days.   MALNUTRITION DIAGNOSIS: Patient meets criteria for moderate malnutrition in context of acute illness as evidenced by 5% weight loss in 1 month and eating less than 50% of estimated energy needs for > or equal to 5 days.    INTERVENTION:  Discussed nutrition strategies to help relieve constipation and fact sheet given.   Discussed nutrition strategies for dry mouth and fact sheet given. Discussed small frequent meals and importance of adequate protein.  Provided examples of  protein foods for patient and wife.   Encouraged intake of premier protein (lower in carbohydrate) with gastric sleeve surgery.  Encouraged use of nausea medication and medications to relieve constipation.    MONITORING, EVALUATION, GOAL: Patient will consume adequate calories and protein to maintain lean muscle mass   NEXT VISIT: May 24 during infusion  Kaelie Henigan B. Zenia Resides, [place], [place] Registered Dietitian [phone] (pager)

## 2017-04-02 ENCOUNTER — Inpatient Hospital Stay: Payer: 59

## 2017-04-02 ENCOUNTER — Telehealth: Payer: Self-pay

## 2017-04-02 VITALS — BP 102/64 | HR 65 | Temp 96.1°F | Resp 20

## 2017-04-02 DIAGNOSIS — E860 Dehydration: Secondary | ICD-10-CM

## 2017-04-02 DIAGNOSIS — C8338 Diffuse large B-cell lymphoma, lymph nodes of multiple sites: Secondary | ICD-10-CM

## 2017-04-02 DIAGNOSIS — Z5111 Encounter for antineoplastic chemotherapy: Secondary | ICD-10-CM | POA: Diagnosis not present

## 2017-04-02 MED ORDER — SODIUM CHLORIDE 0.9% FLUSH
10.0000 mL | INTRAVENOUS | Status: DC | PRN
  Administered 2017-04-02: 10 mL via INTRAVENOUS
  Filled 2017-04-02: qty 10

## 2017-04-02 MED ORDER — HEPARIN SOD (PORK) LOCK FLUSH 100 UNIT/ML IV SOLN
500.0000 [IU] | Freq: Once | INTRAVENOUS | Status: AC
  Administered 2017-04-02: 500 [IU] via INTRAVENOUS

## 2017-04-02 MED ORDER — SODIUM CHLORIDE 0.9 % IV SOLN: Freq: Once | INTRAVENOUS | Status: DC

## 2017-04-02 MED ORDER — ONDANSETRON HCL 40 MG/20ML IJ SOLN
Freq: Once | INTRAMUSCULAR | Status: AC
  Administered 2017-04-02: 11:00:00 via INTRAVENOUS
  Filled 2017-04-02: qty 4

## 2017-04-02 MED ORDER — ONDANSETRON 8 MG PO TBDP: 8.0000 mg | ORAL_TABLET | Freq: Once | ORAL | Status: DC

## 2017-04-02 MED ORDER — SODIUM CHLORIDE 0.9 % IV SOLN
Freq: Once | INTRAVENOUS | Status: AC
  Administered 2017-04-02: 11:00:00 via INTRAVENOUS
  Filled 2017-04-02: qty 1000

## 2017-04-02 NOTE — Telephone Encounter (Signed)
Patient is going to be coming in to [facility] for IVF and I will check vitals and ortho stats

## 2017-04-02 NOTE — Telephone Encounter (Signed)
Called wife, in the middle of the night he laid on the couch and went to get up and felt like he was going to pass out, his blood pressure was 96/66 pulse 88  94/50 pulse 104 abnormal rhythm   102/57 pulse 87.    Trouble walking and standing.

## 2017-04-02 NOTE — Telephone Encounter (Signed)
-----   Message from [place] sent at 04/02/2017  9:20 AM EDT ----- Pt's wife called and left message this morning that patient was having side effects from his chemo from yesterday and would like to speak with a nurse. Please return call to [phone]. Thanks!

## 2017-04-05 ENCOUNTER — Telehealth: Payer: Self-pay

## 2017-04-05 NOTE — Telephone Encounter (Signed)
-----   Message from [place] sent at 04/02/2017  9:20 AM EDT ----- Pt's wife called and left message this morning that patient was having side effects from his chemo from yesterday and would like to speak with a nurse. Please return call to [phone]. Thanks!

## 2017-04-05 NOTE — Telephone Encounter (Signed)
This has been taken care of patient came in for IVF.

## 2017-04-06 ENCOUNTER — Encounter (HOSPITAL_COMMUNITY): Payer: Self-pay

## 2017-04-08 ENCOUNTER — Emergency Department
Admission: EM | Admit: 2017-04-08 | Discharge: 2017-04-08 | Disposition: A | Discharge status: Home / Self Care | Payer: 59 | Attending: Emergency Medicine | Admitting: Emergency Medicine

## 2017-04-08 ENCOUNTER — Encounter: Payer: Self-pay | Admitting: Emergency Medicine

## 2017-04-08 ENCOUNTER — Telehealth: Payer: Self-pay | Admitting: *Deleted

## 2017-04-08 DIAGNOSIS — I2510 Atherosclerotic heart disease of native coronary artery without angina pectoris: Secondary | ICD-10-CM | POA: Insufficient documentation

## 2017-04-08 DIAGNOSIS — Z87891 Personal history of nicotine dependence: Secondary | ICD-10-CM | POA: Insufficient documentation

## 2017-04-08 DIAGNOSIS — E860 Dehydration: Secondary | ICD-10-CM | POA: Insufficient documentation

## 2017-04-08 DIAGNOSIS — R197 Diarrhea, unspecified: Secondary | ICD-10-CM | POA: Insufficient documentation

## 2017-04-08 DIAGNOSIS — Z9221 Personal history of antineoplastic chemotherapy: Secondary | ICD-10-CM | POA: Diagnosis not present

## 2017-04-08 DIAGNOSIS — C801 Malignant (primary) neoplasm, unspecified: Secondary | ICD-10-CM | POA: Diagnosis not present

## 2017-04-08 DIAGNOSIS — R110 Nausea: Secondary | ICD-10-CM | POA: Insufficient documentation

## 2017-04-08 LAB — URINALYSIS, COMPLETE (UACMP) WITH MICROSCOPIC
BACTERIA UA: NONE SEEN
Bilirubin Urine: NEGATIVE
Glucose, UA: NEGATIVE mg/dL
HGB URINE DIPSTICK: NEGATIVE
KETONES UR: NEGATIVE mg/dL
LEUKOCYTES UA: NEGATIVE
Nitrite: NEGATIVE
PROTEIN: 30 mg/dL — AB
SQUAMOUS EPITHELIAL / LPF: NONE SEEN
Specific Gravity, Urine: 1.019 (ref 1.005–1.030)
pH: 7 (ref 5.0–8.0)

## 2017-04-08 LAB — COMPREHENSIVE METABOLIC PANEL
ALT: 46 U/L (ref 17–63)
AST: 23 U/L (ref 15–41)
Albumin: 3.7 g/dL (ref 3.5–5.0)
Alkaline Phosphatase: 128 U/L — ABNORMAL HIGH (ref 38–126)
Anion gap: 6 (ref 5–15)
BUN: 23 mg/dL — AB (ref 6–20)
CHLORIDE: 101 mmol/L (ref 101–111)
CO2: 28 mmol/L (ref 22–32)
Calcium: 9.3 mg/dL (ref 8.9–10.3)
Creatinine, Ser: 1.08 mg/dL (ref 0.61–1.24)
GFR calc Af Amer: >60 mL/min (ref >60)
Glucose, Bld: 119 mg/dL — ABNORMAL HIGH (ref 65–99)
POTASSIUM: 5.3 mmol/L — AB (ref 3.5–5.1)
SODIUM: 135 mmol/L (ref 135–145)
Total Bilirubin: 1.1 mg/dL (ref 0.3–1.2)
Total Protein: 6.8 g/dL (ref 6.5–8.1)

## 2017-04-08 LAB — CBC
HEMATOCRIT: 39.3 % — AB (ref 40.0–52.0)
Hemoglobin: 13.3 g/dL (ref 13.0–18.0)
MCH: 28.6 pg (ref 26.0–34.0)
MCHC: 33.9 g/dL (ref 32.0–36.0)
MCV: 84.5 fL (ref 80.0–100.0)
Platelets: 216 10*3/uL (ref 150–440)
RBC: 4.65 MIL/uL (ref 4.40–5.90)
RDW: 13.9 % (ref 11.5–14.5)
WBC: 1 10*3/uL — AB (ref 3.8–10.6)

## 2017-04-08 LAB — LIPASE, BLOOD: LIPASE: 44 U/L (ref 11–51)

## 2017-04-08 MED ORDER — SODIUM CHLORIDE 0.9 % IV SOLN
Freq: Once | INTRAVENOUS | Status: AC
  Administered 2017-04-08: 20:00:00 via INTRAVENOUS

## 2017-04-08 MED ORDER — ONDANSETRON HCL 4 MG/2ML IJ SOLN
4.0000 mg | Freq: Once | INTRAMUSCULAR | Status: AC
  Administered 2017-04-08: 4 mg via INTRAVENOUS
  Filled 2017-04-08: qty 2

## 2017-04-08 NOTE — ED Notes (Signed)
Reviewed d/c instructions, follow-up care, need to increase fluids, safe ambulation with patient. PT verbalized understanding.

## 2017-04-08 NOTE — ED Provider Notes (Signed)
[facility] Emergency Department Provider Note       Time seen: ----------------------------------------- 7:28 PM on 04/08/2017 -----------------------------------------     I have reviewed the triage vital signs and the nursing notes.   HISTORY   Chief Complaint Diarrhea and Nausea    HPI Nicholas Reynolds is a 66 y.o. male who presents to the ED for diarrhea and nausea today. Patient reports last chemotherapy treatment was last week and he is currently under the care of Dr. Grayland Ormond. Patient reports he had low white blood cell count approximate 1 week after his last treatment. He denies fevers, chills, chest pain, shortness of breath, abdominal pain or other complaints.   Past Medical History:  Diagnosis Date  . Arthritis    thumbs  . Asymptomatic varicose veins of bilateral lower extremities    Uncomfortable but in no ulcers  . Cancer ([place])   . Complication of anesthesia    slow to wake after hernia surg.  was taking "a bunch" of herbals at that time  . Coronary artery disease, non-occlusive July 2008   Nonobstructive by cath   . Family history of premature coronary artery disease    Father  . GERD (gastroesophageal reflux disease)   . 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 Obstructive sleep apnea    severe per study 09/05/14   . Paroxysmal atrial fibrillation ([place]) 2002   a) Initially diagnosed as Lone A Fib while in [place] (on ASA & BB after) - was on warfarin for a couple years and a Flecainide Pill-in Pocket PRN; b) recurrent A. fib with RVR August 2050 - status post cardioversion with flecainide, on low-dose beta blocker  .  Pre-syncope May 2015   Unrevealing monitor in May  . Uses hearing aid    bilateral  . Wears dentures    partial upper    Patient Active Problem List   Diagnosis Date Noted  . Goals of care, counseling/discussion 03/15/2017  . 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[place]) 03/15/2017  . Chronic back pain 03/09/2017  . Gastroesophageal reflux 03/09/2017  . Morbid obesity ([place]) 03/09/2017  . Peripheral vascular disease ([place]) 03/09/2017  . 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 Ventricular tachycardia ([place]) 03/09/2017  . DLBCL (diffuse large B cell lymphoma) ([place]) 03/08/2017  . 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 Sleep apnea   . Family history of premature coronary artery disease   . Pigmented villonodular synovitis of left knee 07/09/2015  . Strain of left quadriceps tendon 04/18/2015  . Traumatic arthritis of knee, left 04/18/2015  . Acute pain of left knee 03/07/2015  . Obesity (BMI 30-39.9) 12/05/2014  . Orthostatic dizziness 12/05/2014  . Trigger finger of left thumb 09/06/2014  . Atrial fibrillation ([place]) 07/15/2014  . Varicose veins of lower extremity with edema 07/15/2014  . Fatigue 07/15/2014  . Hearing loss, sensorineural 04/26/2014  . Palpitations 04/06/2014  . 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 BPH (benign prostatic hyperplasia) 03/10/2013  . Venous insufficiency 11/17/2012  . 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 H/O ventricular tachycardia 05/24/2007  . Coronary artery disease, non-occlusive 05/24/2007    Past Surgical History:  Procedure Laterality Date  . CARDIAC CATHETERIZATION  2008  Nonobstructive CAD  . CHOLECYSTECTOMY    . ELBOW SURGERY    . Event monitor  May 2015   Unrevealing  . HERNIA REPAIR    . KNEE ARTHROSCOPY Left 06/28/2015   Procedure: ARTHROSCOPY KNEE WITH SYNOVECTOMY;  Surgeon: Leanor Kail, MD;  Location: [place];  Service: Orthopedics;  Laterality: Left;  CPAP  . [place]  RESECTION     December 20 14th  . NM MYOVIEW LTD  June 28, 2014   [facility]: [facility] -- no evidence of ischemia or infarction. Normal EF ~58%  . PORTA CATH INSERTION N/A 03/15/2017   Procedure: Glori Luis Cath Insertion;  Surgeon: Algernon Huxley, MD;  Location: [place] [facility];  Service: Cardiovascular;  Laterality: N/A;  . RADIOFREQUENCY ABLATION of Ventricular Tachycardia  July 2000 the   RVOT VT ablation: [facility], [place]    . TRANSTHORACIC ECHOCARDIOGRAM  May 2015   [facility] - Dr. Sharyn Lull: EF 55%, mild LVH; otherwise mostly normal    Allergies Patient has no known allergies.  Social History Social History  Substance Use Topics  . Smoking status: Former Smoker    Quit date: 11/24/1999  . Smokeless tobacco: Never Used  . Alcohol use No    Review of Systems Constitutional: Negative for fever. Eyes: Negative for vision changes ENT:  Negative for congestion, sore throat Cardiovascular: Negative for chest pain. Respiratory: Negative for shortness of breath. Gastrointestinal: Positive for diarrhea and nausea Genitourinary: Negative for dysuria. Musculoskeletal: Negative for back pain. Skin: Negative for rash. Neurological: Negative for headaches, positive for weakness  All systems negative/normal/unremarkable except as stated in the HPI  ____________________________________________   PHYSICAL EXAM:  VITAL SIGNS: ED Triage Vitals  Enc Vitals Group     BP 04/08/17 1740 106/71     Pulse Rate 04/08/17 1740 (!) 107     Resp 04/08/17 1740 18     Temp 04/08/17 1740 98.1 F (36.7 C)     Temp Source 04/08/17 1740 Oral     SpO2 04/08/17 1740 99 %     Weight 04/08/17 1741 269 lb (122 kg)     Height 04/08/17 1741 6\' 5"  (1.956 m)     Head Circumference --      Peak Flow --      Pain Score 04/08/17 1740 0     Pain Loc --      Pain Edu? --      Excl. in [place]? --     Constitutional: Alert and oriented. Well appearing and in  no distress. Eyes: Conjunctivae are normal. PERRL. Normal extraocular movements. ENT   Head: Normocephalic and atraumatic.   Nose: No congestion/rhinnorhea.   Mouth/Throat: Mucous membranes are moist.   Neck: No stridor. Cardiovascular: Normal rate, regular rhythm. No murmurs, rubs, or gallops. Respiratory: Normal respiratory effort without tachypnea nor retractions. Breath sounds are clear and equal bilaterally. No wheezes/rales/rhonchi. Gastrointestinal: Soft and nontender. Normal bowel sounds Musculoskeletal: Nontender with normal range of motion in extremities. No lower extremity tenderness nor edema. Neurologic:  Normal speech and language. No gross focal neurologic deficits are appreciated.  Skin:  Skin is warm, dry and intact. No rash noted. Psychiatric: Mood and affect are normal. Speech and behavior are normal.  ____________________________________________  ED COURSE:  Pertinent labs & imaging results that were available during my care of the patient were reviewed by me and considered in my medical decision making (see chart for details). Patient presents  for diarrhea and nausea, we will assess with labs and imaging as indicated.   Procedures ____________________________________________   LABS (pertinent positives/negatives)  Labs Reviewed  COMPREHENSIVE METABOLIC PANEL - Abnormal; Notable for the following:       Result Value   Potassium 5.3 (*)    Glucose, Bld 119 (*)    BUN 23 (*)    Alkaline Phosphatase 128 (*)    All other components within normal limits  CBC - Abnormal; Notable for the following:    WBC 1.0 (*)    HCT 39.3 (*)    All other components within normal limits  C DIFFICILE QUICK SCREEN W PCR REFLEX  GASTROINTESTINAL PANEL BY PCR, STOOL (REPLACES STOOL CULTURE)  LIPASE, BLOOD  URINALYSIS, COMPLETE (UACMP) WITH MICROSCOPIC  ___________________________________________  FINAL ASSESSMENT AND PLAN  Diarrhea, nausea  Plan: Patient's  labs were dictated above. Patient had presented for profuse diarrhea likely chemotherapy related. He was given IV fluids as well as antiemetics. Leukopenia was noted on labs which is not unusual for him after chemotherapy treatments.   Earleen Newport, MD   Note: This note was generated in part or whole with voice recognition software. Voice recognition is usually quite accurate but there are transcription errors that can and very often do occur. I apologize for any typographical errors that were not detected and corrected.     Earleen Newport, MD 04/08/17 [phone]

## 2017-04-08 NOTE — ED Notes (Signed)
RN questioned patient as to whether he would like his port accessed or a peripheral line for access and patient reported his preferences as peripheral line.

## 2017-04-08 NOTE — ED Notes (Signed)
Pt & wife noted sitting in subwait; wife inquiring over wait; st her husband is a CA patient receiving chemo and was sent over for dehydration and IVFs; explained to wife that pt has not received such because he has a portacath in place and should be done under sterile condition, not in triage; wife voices understanding but then requests a peripheral IV be placed if necessary; reviewed pt's chart and labs and charge nurse called for an exam room; pt taken to room 15 via w/c and Dr Jimmye Norman notified

## 2017-04-08 NOTE — ED Notes (Signed)
Patient c/o abdominal cramping, nausea and 1 episode of diarrhea. Pt reports intermittent constipation for several months. Pt took stool softeners and prune juice as recommended by PCP and had 4 large BMs, and one episode of diarrhea/soft stool today

## 2017-04-08 NOTE — ED Provider Notes (Signed)
-----------------------------------------   10:49 PM on 04/08/2017 -----------------------------------------  Patient's urinalysis has come back normal. Patient states he feels well and he wants to go home. Patient's blood pressure is around 91/56 currently. I reviewed the patient's records this does not appear to be far off from his baseline or frequently recorded blood pressures. Patient is comfortable going home. We will discharge at this time.   Harvest Dark, MD 04/08/17 2250

## 2017-04-08 NOTE — Telephone Encounter (Signed)
Wife called to ask if she should take him to the ER or if there is something we can do for him. He is lying in the bathroom floor with abdominal cramps and cannot get up. Informed per VO Dr Grayland Ormond to take him to the ER for evaluation. She stated she thinks he needs IVF anyway

## 2017-04-08 NOTE — ED Triage Notes (Signed)
Pt reports last chemo treatment last week 05/10. Pt reports diarrhea and nausea today. Pt wife reports pt had prednisone crash two days ago.

## 2017-04-13 ENCOUNTER — Telehealth: Payer: Self-pay | Admitting: *Deleted

## 2017-04-13 MED ORDER — MAGIC MOUTHWASH W/LIDOCAINE: 5.0000 mL | Freq: Four times a day (QID) | ORAL | 3 refills | Status: DC

## 2017-04-13 NOTE — Telephone Encounter (Signed)
Nicholas Reynolds called to report that he is having sore throat for several days, it is more painful when he swallows, it hurts down the back of his throat, no fever. He also reports that his tongue feels swollen, it is red, but there are no white patches. Please advise

## 2017-04-13 NOTE — Telephone Encounter (Signed)
Patients wife informed per VO Dr Grayland Ormond that Magic Mouthwash is to be prescribed. Rx Faxed

## 2017-04-15 ENCOUNTER — Inpatient Hospital Stay: Payer: 59

## 2017-04-15 ENCOUNTER — Inpatient Hospital Stay (HOSPITAL_BASED_OUTPATIENT_CLINIC_OR_DEPARTMENT_OTHER): Payer: 59 | Admitting: Oncology

## 2017-04-15 VITALS — BP 96/71 | Temp 97.7°F | Wt 259.6 lb

## 2017-04-15 VITALS — BP 102/66 | HR 80

## 2017-04-15 DIAGNOSIS — C8338 Diffuse large B-cell lymphoma, lymph nodes of multiple sites: Secondary | ICD-10-CM

## 2017-04-15 DIAGNOSIS — T402X5A Adverse effect of other opioids, initial encounter: Secondary | ICD-10-CM

## 2017-04-15 DIAGNOSIS — K5903 Drug induced constipation: Secondary | ICD-10-CM

## 2017-04-15 DIAGNOSIS — E860 Dehydration: Secondary | ICD-10-CM | POA: Diagnosis not present

## 2017-04-15 DIAGNOSIS — Z5111 Encounter for antineoplastic chemotherapy: Secondary | ICD-10-CM

## 2017-04-15 DIAGNOSIS — Z79899 Other long term (current) drug therapy: Secondary | ICD-10-CM

## 2017-04-15 DIAGNOSIS — M1710 Unilateral primary osteoarthritis, unspecified knee: Secondary | ICD-10-CM | POA: Insufficient documentation

## 2017-04-15 DIAGNOSIS — M1712 Unilateral primary osteoarthritis, left knee: Secondary | ICD-10-CM | POA: Insufficient documentation

## 2017-04-15 LAB — COMPREHENSIVE METABOLIC PANEL
ALBUMIN: 3.7 g/dL (ref 3.5–5.0)
ALK PHOS: 100 U/L (ref 38–126)
ALT: 22 U/L (ref 17–63)
ANION GAP: 4 — AB (ref 5–15)
AST: 25 U/L (ref 15–41)
BUN: 13 mg/dL (ref 6–20)
CHLORIDE: 103 mmol/L (ref 101–111)
CO2: 29 mmol/L (ref 22–32)
Calcium: 9.4 mg/dL (ref 8.9–10.3)
Creatinine, Ser: 1.06 mg/dL (ref 0.61–1.24)
GFR calc Af Amer: >60 mL/min (ref >60)
GFR calc non Af Amer: >60 mL/min (ref >60)
GLUCOSE: 137 mg/dL — AB (ref 65–99)
Potassium: 4.3 mmol/L (ref 3.5–5.1)
SODIUM: 136 mmol/L (ref 135–145)
Total Bilirubin: 0.4 mg/dL (ref 0.3–1.2)
Total Protein: 7 g/dL (ref 6.5–8.1)

## 2017-04-15 LAB — CBC WITH DIFFERENTIAL/PLATELET
BASOS PCT: 1 %
Basophils Absolute: 0.1 10*3/uL (ref 0–0.1)
EOS ABS: 0 10*3/uL (ref 0–0.7)
EOS PCT: 0 %
HCT: 37.5 % — ABNORMAL LOW (ref 40.0–52.0)
Hemoglobin: 12.8 g/dL — ABNORMAL LOW (ref 13.0–18.0)
LYMPHS ABS: 0.4 10*3/uL — AB (ref 1.0–3.6)
Lymphocytes Relative: 3 %
MCH: 28.8 pg (ref 26.0–34.0)
MCHC: 34.2 g/dL (ref 32.0–36.0)
MCV: 84.2 fL (ref 80.0–100.0)
Monocytes Absolute: 0.3 10*3/uL (ref 0.2–1.0)
Monocytes Relative: 2 %
Neutro Abs: 12.8 10*3/uL — ABNORMAL HIGH (ref 1.4–6.5)
Neutrophils Relative %: 94 %
PLATELETS: 246 10*3/uL (ref 150–440)
RBC: 4.45 MIL/uL (ref 4.40–5.90)
RDW: 13.3 % (ref 11.5–14.5)
WBC: 13.6 10*3/uL — ABNORMAL HIGH (ref 3.8–10.6)

## 2017-04-15 MED ORDER — SODIUM CHLORIDE 0.9 % IV SOLN
Freq: Once | INTRAVENOUS | Status: AC
  Administered 2017-04-15: 10:00:00 via INTRAVENOUS
  Filled 2017-04-15: qty 1000

## 2017-04-15 MED ORDER — FAMOTIDINE IN NACL 20-0.9 MG/50ML-% IV SOLN
20.0000 mg | Freq: Two times a day (BID) | INTRAVENOUS | Status: DC
  Administered 2017-04-15: 20 mg via INTRAVENOUS
  Filled 2017-04-15: qty 50

## 2017-04-15 MED ORDER — ACETAMINOPHEN 325 MG PO TABS
650.0000 mg | ORAL_TABLET | Freq: Once | ORAL | Status: AC
  Administered 2017-04-15: 650 mg via ORAL
  Filled 2017-04-15: qty 2

## 2017-04-15 MED ORDER — SODIUM CHLORIDE 0.9% FLUSH
10.0000 mL | INTRAVENOUS | Status: DC | PRN
  Filled 2017-04-15: qty 10

## 2017-04-15 MED ORDER — HEPARIN SOD (PORK) LOCK FLUSH 100 UNIT/ML IV SOLN: 500.0000 [IU] | Freq: Once | INTRAVENOUS | Status: DC | PRN

## 2017-04-15 MED ORDER — DIPHENHYDRAMINE HCL 25 MG PO CAPS
50.0000 mg | ORAL_CAPSULE | Freq: Once | ORAL | Status: AC
  Administered 2017-04-15: 50 mg via ORAL
  Filled 2017-04-15: qty 2

## 2017-04-15 MED ORDER — SODIUM CHLORIDE 0.9 % IV SOLN
20.0000 mg | Freq: Once | INTRAVENOUS | Status: AC
  Administered 2017-04-15: 20 mg via INTRAVENOUS
  Filled 2017-04-15: qty 2

## 2017-04-15 MED ORDER — DOXORUBICIN HCL CHEMO IV INJECTION 2 MG/ML
50.0000 mg/m2 | Freq: Once | INTRAVENOUS | Status: AC
Start: 2017-04-15 — End: 2017-04-15
  Administered 2017-04-15: 132 mg via INTRAVENOUS
  Filled 2017-04-15: qty 66

## 2017-04-15 MED ORDER — DEXAMETHASONE SODIUM PHOSPHATE 10 MG/ML IJ SOLN: 20.0000 mg | Freq: Once | INTRAMUSCULAR | Status: DC

## 2017-04-15 MED ORDER — SODIUM CHLORIDE 0.9 % IV SOLN
2000.0000 mg | Freq: Once | INTRAVENOUS | Status: AC
  Administered 2017-04-15: 2000 mg via INTRAVENOUS
  Filled 2017-04-15: qty 100

## 2017-04-15 MED ORDER — SODIUM CHLORIDE 0.9 % IV SOLN
Freq: Once | INTRAVENOUS | Status: AC
  Administered 2017-04-15: 09:00:00 via INTRAVENOUS
  Filled 2017-04-15: qty 1000

## 2017-04-15 MED ORDER — SODIUM CHLORIDE 0.9% FLUSH
10.0000 mL | INTRAVENOUS | Status: DC | PRN
  Administered 2017-04-15: 10 mL via INTRAVENOUS
  Filled 2017-04-15: qty 10

## 2017-04-15 MED ORDER — PEGFILGRASTIM 6 MG/0.6ML ~~LOC~~ PSKT
6.0000 mg | PREFILLED_SYRINGE | Freq: Once | SUBCUTANEOUS | Status: AC
  Administered 2017-04-15: 6 mg via SUBCUTANEOUS
  Filled 2017-04-15: qty 0.6

## 2017-04-15 MED ORDER — PALONOSETRON HCL INJECTION 0.25 MG/5ML
0.2500 mg | Freq: Once | INTRAVENOUS | Status: AC
  Administered 2017-04-15: 0.25 mg via INTRAVENOUS
  Filled 2017-04-15: qty 5

## 2017-04-15 MED ORDER — VINCRISTINE SULFATE CHEMO INJECTION 1 MG/ML
2.0000 mg | Freq: Once | INTRAVENOUS | Status: AC
  Administered 2017-04-15: 2 mg via INTRAVENOUS
  Filled 2017-04-15: qty 2

## 2017-04-15 MED ORDER — SODIUM CHLORIDE 0.9 % IV SOLN: 375.0000 mg/m2 | Freq: Once | INTRAVENOUS | Status: DC

## 2017-04-15 MED ORDER — SODIUM CHLORIDE 0.9 % IV SOLN
1000.0000 mg | Freq: Once | INTRAVENOUS | Status: AC
  Administered 2017-04-15: 1000 mg via INTRAVENOUS
  Filled 2017-04-15: qty 100

## 2017-04-15 MED ORDER — HEPARIN SOD (PORK) LOCK FLUSH 100 UNIT/ML IV SOLN
500.0000 [IU] | Freq: Once | INTRAVENOUS | Status: AC
  Administered 2017-04-15: 500 [IU] via INTRAVENOUS

## 2017-04-15 NOTE — Progress Notes (Signed)
Hematology/Oncology Consult note [facility]  Telephone:([phone] [phone]  Patient Care Team: Valera Castle, MD as PCP - General   Name of the patient: Nicholas Reynolds  [id number]  11-28-1950   Date of visit: 04/15/17  Diagnosis- Stage III DLBCL  Chief complaint/ Reason for visit- on treatment assessment prior to cycle 3 of RCHOP dose dense  Heme/Onc history: 66 yr old male diagnsoed with Stage III DLBC receiving dose dense RCHOP. BM negative for lymphoma. 1. Hypermetabolic extrapleural lesions, peritoneal nodules and retroperitoneal/right inguinal lymph nodes, worrisome for lymphoma.  Interval history- patient had a massive bowel movement on Thursday which made him extremely fatigued and dehydrated and had to go to the ER. Reports feeling better since then his last bowel movement was about 3 days ago. He does report constipation. Pain is well controlled  ECOG PS- 2 Pain scale- 3 Opioid associated constipation- yes  Review of systems- Review of Systems  Constitutional: Positive for malaise/fatigue. Negative for chills, fever and weight loss.  HENT: Negative for congestion, ear discharge and nosebleeds.   Eyes: Negative for blurred vision.  Respiratory: Negative for cough, hemoptysis, sputum production, shortness of breath and wheezing.   Cardiovascular: Negative for chest pain, palpitations, orthopnea and claudication.  Gastrointestinal: Positive for constipation. Negative for abdominal pain, blood in stool, diarrhea, heartburn, melena, nausea and vomiting.  Genitourinary: Negative for dysuria, flank pain, frequency, hematuria and urgency.  Musculoskeletal: Negative for back pain, joint pain and myalgias.  Skin: Negative for rash.  Neurological: Positive for weakness. Negative for dizziness, tingling, focal weakness, seizures and headaches.  Endo/Heme/Allergies: Does not bruise/bleed easily.  Psychiatric/Behavioral: Negative  for depression and suicidal ideas. The patient does not have insomnia.        No Known Allergies   Past Medical History:  Diagnosis Date  . Arthritis    thumbs  . Asymptomatic varicose veins of bilateral lower extremities    Uncomfortable but in no ulcers  . Cancer ([place])   . Complication of anesthesia    slow to wake after hernia surg.  was taking "a bunch" of herbals at that time  . Coronary artery disease, non-occlusive July 2008   Nonobstructive by cath   . Family history of premature coronary artery disease    Father  . GERD (gastroesophageal reflux disease)   . 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 Obstructive sleep apnea    severe per study 09/05/14   . Paroxysmal atrial fibrillation ([place]) 2002   a) Initially diagnosed as Lone A Fib while in [place] (on ASA & BB after) - was on warfarin for a couple years and a Flecainide Pill-in Pocket PRN; b) recurrent A. fib with RVR August 2050 - status post cardioversion with flecainide, on low-dose beta blocker  . Pre-syncope May 2015   Unrevealing monitor in May  . Uses hearing aid    bilateral  . Wears dentures    partial upper     Past Surgical History:  Procedure Laterality Date  . CARDIAC CATHETERIZATION  2008   Nonobstructive CAD  . CHOLECYSTECTOMY    . ELBOW SURGERY    . Event monitor  May 2015   Unrevealing  . HERNIA REPAIR    .  KNEE ARTHROSCOPY Left 06/28/2015   Procedure: ARTHROSCOPY KNEE WITH SYNOVECTOMY;  Surgeon: Leanor Kail, MD;  Location: [facility];  Service: Orthopedics;  Laterality: Left;  CPAP  . [place] RESECTION     December 20 14th  . NM MYOVIEW LTD  June 28, 2014   [facility] -- no evidence of ischemia or infarction. Normal EF ~58%  . PORTA CATH INSERTION N/A 03/15/2017   Procedure: Glori Luis Cath Insertion;  Surgeon: Algernon Huxley, MD;  Location: [facility];  Service: Cardiovascular;  Laterality: N/A;  . RADIOFREQUENCY ABLATION of Ventricular Tachycardia  July 2000 the   RVOT VT ablation: [facility], [place]    . TRANSTHORACIC ECHOCARDIOGRAM  May 2015   [facility] - Dr. Sharyn Lull: EF 55%, mild LVH; otherwise mostly normal    Social History   Social History  . Marital status: Married    Spouse name: N/A  . Number of children: N/A  . Years of education: N/A   Occupational History  . Not on file.   Social History Main Topics  . Smoking status: Former Smoker    Quit date: 11/24/1999  . Smokeless tobacco: Never Used  . Alcohol use No  . Drug use: No  . Sexual activity: Not on file   Other Topics Concern  . Not on file   Social History Narrative   Former smoker who quit over 15 years ago and does not drink alcohol or use drugs.   He is married with 2 children. Lives in [place], [place].  Works as an Chief Financial Officer for [employer].    Family History  Problem Relation Age of Onset  . Breast cancer Mother   . Bone cancer Mother   . COPD Father   . Arrhythmia Father   . Heart failure Father   . Heart attack Father      Current Outpatient Prescriptions:  .  allopurinol (ZYLOPRIM) 300 MG tablet, Take 1 tablet (300 mg total) by mouth daily., Disp: 30 tablet, Rfl: 3 .  lidocaine-prilocaine (EMLA) cream, Apply to affected area once, Disp: 30 g, Rfl: 3 .  magic mouthwash w/lidocaine SOLN, Take 5 mLs by mouth 4 (four) times daily., Disp: 480 mL, Rfl: 3 .  metoprolol succinate (TOPROL-XL) 50 MG 24 hr tablet, Take 50 mg by mouth 2 (two) times daily. Take with or immediately following a meal., Disp: , Rfl:  .  Multiple Vitamin (MULTIVITAMIN) tablet, Take 1 tablet by mouth daily., Disp: , Rfl:  .   omeprazole (PRILOSEC) 40 MG capsule, Take 1 capsule (40 mg total) by mouth 2 (two) times daily., Disp: 180 capsule, Rfl: 1 .  ondansetron (ZOFRAN) 8 MG tablet, Take 1 tablet (8 mg total) by mouth 2 (two) times daily as needed for refractory nausea / vomiting. Start on day 3 after cyclophosphamide chemotherapy., Disp: 60 tablet, Rfl: 2 .  Oxycodone HCl 10 MG TABS, Take 1 tablet (10 mg total) by mouth every 6 (six) hours as needed., Disp: 90 tablet, Rfl: 0 .  predniSONE (DELTASONE) 20 MG tablet, Take 5 tablets (100 mg) daily on days 1 - 5 of chemo (Patient not taking: Reported on 04/08/2017), Disp: 50 tablet, Rfl: 2 .  prochlorperazine (COMPAZINE) 10 MG tablet, Take 1 tablet (10 mg total) by mouth every 6 (six) hours as needed (Nausea or vomiting)., Disp: 60 tablet, Rfl: 6 .  tamsulosin (FLOMAX) 0.4 MG CAPS capsule, Take 0.4 mg by mouth daily.  PM, Disp: , Rfl:  .  warfarin (COUMADIN) 5 MG tablet, Take 5 mg by mouth daily. Mon and Fri 7.5 mg, all other days 5 mg, Disp: , Rfl:   Physical exam:  Vitals:   04/15/17 0856  BP: 96/71  Temp: 97.7 F (36.5 C)  Weight: 259 lb 9.6 oz (117.8 kg)   Physical Exam  Constitutional: He is oriented to person, place, and time.  Appears fatigued  HENT:  Head: Normocephalic and atraumatic.  Eyes: EOM are normal. Pupils are equal, round, and reactive to light.  Neck: Normal range of motion.  Cardiovascular: Normal rate, regular rhythm and normal heart sounds.   Pulmonary/Chest: Effort normal and breath sounds normal.  Abdominal: Soft. Bowel sounds are normal.  Neurological: He is alert and oriented to person, place, and time.  Skin: Skin is warm and dry.     CMP Latest Ref Rng & Units 04/08/2017  Glucose 65 - 99 mg/dL 119(H)  BUN 6 - 20 mg/dL 23(H)  Creatinine 0.61 - 1.24 mg/dL 1.08  Sodium 135 - 145 mmol/L 135  Potassium 3.5 - 5.1 mmol/L 5.3(H)  Chloride 101 - 111 mmol/L 101  CO2 22 - 32 mmol/L 28  Calcium 8.9 - 10.3 mg/dL 9.3  Total Protein 6.5 - 8.1  g/dL 6.8  Total Bilirubin 0.3 - 1.2 mg/dL 1.1  Alkaline Phos 38 - 126 U/L 128(H)  AST 15 - 41 U/L 23  ALT 17 - 63 U/L 46   CBC Latest Ref Rng & Units 04/08/2017  WBC 3.8 - 10.6 K/uL 1.0(LL)  Hemoglobin 13.0 - 18.0 g/dL 13.3  Hematocrit 40.0 - 52.0 % 39.3(L)  Platelets 150 - 440 K/uL 216    No images are attached to the encounter.  Nm Cardiac Muga Rest  Result Date: 03/16/2017 CLINICAL DATA:  Large cell lymphoma, high risk chemotherapy EXAM: NUCLEAR MEDICINE CARDIAC BLOOD POOL IMAGING (MUGA) TECHNIQUE: Cardiac multi-gated acquisition was performed at rest following intravenous injection of Tc-57m labeled red blood cells. RADIOPHARMACEUTICALS:  21.63 mCi Tc-41m pertechnetate in-vitro labeled red blood cells IV COMPARISON:  None. FINDINGS: LEFT ventricular ejection fraction is calculated at 66%, within the normal range. Study was obtained at a cardiac rate of 69 beats per minute. Patient was rhythmic during imaging. Wall motion analysis of the LEFT ventricle in 3 projections is normal IMPRESSION: Normal LEFT ventricular ejection fraction of 66%. Normal LV wall motion. Electronically Signed   By: Lavonia Dana M.D.   On: 03/16/2017 15:49   Ct Biopsy  Result Date: 03/17/2017 INDICATION: Diffuse large B-cell lymphoma EXAM: CT GUIDED RIGHT ILIAC BONE MARROW ASPIRATION AND CORE BIOPSY Date:  4/25/20184/25/2018 9:35 am Radiologist:  M. Daryll Brod, MD Guidance:  CT FLUOROSCOPY TIME:  Fluoroscopy Time: NONE. MEDICATIONS: None. ANESTHESIA/SEDATION: 3.0 mg IV Versed; 100 mcg IV Fentanyl Moderate Sedation Time:  15 minutes The patient was continuously monitored during the procedure by the interventional radiology nurse under my direct supervision. CONTRAST:  None. COMPLICATIONS: None PROCEDURE: Informed consent was obtained from the patient following explanation of the procedure, risks, benefits and alternatives. The patient understands, agrees and consents for the procedure. All questions were addressed. A  time out was performed. The patient was positioned prone and non-contrast localization CT was performed of the pelvis to demonstrate the iliac marrow spaces. Maximal barrier sterile technique utilized including caps, mask, sterile gowns, sterile gloves, large sterile drape, hand hygiene, and Betadine prep. Under sterile conditions and local anesthesia, an 11 gauge coaxial bone biopsy needle was advanced into  the right iliac marrow space. Needle position was confirmed with CT imaging. Initially, bone marrow aspiration was performed. Next, the 11 gauge outer cannula was utilized to obtain a right iliac bone marrow core biopsy. Needle was removed. Hemostasis was obtained with compression. The patient tolerated the procedure well. Samples were prepared with the cytotechnologist. No immediate complications. IMPRESSION: CT guided right iliac bone marrow aspiration and core biopsy. Electronically Signed   By: Jerilynn Mages.  Shick M.D.   On: 03/17/2017 09:58     Assessment and plan- Patient is a 66 y.o. male with stage III DLBCL patient of Dr. Grayland Ormond heer for cycle 3 of dose dense RCHOP  1. Counts ok to proceed with dose dense RCHOP # 3 today with neulasta support. He will get the same pre meds as in cycle 2 due to mild allergic reaction to rituxan with cycle 1  2. Opioid induced constipation I have suggested that he should tablets of senna at night along with Dulcolax  3. Dehydration we will give him 1 L of normal saline along with chemotherapy today  Return to clinic in 2 weeks prior to cycle #4 of dose dense R CHOP along with CBC and CMP.      Visit Diagnosis 1. 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[place])   2. Encounter for antineoplastic chemotherapy   3. Therapeutic opioid induced constipation      Dr. Randa Evens, MD, MPH [facility] Pager- [phone] 04/15/2017 9:17 AM

## 2017-04-15 NOTE — Progress Notes (Signed)
Patient denies pain or discomfort at this time.  Patient states he's had some discomfort from constipation ver the last couple of weeks but the Dr. Is aware of it.  Patient ambulated independently to exam room 4 without assistance, accompanied by his wife

## 2017-04-15 NOTE — Progress Notes (Signed)
Nutrition Follow-up:  Spoke with patient and wife during infusion this am.  Patient receiving chemotherapy for lymphoma. Patient followed by Dr. Grayland Ormond.  Noted recent ED visit for diarrhea and nausea (5/17).  Wife reports patient was having such a hard time with constipation then diarrhea started.  Wife reports think tumor in abdomin has shrunk and not pressing on colon so hopefully constipation will be better.  Patient reports he has been eating better and feeling better recently.  Patient reports he was able to eat meatloaf the other night, eggs with spinach and tomato this am and bagel with jelly.  Does not like peanut butter.  Eats fruits and vegetables as well.  Has been drinking body armour drink sports drink (70 calories, with small amount of Na and elevated K, B vitamins noted).  Patient reports he knows he is not eating enough calories as only eating very small amounts of food.     Medications: reviewed  Labs: glucose 137, hgb 12.8  Anthropometrics:   Weight today 259 lb 9.6 oz decreased from weight on 5/3 of 269 lb  4% weight loss in the last 21 days  Estimated Energy Needs  Kcals: 2500 -2900 calories/d Protein: 142 -190 g/d (IBW of 95 kg 1.5-2.0 g/d) Fluid: 2.5 L/d  NUTRITION DIAGNOSIS: Inadequate oral intake continues   MALNUTRITION DIAGNOSIS: Moderate malnutrition continues   INTERVENTION:   Discussed importance of high calorie, high protein foods.  Patient pleased with weight loss. Wife reports goal weight after bariatric surgery was 250 pounds (surgery in 2014).  Patient verbalized understanding of good nutrition with treatment.  Recommend at this time boost plus/ensure plus supplements with > 350 calories or more at this time to better meet calorie needs. Has been drinking boost original.  Samples and coupons given to wife.   Recommend patient take bariatric specific multivitamin and Calcium 500mg  TID as he has currently only been taking a general MVI.  Written  options given to wife regarding supplements. Discussed with patient and wife that these supplements should be taking for life.  Discussed interactions with pharmacist Nicholas Reynolds with current chemo regimen and patient still able to take these tablets.   Patient could benefit from Vit B12, Fe, thiamine, folate, Vit D levels being checked as has not followed up with bariatric surgeon since surgery.  Also has not been taking MVI and calcium supplements as well has not eating well.  Will discussed with MD Grayland Ormond.    MONITORING, EVALUATION, GOAL: Patient will consume adequate calories and protein to maintain lean muscle mass.    NEXT VISIT: June 7th during infusion  Nicholas Reynolds B. Zenia Resides, Nicholas Reynolds, Nicholas Reynolds Registered Dietitian [phone] (pager)

## 2017-04-27 DIAGNOSIS — Z8249 Family history of ischemic heart disease and other diseases of the circulatory system: Secondary | ICD-10-CM | POA: Insufficient documentation

## 2017-04-28 ENCOUNTER — Other Ambulatory Visit: Payer: Self-pay | Admitting: Oncology

## 2017-04-28 NOTE — Progress Notes (Signed)
[place]  Telephone:(336) [phone] Fax:(336) [phone]  ID: Nicholas Reynolds OB: 1951-05-27  MR#: [id number]  MAU#:[id number]  Patient Care Team: Valera Castle, MD as PCP - General  CHIEF COMPLAINT: Stage III DLBCL.  INTERVAL HISTORY: Patient returns to clinic today for further evaluation and consideration of cycle 4 of R-CHOP chemotherapy. He is tolerating his treatments well. He does not complain of increased nausea or fatigue today. His appetite has improved. He does not complain of pain today. He continues to have problems with constipation. He denies any fevers or illnesses. He has no neurologic complaints. He denies any shortness of breath, cough, or hemoptysis. He has no urinary complaints. Patient offers no further specific complaints.  REVIEW OF SYSTEMS:   Review of Systems  Constitutional: Negative for fever, malaise/fatigue and weight loss.  Respiratory: Negative for cough, hemoptysis and shortness of breath.   Cardiovascular: Negative for chest pain and leg swelling.  Gastrointestinal: Positive for constipation. Negative for abdominal pain, blood in stool, diarrhea, melena, nausea and vomiting.  Genitourinary: Negative.   Musculoskeletal: Positive for back pain.  Neurological: Negative for sensory change and weakness.  Psychiatric/Behavioral: The patient is nervous/anxious and has insomnia.     As per HPI. Otherwise, a complete review of systems is negative.  PAST MEDICAL HISTORY: Past Medical History:  Diagnosis Date  . Arthritis    thumbs  . Asymptomatic varicose veins of bilateral lower extremities    Uncomfortable but in no ulcers  . Cancer ([place])   . Complication of anesthesia    slow to wake after hernia surg.  was taking "a bunch" of herbals at that time  . Coronary artery disease, non-occlusive July 2008   Nonobstructive by cath   . Family history of premature coronary artery disease    Father  . GERD (gastroesophageal reflux  disease)   . 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 Obstructive sleep apnea    severe per study 09/05/14   . Paroxysmal atrial fibrillation ([place]) 2002   a) Initially diagnosed as Lone A Fib while in [place] (on ASA & BB after) - was on warfarin for a couple years and a Flecainide Pill-in Pocket PRN; b) recurrent A. fib with RVR August 2050 - status post cardioversion with flecainide, on low-dose beta blocker  . Pre-syncope May 2015   Unrevealing monitor in May  . Uses hearing aid    bilateral  . Wears dentures    partial upper    PAST SURGICAL HISTORY: Past Surgical History:  Procedure Laterality Date  . CARDIAC CATHETERIZATION  2008   Nonobstructive CAD  . CHOLECYSTECTOMY    . ELBOW SURGERY    . Event monitor  May 2015   Unrevealing  . HERNIA REPAIR    . KNEE ARTHROSCOPY Left 06/28/2015   Procedure: ARTHROSCOPY KNEE WITH SYNOVECTOMY;  Surgeon: Leanor Kail, MD;  Location: [facility];  Service: Orthopedics;  Laterality: Left;  CPAP  . [place] RESECTION     December 20 14th  . NM MYOVIEW LTD  June 28, 2014   [facility]: [facility] -- no evidence of ischemia or infarction. Normal EF ~58%  . PORTA CATH INSERTION N/A 03/15/2017   Procedure: Glori Luis Cath  Insertion;  Surgeon: Algernon Huxley, MD;  Location: [facility];  Service: Cardiovascular;  Laterality: N/A;  . RADIOFREQUENCY ABLATION of Ventricular Tachycardia  July 2000 the   RVOT VT ablation: [facility], [place]    . TRANSTHORACIC ECHOCARDIOGRAM  May 2015   [facility] - Dr. Sharyn Lull: EF 55%, mild LVH; otherwise mostly normal    FAMILY  HISTORY: Family History  Problem Relation Age of Onset  . Breast cancer Mother   . Bone cancer Mother   . COPD Father   . Arrhythmia Father   . Heart failure Father   . Heart attack Father     ADVANCED DIRECTIVES (Y/N):  N  HEALTH MAINTENANCE: Social History  Substance Use Topics  . Smoking status: Former Smoker    Quit date: 11/24/1999  . Smokeless tobacco: Never Used  . Alcohol use No     Colonoscopy:  PAP:  Bone density:  Lipid panel:  No Known Allergies  Current Outpatient Prescriptions  Medication Sig Dispense Refill  . allopurinol (ZYLOPRIM) 300 MG tablet Take 1 tablet (300 mg total) by mouth daily. 30 tablet 3  . lidocaine-prilocaine (EMLA) cream Apply to affected area once 30 g 3  . magic mouthwash w/lidocaine SOLN Take 5 mLs by mouth 4 (four) times daily. 480 mL 3  . metoprolol succinate (TOPROL-XL) 50 MG 24 hr tablet Take 25 mg by mouth 2 (two) times daily. Take with or immediately following a meal.     . Multiple Vitamin (MULTIVITAMIN) tablet Take 1 tablet by mouth daily.    Marland Kitchen omeprazole (PRILOSEC) 40 MG capsule Take 1 capsule (40 mg total) by mouth 2 (two) times daily. 180 capsule 1  . ondansetron (ZOFRAN) 8 MG tablet Take 1 tablet (8 mg total) by mouth 2 (two) times daily as needed for refractory nausea / vomiting. Start on day 3 after cyclophosphamide chemotherapy. 60 tablet 2  . Oxycodone HCl 10 MG TABS Take 1 tablet (10 mg total) by mouth every 6 (six) hours as needed. 90 tablet 0  . predniSONE (DELTASONE) 20 MG tablet Take 5 tablets (100 mg) daily on days 1 - 5 of chemo 50 tablet 2  . prochlorperazine (COMPAZINE) 10 MG tablet Take 1 tablet (10 mg total) by mouth every 6 (six) hours as needed (Nausea or vomiting). 60 tablet 6  . tamsulosin (FLOMAX) 0.4 MG CAPS capsule Take 0.4 mg by mouth daily. PM    . warfarin (COUMADIN) 5 MG tablet Take 5 mg by mouth daily. Mon and Fri 7.5 mg, all other days 5 mg     No current facility-administered medications for this  visit.    Facility-Administered Medications Ordered in Other Visits  Medication Dose Route Frequency Provider Last Rate Last Dose  . heparin lock flush 100 unit/mL  500 Units Intravenous Once Lloyd Huger, MD      . sodium chloride flush (NS) 0.9 % injection 10 mL  10 mL Intravenous PRN Lloyd Huger, MD   10 mL at 04/29/17 0851    OBJECTIVE: Vitals:   04/29/17 0911  BP: 100/68  Pulse: 91  Resp: 20  Temp: 97 F (36.1 C)     Body mass index is 30.5 kg/m.    ECOG FS:0 - Asymptomatic  General: Well-developed, well-nourished, no acute distress. Eyes: Pink conjunctiva, anicteric sclera. Lungs: Clear to auscultation bilaterally. Heart: Regular rate and rhythm. No rubs, murmurs, or gallops. Abdomen: Soft, nontender, nondistended. No organomegaly  noted, normoactive bowel sounds. Musculoskeletal: No edema, cyanosis, or clubbing. Neuro: Alert, answering all questions appropriately. Cranial nerves grossly intact. Skin: No rashes or petechiae noted. Psych: Normal affect. Lymphatics: No cervical, calvicular, axillary or inguinal LAD.   LAB RESULTS:  Lab Results  Component Value Date   NA 138 04/29/2017   K 3.7 04/29/2017   CL 104 04/29/2017   CO2 26 04/29/2017   GLUCOSE 118 (H) 04/29/2017   BUN 14 04/29/2017   CREATININE 0.94 04/29/2017   CALCIUM 9.3 04/29/2017   PROT 6.4 (L) 04/29/2017   ALBUMIN 3.6 04/29/2017   AST 28 04/29/2017   ALT 21 04/29/2017   ALKPHOS 90 04/29/2017   BILITOT 0.4 04/29/2017   GFRNONAA >60 04/29/2017   GFRAA >60 04/29/2017    Lab Results  Component Value Date   WBC 12.7 (H) 04/29/2017   NEUTROABS 11.7 (H) 04/29/2017   HGB 11.6 (L) 04/29/2017   HCT 33.6 (L) 04/29/2017   MCV 84.2 04/29/2017   PLT 240 04/29/2017     STUDIES: No results found.  ASSESSMENT: Stage III DLBCL.  PLAN:    1. DLBCL: PET scan results reviewed independently and reported as above. Bone marrow biopsy was negative for lymphoma. Pretreatment MUGA scan on  March 16, 2017 reported an EF of 66% repeat in July 2018. Proceed with cycle 4 of dose dense R-CHOP today. We also discussed the possibility of switching treatment every 3 weeks. Patient does not wish to do this at this time and wishes to continue in a dose dense fashion. He also received OnPro Neulasta support. Return to clinic in 2 weeks for consideration of cycle 5. Plan to reimage after cycle 8.  2. Atrial fibrillation: Patient underwent RFA on December 21, 2016. He also had a negative nuclear medicine cardiac stress test on January 13, 2017. 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 Patient will require continued cardiac follow-up in [place]. A referral has been sent. 3. Pain: Improved. Continue oxycodone 10 mg every 6 hours as needed. 4. Constipation: Patient has been instructed to be more vigilant about a daily regimen using Colace twice a day and possibly even MiraLAX. He was also previously given a prescription for lactulose. 5. Nausea: Continue Zofran as prescribed. 6. Neutropenia: Continue Neulasta as above.  Patient expressed understanding and was in agreement with this plan. He also understands that He can call clinic at any time with any questions, concerns, or complaints.   Cancer Staging DLBCL (diffuse large B cell lymphoma) (HCC) Staging form: Hodgkin and Non-Hodgkin Lymphoma, AJCC 8th Edition - Clinical stage from 03/15/2017: Stage III (Diffuse large B-cell lymphoma) - Signed by Lloyd Huger, MD on 03/15/2017  Diffuse large B-cell lymphoma of lymph nodes of multiple regions [facility]) Staging form: Hodgkin and Non-Hodgkin Lymphoma, AJCC 8th Edition - Clinical stage from 03/17/2017: Stage III (Diffuse large B-cell lymphoma) - Signed by Lloyd Huger, MD on 03/17/2017   Lloyd Huger, MD   04/29/2017 9:19 AM

## 2017-04-29 ENCOUNTER — Inpatient Hospital Stay: Payer: 59

## 2017-04-29 ENCOUNTER — Inpatient Hospital Stay: Payer: 59 | Attending: Oncology | Admitting: Oncology

## 2017-04-29 VITALS — BP 100/68 | HR 91 | Temp 97.0°F | Resp 20 | Wt 257.2 lb

## 2017-04-29 DIAGNOSIS — R110 Nausea: Secondary | ICD-10-CM | POA: Insufficient documentation

## 2017-04-29 DIAGNOSIS — I480 Paroxysmal atrial fibrillation: Secondary | ICD-10-CM

## 2017-04-29 DIAGNOSIS — C8338 Diffuse large B-cell lymphoma, lymph nodes of multiple sites: Secondary | ICD-10-CM | POA: Insufficient documentation

## 2017-04-29 DIAGNOSIS — K219 Gastro-esophageal reflux disease without esophagitis: Secondary | ICD-10-CM | POA: Insufficient documentation

## 2017-04-29 DIAGNOSIS — Z5111 Encounter for antineoplastic chemotherapy: Secondary | ICD-10-CM | POA: Diagnosis not present

## 2017-04-29 DIAGNOSIS — Z7901 Long term (current) use of anticoagulants: Secondary | ICD-10-CM | POA: Diagnosis not present

## 2017-04-29 DIAGNOSIS — G4733 Obstructive sleep apnea (adult) (pediatric): Secondary | ICD-10-CM | POA: Diagnosis not present

## 2017-04-29 DIAGNOSIS — I2510 Atherosclerotic heart disease of native coronary artery without angina pectoris: Secondary | ICD-10-CM | POA: Insufficient documentation

## 2017-04-29 DIAGNOSIS — Z79899 Other long term (current) drug therapy: Secondary | ICD-10-CM | POA: Insufficient documentation

## 2017-04-29 DIAGNOSIS — R1310 Dysphagia, unspecified: Secondary | ICD-10-CM | POA: Insufficient documentation

## 2017-04-29 DIAGNOSIS — K5900 Constipation, unspecified: Secondary | ICD-10-CM | POA: Insufficient documentation

## 2017-04-29 DIAGNOSIS — Z87891 Personal history of nicotine dependence: Secondary | ICD-10-CM | POA: Diagnosis not present

## 2017-04-29 DIAGNOSIS — R1900 Intra-abdominal and pelvic swelling, mass and lump, unspecified site: Secondary | ICD-10-CM

## 2017-04-29 LAB — COMPREHENSIVE METABOLIC PANEL
ALBUMIN: 3.6 g/dL (ref 3.5–5.0)
ALK PHOS: 90 U/L (ref 38–126)
ALT: 21 U/L (ref 17–63)
AST: 28 U/L (ref 15–41)
Anion gap: 8 (ref 5–15)
BUN: 14 mg/dL (ref 6–20)
CALCIUM: 9.3 mg/dL (ref 8.9–10.3)
CO2: 26 mmol/L (ref 22–32)
CREATININE: 0.94 mg/dL (ref 0.61–1.24)
Chloride: 104 mmol/L (ref 101–111)
GFR calc Af Amer: >60 mL/min (ref >60)
GFR calc non Af Amer: >60 mL/min (ref >60)
GLUCOSE: 118 mg/dL — AB (ref 65–99)
Potassium: 3.7 mmol/L (ref 3.5–5.1)
SODIUM: 138 mmol/L (ref 135–145)
Total Bilirubin: 0.4 mg/dL (ref 0.3–1.2)
Total Protein: 6.4 g/dL — ABNORMAL LOW (ref 6.5–8.1)

## 2017-04-29 LAB — CBC WITH DIFFERENTIAL/PLATELET
BASOS ABS: 0.1 10*3/uL (ref 0–0.1)
Basophils Relative: 1 %
EOS PCT: 0 %
Eosinophils Absolute: 0 10*3/uL (ref 0–0.7)
HEMATOCRIT: 33.6 % — AB (ref 40.0–52.0)
Hemoglobin: 11.6 g/dL — ABNORMAL LOW (ref 13.0–18.0)
LYMPHS PCT: 4 %
Lymphs Abs: 0.5 10*3/uL — ABNORMAL LOW (ref 1.0–3.6)
MCH: 29 pg (ref 26.0–34.0)
MCHC: 34.4 g/dL (ref 32.0–36.0)
MCV: 84.2 fL (ref 80.0–100.0)
MONOS PCT: 4 %
Monocytes Absolute: 0.5 10*3/uL (ref 0.2–1.0)
NEUTROS ABS: 11.7 10*3/uL — AB (ref 1.4–6.5)
Neutrophils Relative %: 91 %
Platelets: 240 10*3/uL (ref 150–440)
RBC: 3.99 MIL/uL — ABNORMAL LOW (ref 4.40–5.90)
RDW: 13.7 % (ref 11.5–14.5)
WBC: 12.7 10*3/uL — ABNORMAL HIGH (ref 3.8–10.6)

## 2017-04-29 MED ORDER — VINCRISTINE SULFATE CHEMO INJECTION 1 MG/ML
2.0000 mg | Freq: Once | INTRAVENOUS | Status: AC
  Administered 2017-04-29: 2 mg via INTRAVENOUS
  Filled 2017-04-29: qty 2

## 2017-04-29 MED ORDER — ONDANSETRON HCL 8 MG PO TABS: 8.0000 mg | ORAL_TABLET | Freq: Two times a day (BID) | ORAL | 2 refills | Status: DC | PRN

## 2017-04-29 MED ORDER — SODIUM CHLORIDE 0.9 % IV SOLN
1000.0000 mg | Freq: Once | INTRAVENOUS | Status: AC
  Administered 2017-04-29: 1000 mg via INTRAVENOUS
  Filled 2017-04-29: qty 100

## 2017-04-29 MED ORDER — FAMOTIDINE IN NACL 20-0.9 MG/50ML-% IV SOLN
20.0000 mg | Freq: Two times a day (BID) | INTRAVENOUS | Status: DC
Start: 2017-04-29 — End: 2017-04-29
  Administered 2017-04-29: 20 mg via INTRAVENOUS
  Filled 2017-04-29: qty 50

## 2017-04-29 MED ORDER — DEXAMETHASONE SODIUM PHOSPHATE 10 MG/ML IJ SOLN: 20.0000 mg | Freq: Once | INTRAMUSCULAR | Status: DC

## 2017-04-29 MED ORDER — PEGFILGRASTIM 6 MG/0.6ML ~~LOC~~ PSKT
6.0000 mg | PREFILLED_SYRINGE | Freq: Once | SUBCUTANEOUS | Status: AC
  Administered 2017-04-29: 6 mg via SUBCUTANEOUS
  Filled 2017-04-29: qty 0.6

## 2017-04-29 MED ORDER — OXYCODONE HCL 10 MG PO TABS
10.0000 mg | ORAL_TABLET | Freq: Four times a day (QID) | ORAL | 0 refills | Status: DC | PRN
Start: 2017-04-29 — End: 2017-06-07

## 2017-04-29 MED ORDER — ACETAMINOPHEN 325 MG PO TABS
650.0000 mg | ORAL_TABLET | Freq: Once | ORAL | Status: AC
  Administered 2017-04-29: 650 mg via ORAL
  Filled 2017-04-29: qty 2

## 2017-04-29 MED ORDER — SODIUM CHLORIDE 0.9 % IV SOLN
Freq: Once | INTRAVENOUS | Status: AC
  Administered 2017-04-29: 10:00:00 via INTRAVENOUS
  Filled 2017-04-29: qty 1000

## 2017-04-29 MED ORDER — DIPHENHYDRAMINE HCL 25 MG PO CAPS
50.0000 mg | ORAL_CAPSULE | Freq: Once | ORAL | Status: AC
  Administered 2017-04-29: 50 mg via ORAL
  Filled 2017-04-29: qty 2

## 2017-04-29 MED ORDER — SODIUM CHLORIDE 0.9 % IV SOLN
2000.0000 mg | Freq: Once | INTRAVENOUS | Status: AC
  Administered 2017-04-29: 2000 mg via INTRAVENOUS
  Filled 2017-04-29: qty 100

## 2017-04-29 MED ORDER — SODIUM CHLORIDE 0.9% FLUSH
10.0000 mL | INTRAVENOUS | Status: DC | PRN
  Administered 2017-04-29: 10 mL via INTRAVENOUS
  Filled 2017-04-29: qty 10

## 2017-04-29 MED ORDER — DOXORUBICIN HCL CHEMO IV INJECTION 2 MG/ML
50.0000 mg/m2 | Freq: Once | INTRAVENOUS | Status: AC
  Administered 2017-04-29: 132 mg via INTRAVENOUS
  Filled 2017-04-29: qty 66

## 2017-04-29 MED ORDER — HEPARIN SOD (PORK) LOCK FLUSH 100 UNIT/ML IV SOLN: 500.0000 [IU] | Freq: Once | INTRAVENOUS | Status: DC | PRN

## 2017-04-29 MED ORDER — HEPARIN SOD (PORK) LOCK FLUSH 100 UNIT/ML IV SOLN
500.0000 [IU] | Freq: Once | INTRAVENOUS | Status: AC
  Administered 2017-04-29: 500 [IU] via INTRAVENOUS
  Filled 2017-04-29: qty 5

## 2017-04-29 MED ORDER — DEXAMETHASONE SODIUM PHOSPHATE 100 MG/10ML IJ SOLN
20.0000 mg | Freq: Once | INTRAMUSCULAR | Status: AC
  Administered 2017-04-29: 20 mg via INTRAVENOUS
  Filled 2017-04-29: qty 2

## 2017-04-29 MED ORDER — PALONOSETRON HCL INJECTION 0.25 MG/5ML
0.2500 mg | Freq: Once | INTRAVENOUS | Status: AC
  Administered 2017-04-29: 0.25 mg via INTRAVENOUS
  Filled 2017-04-29: qty 5

## 2017-04-29 MED ORDER — SODIUM CHLORIDE 0.9 % IV SOLN: 375.0000 mg/m2 | Freq: Once | INTRAVENOUS | Status: DC

## 2017-04-29 NOTE — Progress Notes (Signed)
Nutrition Follow-up:  Spoke with patient and wife during infusion this am.  Patient receiving chemotherapy for lymphoma. Patient followed by Dr. Grayland Ormond.  Patient and wife report appetite is better. Wife has been preparing a dish with rice, chicken and/or beans with special sauce (includes canola oil, garbanzo beans, spices, tofu) that patient has been eating well.  Patient reports does not really like the 350 calorie supplements due to chalkiness.   Reports that constipation and nausea have been better over the last few weeks.    Medications: reviewed  Labs: reviewed  Anthropometrics:   Weight slightly decreased today to 257 lb 3.2 oz from 259 lb 9.6 oz on 5/24.   5/3 269 lb noted   NUTRITION DIAGNOSIS: Inadequate oral intake continues   MALNUTRITION DIAGNOSIS: Moderate malnutrition continues    INTERVENTION:   Patient pleased with weight loss and stated today, "I do not want to regain my weight. I want to maintain it."  My goal weight after bariatric surgery was 250 lb (surgery in 2014).  Again reviewed the importance of good nutrition, including good sources of protein.  Patient verbalized understanding.  Encouraged patient to consume small frequent meals. Patient will continue taking stool softners to prevent constipation.     MONITORING, EVALUATION, GOAL: Patient will consume adequate calories and protein to maintain lean muscle mass   NEXT VISIT: as needed   Albirta Rhinehart B. Zenia Resides, [place], [place] Registered Dietitian [phone] (pager)

## 2017-04-29 NOTE — Progress Notes (Signed)
Patient denies any concerns today.  

## 2017-05-07 ENCOUNTER — Inpatient Hospital Stay: Payer: 59

## 2017-05-07 ENCOUNTER — Telehealth: Payer: Self-pay | Admitting: *Deleted

## 2017-05-07 VITALS — BP 90/60 | HR 85 | Temp 97.2°F | Resp 18

## 2017-05-07 DIAGNOSIS — C8338 Diffuse large B-cell lymphoma, lymph nodes of multiple sites: Secondary | ICD-10-CM | POA: Diagnosis not present

## 2017-05-07 MED ORDER — SODIUM CHLORIDE 0.9% FLUSH
10.0000 mL | INTRAVENOUS | Status: DC | PRN
  Administered 2017-05-07: 10 mL via INTRAVENOUS
  Filled 2017-05-07: qty 10

## 2017-05-07 MED ORDER — HEPARIN SOD (PORK) LOCK FLUSH 100 UNIT/ML IV SOLN
500.0000 [IU] | Freq: Once | INTRAVENOUS | Status: AC
  Administered 2017-05-07: 500 [IU] via INTRAVENOUS
  Filled 2017-05-07: qty 5

## 2017-05-07 MED ORDER — SODIUM CHLORIDE 0.9 % IV SOLN
Freq: Once | INTRAVENOUS | Status: AC
Start: 2017-05-07 — End: 2017-05-07
  Administered 2017-05-07: 13:00:00 via INTRAVENOUS
  Filled 2017-05-07: qty 1000

## 2017-05-07 NOTE — Telephone Encounter (Signed)
Returned wife's call regarding low BP. Advised her per Dr. Grayland Ormond to bring patient to [facility] center to receive fluids.

## 2017-05-12 NOTE — Progress Notes (Signed)
[place]  Telephone:(336) [phone] Fax:(336) [phone]  ID: Nicholas Reynolds OB: [birth year]  MR#: [id number]  AVW#:[id number]  Patient Care Team: Valera Castle, MD as PCP - General  CHIEF COMPLAINT: Stage III DLBCL.  INTERVAL HISTORY: Patient returns to clinic today for further evaluation and consideration of cycle 5 of R-CHOP chemotherapy. He is tolerating his treatments well. He admits to some mild dysphasia today. He does not complain of increased nausea or fatigue today. His appetite has improved. He does not complain of pain today. He continues to have problems with constipation. He denies any fevers or illnesses. He has no neurologic complaints. He denies any shortness of breath, cough, or hemoptysis. He has no urinary complaints. Patient offers no further specific complaints.  REVIEW OF SYSTEMS:   Review of Systems  Constitutional: Negative for fever, malaise/fatigue and weight loss.  Respiratory: Negative for cough, hemoptysis and shortness of breath.   Cardiovascular: Negative for chest pain and leg swelling.  Gastrointestinal: Positive for constipation. Negative for abdominal pain, blood in stool, diarrhea, melena, nausea and vomiting.  Genitourinary: Negative.   Musculoskeletal: Positive for back pain.  Neurological: Negative for sensory change and weakness.  Psychiatric/Behavioral: The patient is nervous/anxious and has insomnia.     As per HPI. Otherwise, a complete review of systems is negative.  PAST MEDICAL HISTORY: Past Medical History:  Diagnosis Date  . Arthritis    thumbs  . Asymptomatic varicose veins of bilateral lower extremities    Uncomfortable but in no ulcers  . Cancer ([place])   . Complication of anesthesia    slow to wake after hernia surg.  was taking "a bunch" of herbals at that time  . Coronary artery disease, non-occlusive July 2008   Nonobstructive by cath   . Family history of premature coronary artery disease    Father  . GERD (gastroesophageal reflux disease)   . 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 Obstructive sleep apnea    severe per study 09/05/14   . Paroxysmal atrial fibrillation ([place]) 2002   a) Initially diagnosed as Lone A Fib while in [place] (on ASA & BB after) - was on warfarin for a couple years and a Flecainide Pill-in Pocket PRN; b) recurrent A. fib with RVR August 2050 - status post cardioversion with flecainide, on low-dose beta blocker  . Pre-syncope May 2015   Unrevealing monitor in May  . Uses hearing aid    bilateral  . Wears dentures    partial upper    PAST SURGICAL HISTORY: Past Surgical History:  Procedure Laterality Date  . CARDIAC CATHETERIZATION  2008   Nonobstructive CAD  . CHOLECYSTECTOMY    . ELBOW SURGERY    . Event monitor  May 2015   Unrevealing  . HERNIA REPAIR    . KNEE ARTHROSCOPY Left 06/28/2015   Procedure: ARTHROSCOPY KNEE WITH SYNOVECTOMY;  Surgeon: Leanor Kail, MD;  Location: [facility] [place];  Service: Orthopedics;  Laterality: Left;  CPAP  . [place] RESECTION     December 20 14th  . NM MYOVIEW LTD  June 28, 2014   [facility] -- no evidence of ischemia or infarction. Normal EF ~58%  . PORTA CATH INSERTION N/A  03/15/2017   Procedure: Glori Luis Cath Insertion;  Surgeon: Algernon Huxley, MD;  Location: [place] [facility];  Service: Cardiovascular;  Laterality: N/A;  . RADIOFREQUENCY ABLATION of Ventricular Tachycardia  July 2000 the   RVOT VT ablation: [facility] [place]    . TRANSTHORACIC ECHOCARDIOGRAM  May 2015   [facility] - Dr. Sharyn Lull: EF 55%, mild LVH;  otherwise mostly normal    FAMILY HISTORY: Family History  Problem Relation Age of Onset  . Breast cancer Mother   . Bone cancer Mother   . COPD Father   . Arrhythmia Father   . Heart failure Father   . Heart attack Father     ADVANCED DIRECTIVES (Y/N):  N  HEALTH MAINTENANCE: Social History  Substance Use Topics  . Smoking status: Former Smoker    Quit date: 11/24/1999  . Smokeless tobacco: Never Used  . Alcohol use No     Colonoscopy:  PAP:  Bone density:  Lipid panel:  No Known Allergies  Current Outpatient Prescriptions  Medication Sig Dispense Refill  . allopurinol (ZYLOPRIM) 300 MG tablet Take 1 tablet (300 mg total) by mouth daily. 30 tablet 3  . lidocaine-prilocaine (EMLA) cream Apply to affected area once 30 g 3  . magic mouthwash w/lidocaine SOLN Take 5 mLs by mouth 4 (four) times daily. 480 mL 3  . metoprolol tartrate (LOPRESSOR) 25 MG tablet Take 25 mg by mouth 2 (two) times daily.  2  . Multiple Vitamin (MULTIVITAMIN) tablet Take 1 tablet by mouth daily.    Marland Kitchen omeprazole (PRILOSEC) 40 MG capsule Take 1 capsule (40 mg total) by mouth 2 (two) times daily. 180 capsule 1  . ondansetron (ZOFRAN) 8 MG tablet Take 1 tablet (8 mg total) by mouth 2 (two) times daily as needed for refractory nausea / vomiting. Start on day 3 after cyclophosphamide chemotherapy. 60 tablet 2  . Oxycodone HCl 10 MG TABS Take 1 tablet (10 mg total) by mouth every 6 (six) hours as needed. 90 tablet 0  . predniSONE (DELTASONE) 20 MG tablet Take 5 tablets (100 mg) daily on days 1 - 5 of chemo 50 tablet 2  . prochlorperazine (COMPAZINE) 10 MG tablet Take 1 tablet (10 mg total) by mouth every 6 (six) hours as needed (Nausea or vomiting). 60 tablet 6  . tamsulosin (FLOMAX) 0.4 MG CAPS capsule Take 0.4 mg by mouth daily. PM    . warfarin (COUMADIN) 5 MG tablet Take 5 mg by mouth daily. Mon and Fri 7.5 mg, all other days 5 mg     No current facility-administered medications for this visit.      OBJECTIVE: Vitals:   05/13/17 0849  BP: 90/60  Pulse: 94  Resp: 16  Temp: 97.2 F (36.2 C)     Body mass index is 30.86 kg/m.    ECOG FS:0 - Asymptomatic  General: Well-developed, well-nourished, no acute distress. Eyes: Pink conjunctiva, anicteric sclera. HEENT: Clear oropharynx without exudate or erythema. Lungs: Clear to auscultation bilaterally. Heart: Regular rate and rhythm. No rubs, murmurs, or gallops. Abdomen: Soft, nontender, nondistended. No organomegaly noted, normoactive bowel sounds. Musculoskeletal: No edema, cyanosis, or clubbing. Neuro: Alert, answering all questions appropriately. Cranial nerves grossly intact. Skin: No rashes or petechiae noted. Psych: Normal affect. Lymphatics: No cervical, calvicular, axillary or inguinal LAD.   LAB RESULTS:  Lab Results  Component Value Date   NA 136 05/13/2017   K 4.2 05/13/2017   CL 103  05/13/2017   CO2 29 05/13/2017   GLUCOSE 127 (H) 05/13/2017   BUN 14 05/13/2017   CREATININE 0.93 05/13/2017   CALCIUM 9.2 05/13/2017   PROT 6.3 (L) 05/13/2017   ALBUMIN 3.6 05/13/2017   AST 18 05/13/2017   ALT 14 (L) 05/13/2017   ALKPHOS 79 05/13/2017   BILITOT 0.5 05/13/2017   GFRNONAA >60 05/13/2017   GFRAA >60 05/13/2017    Lab Results  Component Value Date   WBC 12.0 (H) 05/13/2017   NEUTROABS 10.7 (H) 05/13/2017   HGB 10.4 (L) 05/13/2017   HCT 30.4 (L) 05/13/2017   MCV 85.0 05/13/2017   PLT 203 05/13/2017     STUDIES: No results found.  ASSESSMENT: Stage III DLBCL.  PLAN:    1. DLBCL: PET scan results reviewed independently and reported as above. Bone marrow biopsy was negative for lymphoma. Pretreatment MUGA scan on March 16, 2017 reported an EF of 66% repeat in July 2018. Proceed with cycle 4 of dose dense R-CHOP today. We also discussed the possibility of switching treatment every 3 weeks. Patient does not wish to do this at this time and wishes to continue in a dose dense fashion. He also received  OnPro Neulasta support. Return to clinic in 2 weeks for consideration of cycle 6. Plan to reimage after cycle 6.  2. Atrial fibrillation: Patient underwent RFA on December 21, 2016. He also had a negative nuclear medicine cardiac stress test on January 13, 2017. 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 Patient will require continued cardiac follow-up in [place]. A referral has been sent. 3. Pain: Improved. Continue oxycodone 10 mg every 6 hours as needed. 4. Constipation: Patient has been instructed to be more vigilant about a daily regimen using Colace twice a day and possibly even MiraLAX. He was also previously given a prescription for lactulose. 5. Nausea: Continue Zofran as prescribed. 6. Neutropenia: Resolved. Continue Neulasta as above. 7. Dysphagia: Monitor.  Patient expressed understanding and was in agreement with this plan. He also understands that He can call clinic at any time with any questions, concerns, or complaints.   Cancer Staging DLBCL (diffuse large B cell lymphoma) (HCC) Staging form: Hodgkin and Non-Hodgkin Lymphoma, AJCC 8th Edition - Clinical stage from 03/15/2017: Stage III (Diffuse large B-cell lymphoma) - Signed by Lloyd Huger, MD on 03/15/2017  Diffuse large B-cell lymphoma of lymph nodes of multiple regions [facility]) Staging form: Hodgkin and Non-Hodgkin Lymphoma, AJCC 8th Edition - Clinical stage from 03/17/2017: Stage III (Diffuse large B-cell lymphoma) - Signed by Lloyd Huger, MD on 03/17/2017   Lloyd Huger, MD   05/16/2017 8:00 AM

## 2017-05-13 ENCOUNTER — Inpatient Hospital Stay: Payer: 59

## 2017-05-13 ENCOUNTER — Inpatient Hospital Stay (HOSPITAL_BASED_OUTPATIENT_CLINIC_OR_DEPARTMENT_OTHER): Payer: 59 | Admitting: Oncology

## 2017-05-13 VITALS — BP 90/60 | HR 94 | Temp 97.2°F | Resp 16 | Wt 260.2 lb

## 2017-05-13 DIAGNOSIS — R110 Nausea: Secondary | ICD-10-CM | POA: Diagnosis not present

## 2017-05-13 DIAGNOSIS — C8338 Diffuse large B-cell lymphoma, lymph nodes of multiple sites: Secondary | ICD-10-CM

## 2017-05-13 DIAGNOSIS — Z7901 Long term (current) use of anticoagulants: Secondary | ICD-10-CM

## 2017-05-13 DIAGNOSIS — I480 Paroxysmal atrial fibrillation: Secondary | ICD-10-CM

## 2017-05-13 DIAGNOSIS — Z79899 Other long term (current) drug therapy: Secondary | ICD-10-CM

## 2017-05-13 DIAGNOSIS — R1900 Intra-abdominal and pelvic swelling, mass and lump, unspecified site: Secondary | ICD-10-CM

## 2017-05-13 DIAGNOSIS — R1310 Dysphagia, unspecified: Secondary | ICD-10-CM

## 2017-05-13 DIAGNOSIS — K5900 Constipation, unspecified: Secondary | ICD-10-CM

## 2017-05-13 LAB — PROTIME-INR
INR: 2.59
PROTHROMBIN TIME: 28.3 s — AB (ref 11.4–15.2)

## 2017-05-13 LAB — CBC WITH DIFFERENTIAL/PLATELET
BASOS ABS: 0.1 10*3/uL (ref 0–0.1)
Basophils Relative: 1 %
Eosinophils Absolute: 0 10*3/uL (ref 0–0.7)
Eosinophils Relative: 0 %
HEMATOCRIT: 30.4 % — AB (ref 40.0–52.0)
Hemoglobin: 10.4 g/dL — ABNORMAL LOW (ref 13.0–18.0)
LYMPHS ABS: 0.6 10*3/uL — AB (ref 1.0–3.6)
LYMPHS PCT: 5 %
MCH: 29.1 pg (ref 26.0–34.0)
MCHC: 34.2 g/dL (ref 32.0–36.0)
MCV: 85 fL (ref 80.0–100.0)
MONO ABS: 0.6 10*3/uL (ref 0.2–1.0)
MONOS PCT: 5 %
NEUTROS ABS: 10.7 10*3/uL — AB (ref 1.4–6.5)
Neutrophils Relative %: 89 %
Platelets: 203 10*3/uL (ref 150–440)
RBC: 3.57 MIL/uL — ABNORMAL LOW (ref 4.40–5.90)
RDW: 17.1 % — AB (ref 11.5–14.5)
WBC: 12 10*3/uL — ABNORMAL HIGH (ref 3.8–10.6)

## 2017-05-13 LAB — COMPREHENSIVE METABOLIC PANEL
ALT: 14 U/L — ABNORMAL LOW (ref 17–63)
AST: 18 U/L (ref 15–41)
Albumin: 3.6 g/dL (ref 3.5–5.0)
Alkaline Phosphatase: 79 U/L (ref 38–126)
Anion gap: 4 — ABNORMAL LOW (ref 5–15)
BILIRUBIN TOTAL: 0.5 mg/dL (ref 0.3–1.2)
BUN: 14 mg/dL (ref 6–20)
CO2: 29 mmol/L (ref 22–32)
Calcium: 9.2 mg/dL (ref 8.9–10.3)
Chloride: 103 mmol/L (ref 101–111)
Creatinine, Ser: 0.93 mg/dL (ref 0.61–1.24)
Glucose, Bld: 127 mg/dL — ABNORMAL HIGH (ref 65–99)
POTASSIUM: 4.2 mmol/L (ref 3.5–5.1)
Sodium: 136 mmol/L (ref 135–145)
TOTAL PROTEIN: 6.3 g/dL — AB (ref 6.5–8.1)

## 2017-05-13 MED ORDER — ACETAMINOPHEN 325 MG PO TABS
650.0000 mg | ORAL_TABLET | Freq: Once | ORAL | Status: AC
  Administered 2017-05-13: 650 mg via ORAL
  Filled 2017-05-13: qty 2

## 2017-05-13 MED ORDER — DEXAMETHASONE SODIUM PHOSPHATE 100 MG/10ML IJ SOLN
20.0000 mg | Freq: Once | INTRAMUSCULAR | Status: AC
  Administered 2017-05-13: 20 mg via INTRAVENOUS
  Filled 2017-05-13: qty 2

## 2017-05-13 MED ORDER — PEGFILGRASTIM 6 MG/0.6ML ~~LOC~~ PSKT
6.0000 mg | PREFILLED_SYRINGE | Freq: Once | SUBCUTANEOUS | Status: AC
  Administered 2017-05-13: 6 mg via SUBCUTANEOUS
  Filled 2017-05-13: qty 0.6

## 2017-05-13 MED ORDER — VINCRISTINE SULFATE CHEMO INJECTION 1 MG/ML
2.0000 mg | Freq: Once | INTRAVENOUS | Status: AC
  Administered 2017-05-13: 2 mg via INTRAVENOUS
  Filled 2017-05-13: qty 2

## 2017-05-13 MED ORDER — SODIUM CHLORIDE 0.9 % IV SOLN
1000.0000 mg | Freq: Once | INTRAVENOUS | Status: AC
  Administered 2017-05-13: 1000 mg via INTRAVENOUS
  Filled 2017-05-13: qty 100

## 2017-05-13 MED ORDER — SODIUM CHLORIDE 0.9% FLUSH
10.0000 mL | Freq: Once | INTRAVENOUS | Status: AC
  Administered 2017-05-13: 10 mL via INTRAVENOUS
  Filled 2017-05-13: qty 10

## 2017-05-13 MED ORDER — HEPARIN SOD (PORK) LOCK FLUSH 100 UNIT/ML IV SOLN
500.0000 [IU] | Freq: Once | INTRAVENOUS | Status: AC
  Administered 2017-05-13: 500 [IU] via INTRAVENOUS
  Filled 2017-05-13: qty 5

## 2017-05-13 MED ORDER — SODIUM CHLORIDE 0.9 % IV SOLN
2000.0000 mg | Freq: Once | INTRAVENOUS | Status: AC
  Administered 2017-05-13: 2000 mg via INTRAVENOUS
  Filled 2017-05-13: qty 100

## 2017-05-13 MED ORDER — SODIUM CHLORIDE 0.9 % IV SOLN: 375.0000 mg/m2 | Freq: Once | INTRAVENOUS | Status: DC

## 2017-05-13 MED ORDER — DOXORUBICIN HCL CHEMO IV INJECTION 2 MG/ML
50.0000 mg/m2 | Freq: Once | INTRAVENOUS | Status: AC
  Administered 2017-05-13: 132 mg via INTRAVENOUS
  Filled 2017-05-13: qty 66

## 2017-05-13 MED ORDER — SODIUM CHLORIDE 0.9 % IV SOLN
Freq: Once | INTRAVENOUS | Status: AC
  Administered 2017-05-13: 10:00:00 via INTRAVENOUS
  Filled 2017-05-13: qty 1000

## 2017-05-13 MED ORDER — PALONOSETRON HCL INJECTION 0.25 MG/5ML
0.2500 mg | Freq: Once | INTRAVENOUS | Status: AC
  Administered 2017-05-13: 0.25 mg via INTRAVENOUS
  Filled 2017-05-13: qty 5

## 2017-05-13 MED ORDER — DIPHENHYDRAMINE HCL 25 MG PO CAPS
50.0000 mg | ORAL_CAPSULE | Freq: Once | ORAL | Status: AC
  Administered 2017-05-13: 50 mg via ORAL
  Filled 2017-05-13: qty 2

## 2017-05-13 MED ORDER — SODIUM CHLORIDE 0.9 % IV SOLN
INTRAVENOUS | Status: DC
  Administered 2017-05-13: 10:00:00 via INTRAVENOUS
  Filled 2017-05-13 (×2): qty 1000

## 2017-05-13 MED ORDER — FAMOTIDINE IN NACL 20-0.9 MG/50ML-% IV SOLN
20.0000 mg | Freq: Two times a day (BID) | INTRAVENOUS | Status: DC
  Administered 2017-05-13: 20 mg via INTRAVENOUS
  Filled 2017-05-13: qty 50

## 2017-05-13 NOTE — Progress Notes (Signed)
Patient c/o trouble swallowing medication, some discomfort on left side of throat, started after last chemo treatment

## 2017-05-24 NOTE — Progress Notes (Signed)
[place]  [phone] [phone]  ID: ODA LANSDOWNE OB: Apr 13, 1951  MR#: [id number]  WSF#:[id number]  Patient Care Team: Valera Castle, MD as PCP - General  CHIEF COMPLAINT: Stage III DLBCL.  INTERVAL HISTORY: Patient returns to clinic today for further evaluation and consideration of cycle 6 of R-CHOP chemotherapy. He has mouth sores for several days after her treatments, but otherwise is tolerating them well. He does not complain of increased nausea or fatigue today. His appetite has improved. He does not complain of pain today. He continues to have problems with constipation. He denies any fevers or illnesses. He has no neurologic complaints. He denies any shortness of breath, cough, or hemoptysis. He has no urinary complaints. Patient offers no further specific complaints.  REVIEW OF SYSTEMS:   Review of Systems  Constitutional: Negative for fever, malaise/fatigue and weight loss.  Respiratory: Negative for cough, hemoptysis and shortness of breath.   Cardiovascular: Negative for chest pain and leg swelling.  Gastrointestinal: Positive for constipation. Negative for abdominal pain, blood in stool, diarrhea, melena, nausea and vomiting.  Genitourinary: Negative.   Musculoskeletal: Positive for back pain.  Neurological: Negative for sensory change and weakness.  Psychiatric/Behavioral: The patient is nervous/anxious and has insomnia.     As per HPI. Otherwise, a complete review of systems is negative.  PAST MEDICAL HISTORY: Past Medical History:  Diagnosis Date  . Arthritis    thumbs  . Asymptomatic varicose veins of bilateral lower extremities    Uncomfortable but in no ulcers  . Cancer ([place])   . Complication of anesthesia    slow to wake after hernia surg.  was taking "a bunch" of herbals at that time  . Coronary artery disease, non-occlusive July 2008   Nonobstructive by cath   . Family history of premature coronary  artery disease    Father  . GERD (gastroesophageal reflux disease)   . 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 Obstructive sleep apnea    severe per study 09/05/14   . Paroxysmal atrial fibrillation ([place]) 2002   a) Initially diagnosed as [place] Fib while in [place] (on ASA & BB after) - was on warfarin for a couple years and a Flecainide Pill-in Pocket PRN; b) recurrent A. fib with RVR August 2050 - status post cardioversion with flecainide, on low-dose beta blocker  . Pre-syncope May 2015   Unrevealing monitor in May  . Uses hearing aid    bilateral  . Wears dentures    partial upper    PAST SURGICAL HISTORY: Past Surgical History:  Procedure Laterality Date  . CARDIAC CATHETERIZATION  2008   Nonobstructive CAD  . CHOLECYSTECTOMY    . ELBOW SURGERY    . Event monitor  May 2015   Unrevealing  . HERNIA REPAIR    . KNEE ARTHROSCOPY Left 06/28/2015   Procedure: ARTHROSCOPY KNEE WITH SYNOVECTOMY;  Surgeon: Leanor Kail, MD;  Location: [facility];  Service: Orthopedics;  Laterality: Left;  CPAP  . [place] RESECTION     December 20 14th  . NM MYOVIEW LTD  June 28, 2014   [facility]: [facility] -- no evidence of ischemia or infarction. Normal EF ~58%  .  PORTA CATH INSERTION N/A 03/15/2017   Procedure: Glori Luis Cath Insertion;  Surgeon: Algernon Huxley, MD;  Location: [facility];  Service: Cardiovascular;  Laterality: N/A;  . RADIOFREQUENCY ABLATION of Ventricular Tachycardia  July 2000 the   RVOT VT ablation: [facility] [place]    . TRANSTHORACIC ECHOCARDIOGRAM  May 2015   [facility] - Dr. Sharyn Lull:  EF 55%, mild LVH; otherwise mostly normal    FAMILY HISTORY: Family History  Problem Relation Age of Onset  . Breast cancer Mother   . Bone cancer Mother   . COPD Father   . Arrhythmia Father   . Heart failure Father   . Heart attack Father     ADVANCED DIRECTIVES (Y/N):  N  HEALTH MAINTENANCE: Social History  Substance Use Topics  . Smoking status: Former Smoker    Quit date: 11/24/1999  . Smokeless tobacco: Never Used  . Alcohol use No     Colonoscopy:  PAP:  Bone density:  Lipid panel:  No Known Allergies  Current Outpatient Prescriptions  Medication Sig Dispense Refill  . allopurinol (ZYLOPRIM) 300 MG tablet Take 1 tablet (300 mg total) by mouth daily. 30 tablet 3  . lidocaine-prilocaine (EMLA) cream Apply to affected area once 30 g 3  . magic mouthwash w/lidocaine SOLN Take 5 mLs by mouth 4 (four) times daily. 480 mL 3  . metoprolol tartrate (LOPRESSOR) 25 MG tablet Take 25 mg by mouth 2 (two) times daily.  2  . Multiple Vitamin (MULTIVITAMIN) tablet Take 1 tablet by mouth daily.    Marland Kitchen omeprazole (PRILOSEC) 40 MG capsule Take 1 capsule (40 mg total) by mouth 2 (two) times daily. 180 capsule 1  . ondansetron (ZOFRAN) 8 MG tablet Take 1 tablet (8 mg total) by mouth 2 (two) times daily as needed for refractory nausea / vomiting. Start on day 3 after cyclophosphamide chemotherapy. 60 tablet 2  . Oxycodone HCl 10 MG TABS Take 1 tablet (10 mg total) by mouth every 6 (six) hours as needed. 90 tablet 0  . predniSONE (DELTASONE) 20 MG tablet Take 5 tablets (100 mg) daily on days 1 - 5 of chemo 50 tablet 2  . prochlorperazine (COMPAZINE) 10 MG tablet Take 1 tablet (10 mg total) by mouth every 6 (six) hours as needed (Nausea or vomiting). 60 tablet 6  . tamsulosin (FLOMAX) 0.4 MG CAPS capsule Take 0.4 mg by mouth daily. PM    . warfarin (COUMADIN) 5 MG tablet Take 5 mg by mouth daily. Mon and Fri 7.5 mg, all other days 5 mg     No current facility-administered medications for  this visit.    Facility-Administered Medications Ordered in Other Visits  Medication Dose Route Frequency Provider Last Rate Last Dose  . 0.9 %  sodium chloride infusion   Intravenous Once Lloyd Huger, MD      . acetaminophen (TYLENOL) tablet 650 mg  650 mg Oral Once Lloyd Huger, MD      . cyclophosphamide (CYTOXAN) 1,960 mg in sodium chloride 0.9 % 250 mL chemo infusion  750 mg/m2 (Treatment Plan Recorded) Intravenous Once Lloyd Huger, MD      . dexamethasone (DECADRON) 20 mg in sodium chloride 0.9 % 50 mL IVPB  20 mg Intravenous Once Lloyd Huger, MD      . diphenhydrAMINE (BENADRYL) capsule 50 mg  50 mg Oral Once Lloyd Huger, MD      . DOXOrubicin (  ADRIAMYCIN) chemo injection 132 mg  50 mg/m2 (Treatment Plan Recorded) Intravenous Once Lloyd Huger, MD      . famotidine (PEPCID) IVPB 20 mg premix  20 mg Intravenous Q12H Lloyd Huger, MD      . heparin lock flush 100 unit/mL  500 Units Intracatheter Once PRN Lloyd Huger, MD      . palonosetron (ALOXI) injection 0.25 mg  0.25 mg Intravenous Once Lloyd Huger, MD      . pegfilgrastim (NEULASTA ONPRO KIT) injection 6 mg  6 mg Subcutaneous Once Lloyd Huger, MD      . riTUXimab (RITUXAN) 1,000 mg in sodium chloride 0.9 % 250 mL (2.8571 mg/mL) chemo infusion  375 mg/m2 (Treatment Plan Recorded) Intravenous Once Lloyd Huger, MD      . sodium chloride flush (NS) 0.9 % injection 10 mL  10 mL Intracatheter PRN Lloyd Huger, MD      . vinCRIStine (ONCOVIN) 2 mg in sodium chloride 0.9 % 50 mL chemo infusion  2 mg Intravenous Once Lloyd Huger, MD        OBJECTIVE: Vitals:   05/27/17 0844  BP: 112/79  Pulse: 93  Resp: 20  Temp: (!) 97.2 F (36.2 C)     Body mass index is 31.15 kg/m.    ECOG FS:0 - Asymptomatic  General: Well-developed, well-nourished, no acute distress. Eyes: Pink conjunctiva, anicteric sclera. HEENT: Clear oropharynx without exudate  or erythema. Lungs: Clear to auscultation bilaterally. Heart: Regular rate and rhythm. No rubs, murmurs, or gallops. Abdomen: Soft, nontender, nondistended. No organomegaly noted, normoactive bowel sounds. Musculoskeletal: No edema, cyanosis, or clubbing. Neuro: Alert, answering all questions appropriately. Cranial nerves grossly intact. Skin: No rashes or petechiae noted. Psych: Normal affect. Lymphatics: No cervical, calvicular, axillary or inguinal LAD.   LAB RESULTS:  Lab Results  Component Value Date   NA 140 05/27/2017   K 4.2 05/27/2017   CL 105 05/27/2017   CO2 29 05/27/2017   GLUCOSE 122 (H) 05/27/2017   BUN 11 05/27/2017   CREATININE 0.90 05/27/2017   CALCIUM 9.2 05/27/2017   PROT 6.4 (L) 05/27/2017   ALBUMIN 3.4 (L) 05/27/2017   AST 21 05/27/2017   ALT 14 (L) 05/27/2017   ALKPHOS 85 05/27/2017   BILITOT 0.4 05/27/2017   GFRNONAA >60 05/27/2017   GFRAA >60 05/27/2017    Lab Results  Component Value Date   WBC 8.7 05/27/2017   NEUTROABS 7.8 (H) 05/27/2017   HGB 10.2 (L) 05/27/2017   HCT 29.8 (L) 05/27/2017   MCV 87.5 05/27/2017   PLT 190 05/27/2017     STUDIES: No results found.  ASSESSMENT: Stage III DLBCL.  PLAN:    1. DLBCL: Bone marrow biopsy was negative for lymphoma. Pretreatment MUGA scan on March 16, 2017 reported an EF of 66% repeat in July 2018. Proceed with cycle 6 of dose dense R-CHOP today. We also discussed the possibility of switching treatment every 3 weeks. Patient does not wish to do this at this time and wishes to continue in a dose dense fashion. He also received OnPro Neulasta support. Will reimage with PET scan prior to cycle 7. If patient has a complete response, will give 2 additional treatments. If he has only isolated disease remaining, can consider XRT. Return to clinic in 2 weeks for consideration of cycle 7.  2. Atrial fibrillation: Patient underwent RFA on December 21, 2016. He also had a negative nuclear medicine cardiac  stress test on January 13, 2017. 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 Patient will require continued cardiac follow-up in [place]. A referral has been sent. We will draw INRs in the [place] during treatment, but wants treatment has been discontinued this laboratory work and be drawn by his cardiologist or primary care. 3. Pain: Improved. Continue oxycodone 10 mg every 6 hours as needed. 4. Constipation: Patient has been instructed to be more vigilant about a daily regimen using Colace twice a day and possibly even MiraLAX. He was also previously given a prescription for lactulose. 5. Nausea: Continue Zofran as prescribed. 6. Neutropenia: Resolved. Continue Neulasta as above. 7. Mouth sores: Continue Magic mouthwash as prescribed.   Patient expressed understanding and was in agreement with this plan. He also understands that He can call clinic at any time with any questions, concerns, or complaints.   Cancer Staging DLBCL (diffuse large B cell lymphoma) (HCC) Staging form: Hodgkin and Non-Hodgkin Lymphoma, AJCC 8th Edition - Clinical stage from 03/15/2017: Stage III (Diffuse large B-cell lymphoma) - Signed by Lloyd Huger, MD on 03/15/2017  Diffuse large B-cell lymphoma of lymph nodes of multiple regions [facility]) Staging form: Hodgkin and Non-Hodgkin Lymphoma, AJCC 8th Edition - Clinical stage from 03/17/2017: Stage III (Diffuse large B-cell lymphoma) - Signed by Lloyd Huger, MD on 03/17/2017   Lloyd Huger, MD   05/27/2017 9:17 AM

## 2017-05-27 ENCOUNTER — Inpatient Hospital Stay: Payer: 59 | Attending: Oncology | Admitting: Oncology

## 2017-05-27 ENCOUNTER — Inpatient Hospital Stay: Payer: 59

## 2017-05-27 VITALS — BP 112/79 | HR 93 | Temp 97.2°F | Resp 20 | Wt 262.7 lb

## 2017-05-27 DIAGNOSIS — K219 Gastro-esophageal reflux disease without esophagitis: Secondary | ICD-10-CM | POA: Insufficient documentation

## 2017-05-27 DIAGNOSIS — Z5111 Encounter for antineoplastic chemotherapy: Secondary | ICD-10-CM | POA: Insufficient documentation

## 2017-05-27 DIAGNOSIS — Z7901 Long term (current) use of anticoagulants: Secondary | ICD-10-CM | POA: Diagnosis not present

## 2017-05-27 DIAGNOSIS — Z79899 Other long term (current) drug therapy: Secondary | ICD-10-CM | POA: Diagnosis not present

## 2017-05-27 DIAGNOSIS — G4733 Obstructive sleep apnea (adult) (pediatric): Secondary | ICD-10-CM | POA: Insufficient documentation

## 2017-05-27 DIAGNOSIS — R110 Nausea: Secondary | ICD-10-CM | POA: Diagnosis not present

## 2017-05-27 DIAGNOSIS — I480 Paroxysmal atrial fibrillation: Secondary | ICD-10-CM | POA: Insufficient documentation

## 2017-05-27 DIAGNOSIS — Z87891 Personal history of nicotine dependence: Secondary | ICD-10-CM | POA: Insufficient documentation

## 2017-05-27 DIAGNOSIS — K5900 Constipation, unspecified: Secondary | ICD-10-CM | POA: Insufficient documentation

## 2017-05-27 DIAGNOSIS — I2510 Atherosclerotic heart disease of native coronary artery without angina pectoris: Secondary | ICD-10-CM | POA: Insufficient documentation

## 2017-05-27 DIAGNOSIS — Z7689 Persons encountering health services in other specified circumstances: Secondary | ICD-10-CM | POA: Diagnosis not present

## 2017-05-27 DIAGNOSIS — C8338 Diffuse large B-cell lymphoma, lymph nodes of multiple sites: Secondary | ICD-10-CM

## 2017-05-27 LAB — CBC WITH DIFFERENTIAL/PLATELET
BASOS PCT: 1 %
Basophils Absolute: 0.1 10*3/uL (ref 0–0.1)
Eosinophils Absolute: 0 10*3/uL (ref 0–0.7)
Eosinophils Relative: 0 %
HEMATOCRIT: 29.8 % — AB (ref 40.0–52.0)
HEMOGLOBIN: 10.2 g/dL — AB (ref 13.0–18.0)
Lymphocytes Relative: 4 %
Lymphs Abs: 0.4 10*3/uL — ABNORMAL LOW (ref 1.0–3.6)
MCH: 29.9 pg (ref 26.0–34.0)
MCHC: 34.2 g/dL (ref 32.0–36.0)
MCV: 87.5 fL (ref 80.0–100.0)
MONOS PCT: 4 %
Monocytes Absolute: 0.4 10*3/uL (ref 0.2–1.0)
NEUTROS ABS: 7.8 10*3/uL — AB (ref 1.4–6.5)
NEUTROS PCT: 91 %
Platelets: 190 10*3/uL (ref 150–440)
RBC: 3.4 MIL/uL — ABNORMAL LOW (ref 4.40–5.90)
RDW: 19.6 % — ABNORMAL HIGH (ref 11.5–14.5)
WBC: 8.7 10*3/uL (ref 3.8–10.6)

## 2017-05-27 LAB — COMPREHENSIVE METABOLIC PANEL
ALBUMIN: 3.4 g/dL — AB (ref 3.5–5.0)
ALK PHOS: 85 U/L (ref 38–126)
ALT: 14 U/L — AB (ref 17–63)
ANION GAP: 6 (ref 5–15)
AST: 21 U/L (ref 15–41)
BILIRUBIN TOTAL: 0.4 mg/dL (ref 0.3–1.2)
BUN: 11 mg/dL (ref 6–20)
CALCIUM: 9.2 mg/dL (ref 8.9–10.3)
CO2: 29 mmol/L (ref 22–32)
CREATININE: 0.9 mg/dL (ref 0.61–1.24)
Chloride: 105 mmol/L (ref 101–111)
GFR calc Af Amer: >60 mL/min (ref >60)
GFR calc non Af Amer: >60 mL/min (ref >60)
GLUCOSE: 122 mg/dL — AB (ref 65–99)
Potassium: 4.2 mmol/L (ref 3.5–5.1)
Sodium: 140 mmol/L (ref 135–145)
TOTAL PROTEIN: 6.4 g/dL — AB (ref 6.5–8.1)

## 2017-05-27 LAB — PROTIME-INR
INR: 2.74
Prothrombin Time: 29.6 seconds — ABNORMAL HIGH (ref 11.4–15.2)

## 2017-05-27 MED ORDER — FAMOTIDINE IN NACL 20-0.9 MG/50ML-% IV SOLN
20.0000 mg | Freq: Two times a day (BID) | INTRAVENOUS | Status: DC
  Administered 2017-05-27: 20 mg via INTRAVENOUS

## 2017-05-27 MED ORDER — VINCRISTINE SULFATE CHEMO INJECTION 1 MG/ML
2.0000 mg | Freq: Once | INTRAVENOUS | Status: AC
  Administered 2017-05-27: 2 mg via INTRAVENOUS
  Filled 2017-05-27: qty 2

## 2017-05-27 MED ORDER — SODIUM CHLORIDE 0.9 % IV SOLN
20.0000 mg | Freq: Once | INTRAVENOUS | Status: AC
  Administered 2017-05-27: 20 mg via INTRAVENOUS
  Filled 2017-05-27: qty 2

## 2017-05-27 MED ORDER — SODIUM CHLORIDE 0.9% FLUSH
10.0000 mL | INTRAVENOUS | Status: DC | PRN
  Administered 2017-05-27: 10 mL
  Filled 2017-05-27: qty 10

## 2017-05-27 MED ORDER — SODIUM CHLORIDE 0.9 % IV SOLN: 375.0000 mg/m2 | Freq: Once | INTRAVENOUS | Status: DC

## 2017-05-27 MED ORDER — DOXORUBICIN HCL CHEMO IV INJECTION 2 MG/ML
50.0000 mg/m2 | Freq: Once | INTRAVENOUS | Status: AC
  Administered 2017-05-27: 132 mg via INTRAVENOUS
  Filled 2017-05-27: qty 66

## 2017-05-27 MED ORDER — PEGFILGRASTIM 6 MG/0.6ML ~~LOC~~ PSKT
6.0000 mg | PREFILLED_SYRINGE | Freq: Once | SUBCUTANEOUS | Status: AC
  Administered 2017-05-27: 6 mg via SUBCUTANEOUS

## 2017-05-27 MED ORDER — CYCLOBENZAPRINE HCL 5 MG PO TABS: 5.0000 mg | ORAL_TABLET | Freq: Three times a day (TID) | ORAL | 0 refills | Status: DC | PRN

## 2017-05-27 MED ORDER — ACETAMINOPHEN 325 MG PO TABS
650.0000 mg | ORAL_TABLET | Freq: Once | ORAL | Status: AC
  Administered 2017-05-27: 650 mg via ORAL

## 2017-05-27 MED ORDER — PALONOSETRON HCL INJECTION 0.25 MG/5ML
0.2500 mg | Freq: Once | INTRAVENOUS | Status: AC
  Administered 2017-05-27: 0.25 mg via INTRAVENOUS

## 2017-05-27 MED ORDER — SODIUM CHLORIDE 0.9 % IV SOLN
Freq: Once | INTRAVENOUS | Status: AC
  Administered 2017-05-27: 09:00:00 via INTRAVENOUS
  Filled 2017-05-27: qty 1000

## 2017-05-27 MED ORDER — SODIUM CHLORIDE 0.9 % IV SOLN
375.0000 mg/m2 | Freq: Once | INTRAVENOUS | Status: AC
  Administered 2017-05-27: 1000 mg via INTRAVENOUS
  Filled 2017-05-27: qty 100

## 2017-05-27 MED ORDER — SODIUM CHLORIDE 0.9 % IV SOLN
2000.0000 mg | Freq: Once | INTRAVENOUS | Status: AC
  Administered 2017-05-27: 2000 mg via INTRAVENOUS
  Filled 2017-05-27: qty 100

## 2017-05-27 MED ORDER — DIPHENHYDRAMINE HCL 25 MG PO CAPS
50.0000 mg | ORAL_CAPSULE | Freq: Once | ORAL | Status: AC
  Administered 2017-05-27: 50 mg via ORAL

## 2017-05-27 MED ORDER — HEPARIN SOD (PORK) LOCK FLUSH 100 UNIT/ML IV SOLN
500.0000 [IU] | Freq: Once | INTRAVENOUS | Status: AC | PRN
  Administered 2017-05-27: 500 [IU]

## 2017-05-27 NOTE — Progress Notes (Signed)
Patient reports continued discomfort from mouth sores, has tried magic mouthwash.

## 2017-05-27 NOTE — Addendum Note (Signed)
Addended by: Luretha Murphy on: 05/27/2017 10:00 AM   Modules accepted: Orders

## 2017-06-01 ENCOUNTER — Ambulatory Visit: Payer: 59

## 2017-06-01 ENCOUNTER — Telehealth: Payer: Self-pay | Admitting: *Deleted

## 2017-06-01 ENCOUNTER — Ambulatory Visit
Admission: RE | Admit: 2017-06-01 | Discharge: 2017-06-01 | Disposition: A | Discharge status: Home / Self Care | Payer: 59 | Source: Ambulatory Visit | Attending: Oncology | Admitting: Oncology

## 2017-06-01 DIAGNOSIS — C8338 Diffuse large B-cell lymphoma, lymph nodes of multiple sites: Secondary | ICD-10-CM | POA: Diagnosis not present

## 2017-06-01 MED ORDER — TECHNETIUM TC 99M-LABELED RED BLOOD CELLS IV KIT
19.8000 | PACK | Freq: Once | INTRAVENOUS | Status: AC | PRN
  Administered 2017-06-01: 19.8 via INTRAVENOUS

## 2017-06-01 NOTE — Telephone Encounter (Signed)
Wife called asking why his test results are not showingt up in My CHart

## 2017-06-01 NOTE — Telephone Encounter (Signed)
We have no control over MyChart.  He had a MUGA earlier today the hasn't been resulted yet.

## 2017-06-07 ENCOUNTER — Other Ambulatory Visit: Payer: Self-pay

## 2017-06-07 ENCOUNTER — Ambulatory Visit
Admission: RE | Admit: 2017-06-07 | Discharge: 2017-06-07 | Disposition: A | Discharge status: Home / Self Care | Payer: 59 | Source: Ambulatory Visit | Attending: Oncology | Admitting: Oncology

## 2017-06-07 DIAGNOSIS — C8338 Diffuse large B-cell lymphoma, lymph nodes of multiple sites: Secondary | ICD-10-CM | POA: Insufficient documentation

## 2017-06-07 DIAGNOSIS — R1900 Intra-abdominal and pelvic swelling, mass and lump, unspecified site: Secondary | ICD-10-CM

## 2017-06-07 LAB — GLUCOSE, CAPILLARY: Glucose-Capillary: 102 mg/dL — ABNORMAL HIGH (ref 65–99)

## 2017-06-07 MED ORDER — OXYCODONE HCL 10 MG PO TABS: 10.0000 mg | ORAL_TABLET | Freq: Four times a day (QID) | ORAL | 0 refills | Status: DC | PRN

## 2017-06-07 MED ORDER — FLUDEOXYGLUCOSE F - 18 (FDG) INJECTION
12.1900 | Freq: Once | INTRAVENOUS | Status: AC | PRN
  Administered 2017-06-07: 12.19 via INTRAVENOUS

## 2017-06-09 NOTE — Progress Notes (Signed)
[name]  [phone] [phone]  ID: Nicholas Reynolds OB: 08/15/1951  MR#: [id number]  URK#:[id number]  Patient Care Team: Valera Castle, MD as PCP - General  CHIEF COMPLAINT: Stage III DLBCL.  INTERVAL HISTORY: Patient returns to clinic today for further evaluation, discussion of his imaging results, and consideration of cycle  7 of 8 of R-CHOP chemotherapy. He has mouth sores for several days after her treatments, but otherwise is tolerating them well. He does not complain of increased nausea or fatigue today. His dizziness has resolved since decreasing his metoprolol dose. His appetite has improved. He does not complain of pain today. He continues to have problems with constipation. He denies any fevers or illnesses. He has no neurologic complaints. He denies any shortness of breath, cough, or hemoptysis. He has no urinary complaints. Patient offers no further specific complaints.  REVIEW OF SYSTEMS:   Review of Systems  Constitutional: Negative for fever, malaise/fatigue and weight loss.  Respiratory: Negative for cough, hemoptysis and shortness of breath.   Cardiovascular: Negative for chest pain and leg swelling.  Gastrointestinal: Positive for constipation. Negative for abdominal pain, blood in stool, diarrhea, melena, nausea and vomiting.  Genitourinary: Negative.   Musculoskeletal: Positive for back pain.  Neurological: Negative for sensory change and weakness.  Psychiatric/Behavioral: The patient is nervous/anxious and has insomnia.     As per HPI. Otherwise, a complete review of systems is negative.  PAST MEDICAL HISTORY: Past Medical History:  Diagnosis Date  . Arthritis    thumbs  . Asymptomatic varicose veins of bilateral lower extremities    Uncomfortable but in no ulcers  . Cancer ([place])   . Complication of anesthesia    slow to wake after hernia surg.  was taking "a bunch" of herbals at that time  . Coronary  artery disease, non-occlusive July 2008   Nonobstructive by cath   . Family history of premature coronary artery disease    Father  . GERD (gastroesophageal reflux disease)   . 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 Obstructive sleep apnea    severe per study 09/05/14   . Paroxysmal atrial fibrillation ([place]) 2002   a) Initially diagnosed as [place] Fib while in [place] (on ASA & BB after) - was on warfarin for a couple years and a Flecainide Pill-in Pocket PRN; b) recurrent A. fib with RVR August 2050 - status post cardioversion with flecainide, on low-dose beta blocker  . Pre-syncope May 2015   Unrevealing monitor in May  . Uses hearing aid    bilateral  . Wears dentures    partial upper    PAST SURGICAL HISTORY: Past Surgical History:  Procedure Laterality Date  . CARDIAC CATHETERIZATION  2008   Nonobstructive CAD  . CHOLECYSTECTOMY    . ELBOW SURGERY    . Event monitor  May 2015   Unrevealing  . HERNIA REPAIR    . KNEE ARTHROSCOPY Left 06/28/2015   Procedure: ARTHROSCOPY KNEE WITH SYNOVECTOMY;  Surgeon: Leanor Kail, MD;  Location: [facility];  Service: Orthopedics;  Laterality: Left;  CPAP  . [place] RESECTION     December 20 14th  . NM MYOVIEW LTD  June 28, 2014  [facility]: Lexiscan Myoview -- no evidence of ischemia or infarction. Normal EF ~58%  . PORTA CATH INSERTION N/A 03/15/2017   Procedure: Porta Cath Insertion;  Surgeon: Jason S Dew, MD;  Location: [facility] INVASIVE CV LAB;  Service: Cardiovascular;  Laterality: N/A;  . RADIOFREQUENCY ABLATION of Ventricular Tachycardia  July 2000 the   RVOT VT ablation: [facility], [place],  [place]  . ROTATOR CUFF REPAIR    . TRANSTHORACIC ECHOCARDIOGRAM  May 2015   [facility] - Dr. Bernnan: EF 55%, mild LVH; otherwise mostly normal    FAMILY HISTORY: Family History  Problem Relation Age of Onset  . Breast cancer Mother   . Bone cancer Mother   . COPD Father   . Arrhythmia Father   . Heart failure Father   . Heart attack Father     ADVANCED DIRECTIVES (Y/N):  N  HEALTH MAINTENANCE: Social History  Substance Use Topics  . Smoking status: Former Smoker    Quit date: 11/24/1999  . Smokeless tobacco: Never Used  . Alcohol use No     Colonoscopy:  PAP:  Bone density:  Lipid panel:  No Known Allergies  Current Outpatient Prescriptions  Medication Sig Dispense Refill  . allopurinol (ZYLOPRIM) 300 MG tablet Take 1 tablet (300 mg total) by mouth daily. 30 tablet 3  . cyclobenzaprine (FLEXERIL) 5 MG tablet Take 1 tablet (5 mg total) by mouth 3 (three) times daily as needed for muscle spasms. 60 tablet 0  . lidocaine-prilocaine (EMLA) cream Apply to affected area once 30 g 3  . magic mouthwash w/lidocaine SOLN Take 5 mLs by mouth 4 (four) times daily. 480 mL 3  . metoprolol tartrate (LOPRESSOR) 25 MG tablet Take 25 mg by mouth 2 (two) times daily.  2  . Multiple Vitamin (MULTIVITAMIN) tablet Take 1 tablet by mouth daily.    . omeprazole (PRILOSEC) 40 MG capsule Take 1 capsule (40 mg total) by mouth 2 (two) times daily. 180 capsule 1  . ondansetron (ZOFRAN) 8 MG tablet Take 1 tablet (8 mg total) by mouth 2 (two) times daily as needed for refractory nausea / vomiting. Start on day 3 after cyclophosphamide chemotherapy. 60 tablet 2  . Oxycodone HCl 10 MG TABS Take 1 tablet (10 mg total) by mouth every 6 (six) hours as needed. 90 tablet 0  . predniSONE (DELTASONE) 20 MG tablet Take 5 tablets (100 mg) daily on days 1 - 5 of chemo 25 tablet 2  . prochlorperazine (COMPAZINE) 10 MG tablet Take 1 tablet (10 mg total) by mouth every 6 (six) hours as needed (Nausea or vomiting).  60 tablet 6  . tamsulosin (FLOMAX) 0.4 MG CAPS capsule Take 0.4 mg by mouth daily. PM    . warfarin (COUMADIN) 5 MG tablet Take 5 mg by mouth daily. Mon and Fri 7.5 mg, all other days 5 mg     No current facility-administered medications for this visit.     OBJECTIVE: Vitals:   06/10/17 0909  BP: 105/71  Pulse: 98  Resp: 18  Temp: (!) 97.3 F (36.3 C)     Body mass index is 31.14 kg/m.    ECOG FS:0 - Asymptomatic  General: Well-developed, well-nourished, no acute distress. Eyes: Pink conjunctiva, anicteric sclera. HEENT: Clear oropharynx without exudate or erythema. Lungs: Clear to auscultation bilaterally. Heart: Regular rate and rhythm. No rubs, murmurs, or gallops. Abdomen: Soft, nontender, nondistended. No organomegaly noted, normoactive bowel sounds. Musculoskeletal: No edema, cyanosis, or clubbing. Neuro: Alert, answering all questions   appropriately. Cranial nerves grossly intact. Skin: No rashes or petechiae noted. Psych: Normal affect. Lymphatics: No cervical, calvicular, axillary or inguinal LAD.   LAB RESULTS:  Lab Results  Component Value Date   NA 136 06/10/2017   K 4.0 06/10/2017   CL 104 06/10/2017   CO2 28 06/10/2017   GLUCOSE 123 (H) 06/10/2017   BUN 14 06/10/2017   CREATININE 0.91 06/10/2017   CALCIUM 9.2 06/10/2017   PROT 6.6 06/10/2017   ALBUMIN 3.5 06/10/2017   AST 25 06/10/2017   ALT 18 06/10/2017   ALKPHOS 85 06/10/2017   BILITOT 0.4 06/10/2017   GFRNONAA >60 06/10/2017   GFRAA >60 06/10/2017    Lab Results  Component Value Date   WBC 8.1 06/10/2017   NEUTROABS 7.4 (H) 06/10/2017   HGB 9.6 (L) 06/10/2017   HCT 28.0 (L) 06/10/2017   MCV 88.8 06/10/2017   PLT 220 06/10/2017     STUDIES: Nm Cardiac Muga Rest  Result Date: 06/02/2017 CLINICAL DATA:  Diffuse large B-cell lymphoma. Undergoing cardiotoxic chemotherapy. EXAM: NUCLEAR MEDICINE CARDIAC BLOOD POOL IMAGING (MUGA) TECHNIQUE: Cardiac multi-gated acquisition was performed at  rest following intravenous injection of Tc-77mlabeled red blood cells. RADIOPHARMACEUTICALS:  19.8 mCi Tc-976mDP in-vitro labeled red blood cells IV COMPARISON:  Prior study 03/16/2017 FINDINGS: Normal left ventricular wall motion. No akinetic or dyskinetic segments are identified. The gallbladder ejection fraction was calculated at 53%. It was 66% on the prior study. IMPRESSION: Normal left ventricular wall motion with ejection fraction calculated at 53%. It was 66% on the prior study from April. Electronically Signed   By: P.Marijo Sanes.D.   On: 06/02/2017 13:52   Nm Pet Image Restag (ps) Skull Base To Thigh  Result Date: 06/07/2017 CLINICAL DATA:  Subsequent treatment strategy for diffuse large B- cell lymphoma. EXAM: NUCLEAR MEDICINE PET SKULL BASE TO THIGH TECHNIQUE: 12.2 mCi F-18 FDG was injected intravenously. Full-ring PET imaging was performed from the skull base to thigh after the radiotracer. CT data was obtained and used for attenuation correction and anatomic localization. FASTING BLOOD GLUCOSE:  Value: 102 mg/dl COMPARISON:  03/01/2017 FINDINGS: NECK No hypermetabolic lymph nodes in the neck. Chronic maxillary and ethmoid sinusitis. CHEST No hypermetabolic mediastinal or hilar nodes. No suspicious pulmonary nodules on the CT scan. Previously there is right lower paraspinal soft tissue mass spanning multiple levels which demonstrate high metabolic activity up to SUV of 34.3, there is only some very subtle pleural thickening in this vicinity on today's exam without significant hypermetabolic activity compatible with marked improvement. Right Port-A-Cath tip: Right atrium. Mild cardiomegaly. Low-density blood pool. ABDOMEN/PELVIS Previous hypermetabolic periaortic adenopathy and prior hypermetabolic lymph nodes or tumor deposits along the mesentery and omentum have essentially resolved. For example, the previously hypermetabolic 2.3 cm in short axis left lower periaortic lymph node shown on 2  image 224/3 of the prior exam is not visible or measurable on today' s exam. No new hypermetabolic activity in the abdomen/pelvis. Postoperative findings of the stomach ; prior cholecystectomy. Aortoiliac atherosclerotic vascular disease. Normal splenic size and activity. SKELETON Diffuse accentuated marrow activity in the skeleton, probably a response to marrow stimulation such as granulocyte stimulation. Small lipoma of the right that vastus lateralis muscle is partially included on today' s exam. IMPRESSION: 1. Excellent response with resolution of the thoracic paraspinal, retroperitoneal, and omental/peritoneal hypermetabolic soft tissue lesions. No significant residual measurable disease, compatible with [place] 1. 2. Diffuse marrow activity, likely from marrow stimulation. 3. Other imaging findings of potential  clinical significance: Chronic maxillary and ethmoid sinusitis. Aortic Atherosclerosis (ICD10-I70.0). Mild cardiomegaly. Low-density blood pool suggests anemia. Electronically Signed   By: Walter  Liebkemann M.D.   On: 06/07/2017 14:42    ASSESSMENT: Stage III DLBCL.  PLAN:    1. DLBCL: Bone marrow biopsy was negative for lymphoma. Pretreatment MUGA scan on March 16, 2017 reported an EF of 66% repeat in July 2018. PET scan results from June 07, 2017 reviewed independently and reported as above with essentially complete resolution of disease. Will continue with 2 additional cycles of treatment and then discontinue. Proceed with cycle 7 of 8 of dose dense R-CHOP today. He also received OnPro Neulasta support.  Return to clinic in 2 weeks for consideration of cycle 8. Patient is moving back to [place] at the conclusion of his treatments, but wishes to continue his follow-up care in [place]. Return to clinic on August 30, 2017 with repeat imaging and evaluation on the same day.  2. Atrial fibrillation: Patient underwent RFA on December 21, 2016. He also had a negative nuclear medicine cardiac  stress test on January 13, 2017. 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 Patient will require continued cardiac follow-up in [place]. Once patient completes his treatment, his INR will need to be monitored by primary care or cardiology. 3. Pain: Improved. Continue oxycodone 10 mg every 6 hours as needed. 4. Constipation: Patient has been instructed to be more vigilant about a daily regimen using Colace twice a day and possibly even MiraLAX. He was also previously given a prescription for lactulose. 5. Nausea: Continue Zofran as prescribed. 6. Neutropenia: Resolved. Continue Neulasta as above. 7. Mouth sores: Continue Magic mouthwash as prescribed.   Patient expressed understanding and was in agreement with this plan. He also understands that He can call clinic at any time with any questions, concerns, or complaints.   Cancer Staging DLBCL (diffuse large B cell lymphoma) (HCC) Staging form: Hodgkin and Non-Hodgkin Lymphoma, AJCC 8th Edition - Clinical stage from 03/15/2017: Stage III (Diffuse large B-cell lymphoma) - Signed by Finnegan, Timothy J, MD on 03/15/2017  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HCC) Staging form: Hodgkin and Non-Hodgkin Lymphoma, AJCC 8th Edition - Clinical stage from 03/17/2017: Stage III (Diffuse large B-cell lymphoma) - Signed by Finnegan, Timothy J, MD on 03/17/2017   Timothy J Finnegan, MD   06/13/2017 6:45 PM     

## 2017-06-10 ENCOUNTER — Inpatient Hospital Stay (HOSPITAL_BASED_OUTPATIENT_CLINIC_OR_DEPARTMENT_OTHER): Payer: 59 | Admitting: Oncology

## 2017-06-10 ENCOUNTER — Inpatient Hospital Stay: Payer: 59

## 2017-06-10 ENCOUNTER — Encounter: Payer: Self-pay | Admitting: *Deleted

## 2017-06-10 ENCOUNTER — Encounter (INDEPENDENT_AMBULATORY_CARE_PROVIDER_SITE_OTHER): Payer: Self-pay

## 2017-06-10 VITALS — BP 105/71 | HR 98 | Temp 97.3°F | Resp 18 | Wt 262.6 lb

## 2017-06-10 DIAGNOSIS — K5900 Constipation, unspecified: Secondary | ICD-10-CM | POA: Diagnosis not present

## 2017-06-10 DIAGNOSIS — C8338 Diffuse large B-cell lymphoma, lymph nodes of multiple sites: Secondary | ICD-10-CM

## 2017-06-10 DIAGNOSIS — R110 Nausea: Secondary | ICD-10-CM | POA: Diagnosis not present

## 2017-06-10 DIAGNOSIS — Z79899 Other long term (current) drug therapy: Secondary | ICD-10-CM | POA: Diagnosis not present

## 2017-06-10 LAB — CBC WITH DIFFERENTIAL/PLATELET
BASOS ABS: 0.1 10*3/uL (ref 0–0.1)
BASOS PCT: 1 %
EOS ABS: 0 10*3/uL (ref 0–0.7)
Eosinophils Relative: 0 %
HCT: 28 % — ABNORMAL LOW (ref 40.0–52.0)
Hemoglobin: 9.6 g/dL — ABNORMAL LOW (ref 13.0–18.0)
Lymphocytes Relative: 4 %
Lymphs Abs: 0.3 10*3/uL — ABNORMAL LOW (ref 1.0–3.6)
MCH: 30.4 pg (ref 26.0–34.0)
MCHC: 34.3 g/dL (ref 32.0–36.0)
MCV: 88.8 fL (ref 80.0–100.0)
MONO ABS: 0.2 10*3/uL (ref 0.2–1.0)
MONOS PCT: 3 %
NEUTROS ABS: 7.4 10*3/uL — AB (ref 1.4–6.5)
NEUTROS PCT: 92 %
Platelets: 220 10*3/uL (ref 150–440)
RBC: 3.16 MIL/uL — ABNORMAL LOW (ref 4.40–5.90)
RDW: 21.1 % — AB (ref 11.5–14.5)
WBC: 8.1 10*3/uL (ref 3.8–10.6)

## 2017-06-10 LAB — COMPREHENSIVE METABOLIC PANEL
ALBUMIN: 3.5 g/dL (ref 3.5–5.0)
ALT: 18 U/L (ref 17–63)
ANION GAP: 4 — AB (ref 5–15)
AST: 25 U/L (ref 15–41)
Alkaline Phosphatase: 85 U/L (ref 38–126)
BUN: 14 mg/dL (ref 6–20)
CHLORIDE: 104 mmol/L (ref 101–111)
CO2: 28 mmol/L (ref 22–32)
Calcium: 9.2 mg/dL (ref 8.9–10.3)
Creatinine, Ser: 0.91 mg/dL (ref 0.61–1.24)
GFR calc Af Amer: >60 mL/min (ref >60)
GFR calc non Af Amer: >60 mL/min (ref >60)
GLUCOSE: 123 mg/dL — AB (ref 65–99)
POTASSIUM: 4 mmol/L (ref 3.5–5.1)
SODIUM: 136 mmol/L (ref 135–145)
Total Bilirubin: 0.4 mg/dL (ref 0.3–1.2)
Total Protein: 6.6 g/dL (ref 6.5–8.1)

## 2017-06-10 LAB — PROTIME-INR
INR: 2.42
Prothrombin Time: 26.8 seconds — ABNORMAL HIGH (ref 11.4–15.2)

## 2017-06-10 MED ORDER — SODIUM CHLORIDE 0.9 % IV SOLN: 375.0000 mg/m2 | Freq: Once | INTRAVENOUS | Status: DC

## 2017-06-10 MED ORDER — FAMOTIDINE IN NACL 20-0.9 MG/50ML-% IV SOLN
20.0000 mg | Freq: Two times a day (BID) | INTRAVENOUS | Status: DC
  Administered 2017-06-10: 20 mg via INTRAVENOUS
  Filled 2017-06-10: qty 50

## 2017-06-10 MED ORDER — RITUXIMAB CHEMO INJECTION 500 MG/50ML
375.0000 mg/m2 | Freq: Once | INTRAVENOUS | Status: AC
  Administered 2017-06-10: 1000 mg via INTRAVENOUS
  Filled 2017-06-10: qty 100

## 2017-06-10 MED ORDER — SODIUM CHLORIDE 0.9 % IV SOLN
20.0000 mg | Freq: Once | INTRAVENOUS | Status: AC
  Administered 2017-06-10: 20 mg via INTRAVENOUS
  Filled 2017-06-10: qty 2

## 2017-06-10 MED ORDER — DOXORUBICIN HCL CHEMO IV INJECTION 2 MG/ML
50.0000 mg/m2 | Freq: Once | INTRAVENOUS | Status: AC
  Administered 2017-06-10: 132 mg via INTRAVENOUS
  Filled 2017-06-10: qty 66

## 2017-06-10 MED ORDER — DIPHENHYDRAMINE HCL 25 MG PO CAPS
50.0000 mg | ORAL_CAPSULE | Freq: Once | ORAL | Status: AC
  Administered 2017-06-10: 50 mg via ORAL
  Filled 2017-06-10: qty 2

## 2017-06-10 MED ORDER — ACETAMINOPHEN 325 MG PO TABS
650.0000 mg | ORAL_TABLET | Freq: Once | ORAL | Status: AC
  Administered 2017-06-10: 650 mg via ORAL
  Filled 2017-06-10: qty 2

## 2017-06-10 MED ORDER — SODIUM CHLORIDE 0.9 % IV SOLN
Freq: Once | INTRAVENOUS | Status: AC
  Administered 2017-06-10: 11:00:00 via INTRAVENOUS
  Filled 2017-06-10: qty 1000

## 2017-06-10 MED ORDER — SODIUM CHLORIDE 0.9 % IV SOLN
Freq: Once | INTRAVENOUS | Status: AC
  Administered 2017-06-10: 10:00:00 via INTRAVENOUS
  Filled 2017-06-10: qty 1000

## 2017-06-10 MED ORDER — SODIUM CHLORIDE 0.9 % IV SOLN
2000.0000 mg | Freq: Once | INTRAVENOUS | Status: AC
  Administered 2017-06-10: 2000 mg via INTRAVENOUS
  Filled 2017-06-10: qty 100

## 2017-06-10 MED ORDER — PEGFILGRASTIM 6 MG/0.6ML ~~LOC~~ PSKT
6.0000 mg | PREFILLED_SYRINGE | Freq: Once | SUBCUTANEOUS | Status: AC
  Administered 2017-06-10: 6 mg via SUBCUTANEOUS
  Filled 2017-06-10: qty 0.6

## 2017-06-10 MED ORDER — PALONOSETRON HCL INJECTION 0.25 MG/5ML
0.2500 mg | Freq: Once | INTRAVENOUS | Status: AC
  Administered 2017-06-10: 0.25 mg via INTRAVENOUS
  Filled 2017-06-10: qty 5

## 2017-06-10 MED ORDER — PREDNISONE 20 MG PO TABS: ORAL_TABLET | ORAL | 2 refills | Status: DC

## 2017-06-10 MED ORDER — HEPARIN SOD (PORK) LOCK FLUSH 100 UNIT/ML IV SOLN
INTRAVENOUS | Status: AC
  Filled 2017-06-10: qty 5

## 2017-06-10 MED ORDER — VINCRISTINE SULFATE CHEMO INJECTION 1 MG/ML
2.0000 mg | Freq: Once | INTRAVENOUS | Status: AC
  Administered 2017-06-10: 2 mg via INTRAVENOUS
  Filled 2017-06-10: qty 2

## 2017-06-10 NOTE — Progress Notes (Signed)
Patient has occasional dizzy spells that has improved since reducing the Metoprolol dose.  He is needing a refill on the Prednisone.

## 2017-06-14 ENCOUNTER — Telehealth: Payer: Self-pay

## 2017-06-14 NOTE — Telephone Encounter (Signed)
I have called over to Dr. Bunnie Domino office to cancel port removal. Spoke with Otila Kluver and she is going to contact Mickel Baas to have the Aug 6 procedure canceled.

## 2017-06-14 NOTE — Telephone Encounter (Signed)
-----   Message from Elouise Munroe sent at 06/14/2017 10:57 AM EDT ----- Regarding: Canc Port Removal Contact: [phone] Please CANCEL the port removal- Wife just called and he does NOT want it removed-thx

## 2017-06-16 ENCOUNTER — Observation Stay
Admission: EM | Admit: 2017-06-16 | Discharge: 2017-06-17 | Disposition: A | Discharge status: Home / Self Care | Payer: 59 | Attending: Internal Medicine | Admitting: Internal Medicine

## 2017-06-16 ENCOUNTER — Other Ambulatory Visit: Payer: Self-pay

## 2017-06-16 ENCOUNTER — Emergency Department: Payer: 59

## 2017-06-16 ENCOUNTER — Telehealth: Payer: Self-pay | Admitting: *Deleted

## 2017-06-16 DIAGNOSIS — C8330 Diffuse large B-cell lymphoma, unspecified site: Secondary | ICD-10-CM | POA: Diagnosis not present

## 2017-06-16 DIAGNOSIS — D709 Neutropenia, unspecified: Secondary | ICD-10-CM | POA: Diagnosis not present

## 2017-06-16 DIAGNOSIS — I513 Intracardiac thrombosis, not elsewhere classified: Secondary | ICD-10-CM | POA: Diagnosis not present

## 2017-06-16 DIAGNOSIS — I8393 Asymptomatic varicose veins of bilateral lower extremities: Secondary | ICD-10-CM | POA: Insufficient documentation

## 2017-06-16 DIAGNOSIS — I959 Hypotension, unspecified: Principal | ICD-10-CM

## 2017-06-16 DIAGNOSIS — K623 Rectal prolapse: Secondary | ICD-10-CM

## 2017-06-16 DIAGNOSIS — M19041 Primary osteoarthritis, right hand: Secondary | ICD-10-CM | POA: Diagnosis not present

## 2017-06-16 DIAGNOSIS — E860 Dehydration: Secondary | ICD-10-CM | POA: Diagnosis not present

## 2017-06-16 DIAGNOSIS — I480 Paroxysmal atrial fibrillation: Secondary | ICD-10-CM | POA: Diagnosis not present

## 2017-06-16 DIAGNOSIS — K219 Gastro-esophageal reflux disease without esophagitis: Secondary | ICD-10-CM | POA: Insufficient documentation

## 2017-06-16 DIAGNOSIS — G4733 Obstructive sleep apnea (adult) (pediatric): Secondary | ICD-10-CM | POA: Insufficient documentation

## 2017-06-16 DIAGNOSIS — M19042 Primary osteoarthritis, left hand: Secondary | ICD-10-CM | POA: Diagnosis not present

## 2017-06-16 DIAGNOSIS — R109 Unspecified abdominal pain: Secondary | ICD-10-CM

## 2017-06-16 DIAGNOSIS — Z87891 Personal history of nicotine dependence: Secondary | ICD-10-CM | POA: Insufficient documentation

## 2017-06-16 DIAGNOSIS — I2510 Atherosclerotic heart disease of native coronary artery without angina pectoris: Secondary | ICD-10-CM | POA: Diagnosis not present

## 2017-06-16 DIAGNOSIS — Z791 Long term (current) use of non-steroidal anti-inflammatories (NSAID): Secondary | ICD-10-CM | POA: Diagnosis not present

## 2017-06-16 DIAGNOSIS — Z79899 Other long term (current) drug therapy: Secondary | ICD-10-CM | POA: Insufficient documentation

## 2017-06-16 DIAGNOSIS — D649 Anemia, unspecified: Secondary | ICD-10-CM | POA: Diagnosis not present

## 2017-06-16 LAB — COMPREHENSIVE METABOLIC PANEL
ALT: 16 U/L — ABNORMAL LOW (ref 17–63)
ANION GAP: 8 (ref 5–15)
AST: 18 U/L (ref 15–41)
Albumin: 3.5 g/dL (ref 3.5–5.0)
Alkaline Phosphatase: 112 U/L (ref 38–126)
BILIRUBIN TOTAL: 1 mg/dL (ref 0.3–1.2)
BUN: 23 mg/dL — ABNORMAL HIGH (ref 6–20)
CHLORIDE: 102 mmol/L (ref 101–111)
CO2: 27 mmol/L (ref 22–32)
Calcium: 8.9 mg/dL (ref 8.9–10.3)
Creatinine, Ser: 0.84 mg/dL (ref 0.61–1.24)
GFR calc Af Amer: >60 mL/min (ref >60)
Glucose, Bld: 99 mg/dL (ref 65–99)
POTASSIUM: 4.1 mmol/L (ref 3.5–5.1)
Sodium: 137 mmol/L (ref 135–145)
Total Protein: 6.1 g/dL — ABNORMAL LOW (ref 6.5–8.1)

## 2017-06-16 LAB — CBC
HEMATOCRIT: 26.6 % — AB (ref 40.0–52.0)
HEMOGLOBIN: 9 g/dL — AB (ref 13.0–18.0)
MCH: 30.3 pg (ref 26.0–34.0)
MCHC: 34 g/dL (ref 32.0–36.0)
MCV: 89.3 fL (ref 80.0–100.0)
Platelets: 210 10*3/uL (ref 150–440)
RBC: 2.98 MIL/uL — AB (ref 4.40–5.90)
RDW: 21.2 % — ABNORMAL HIGH (ref 11.5–14.5)
WBC: 4.3 10*3/uL (ref 3.8–10.6)

## 2017-06-16 LAB — PROTIME-INR
INR: 2.42
Prothrombin Time: 26.8 seconds — ABNORMAL HIGH (ref 11.4–15.2)

## 2017-06-16 LAB — TROPONIN I: Troponin I: <0.03 ng/mL (ref <0.03)

## 2017-06-16 LAB — URINALYSIS, COMPLETE (UACMP) WITH MICROSCOPIC
BACTERIA UA: NONE SEEN
BILIRUBIN URINE: NEGATIVE
Glucose, UA: NEGATIVE mg/dL
HGB URINE DIPSTICK: NEGATIVE
KETONES UR: NEGATIVE mg/dL
LEUKOCYTES UA: NEGATIVE
NITRITE: NEGATIVE
PROTEIN: NEGATIVE mg/dL
RBC / HPF: NONE SEEN RBC/hpf (ref 0–5)
SPECIFIC GRAVITY, URINE: 1.011 (ref 1.005–1.030)
Squamous Epithelial / LPF: NONE SEEN
pH: 7 (ref 5.0–8.0)

## 2017-06-16 LAB — LIPASE, BLOOD: LIPASE: 24 U/L (ref 11–51)

## 2017-06-16 MED ORDER — ADULT MULTIVITAMIN W/MINERALS CH
1.0000 | ORAL_TABLET | Freq: Every day | ORAL | Status: DC
  Administered 2017-06-17: 10:00:00 1 via ORAL
  Filled 2017-06-16: qty 1

## 2017-06-16 MED ORDER — ACETAMINOPHEN 650 MG RE SUPP: 650.0000 mg | Freq: Four times a day (QID) | RECTAL | Status: DC | PRN

## 2017-06-16 MED ORDER — MORPHINE SULFATE (PF) 2 MG/ML IV SOLN: 2.0000 mg | INTRAVENOUS | Status: DC | PRN

## 2017-06-16 MED ORDER — SODIUM CHLORIDE 0.9 % IV BOLUS (SEPSIS)
500.0000 mL | Freq: Once | INTRAVENOUS | Status: AC
  Administered 2017-06-16: 500 mL via INTRAVENOUS

## 2017-06-16 MED ORDER — ONDANSETRON HCL 4 MG PO TABS: 4.0000 mg | ORAL_TABLET | Freq: Four times a day (QID) | ORAL | Status: DC | PRN

## 2017-06-16 MED ORDER — ALLOPURINOL 300 MG PO TABS
300.0000 mg | ORAL_TABLET | Freq: Every day | ORAL | Status: DC
  Administered 2017-06-17: 10:00:00 300 mg via ORAL
  Filled 2017-06-16: qty 1

## 2017-06-16 MED ORDER — PANTOPRAZOLE SODIUM 40 MG PO TBEC
40.0000 mg | DELAYED_RELEASE_TABLET | Freq: Two times a day (BID) | ORAL | Status: DC
  Administered 2017-06-16 – 2017-06-17 (×2): 40 mg via ORAL
  Filled 2017-06-16 (×2): qty 1

## 2017-06-16 MED ORDER — TAMSULOSIN HCL 0.4 MG PO CAPS
0.4000 mg | ORAL_CAPSULE | Freq: Every day | ORAL | Status: DC
  Administered 2017-06-16: 0.4 mg via ORAL
  Filled 2017-06-16 (×2): qty 1

## 2017-06-16 MED ORDER — ACETAMINOPHEN 325 MG PO TABS: 650.0000 mg | ORAL_TABLET | Freq: Four times a day (QID) | ORAL | Status: DC | PRN

## 2017-06-16 MED ORDER — CYCLOBENZAPRINE HCL 10 MG PO TABS: 5.0000 mg | ORAL_TABLET | Freq: Three times a day (TID) | ORAL | Status: DC | PRN

## 2017-06-16 MED ORDER — ONDANSETRON HCL 4 MG/2ML IJ SOLN
4.0000 mg | Freq: Four times a day (QID) | INTRAMUSCULAR | Status: DC | PRN
  Administered 2017-06-17: 13:00:00 4 mg via INTRAVENOUS
  Filled 2017-06-16: qty 2

## 2017-06-16 MED ORDER — OXYCODONE HCL 5 MG PO TABS
10.0000 mg | ORAL_TABLET | Freq: Four times a day (QID) | ORAL | Status: DC | PRN
  Administered 2017-06-17 (×2): 10 mg via ORAL
  Filled 2017-06-16 (×2): qty 2

## 2017-06-16 MED ORDER — SODIUM CHLORIDE 0.9 % IV BOLUS (SEPSIS)
1000.0000 mL | Freq: Once | INTRAVENOUS | Status: AC
  Administered 2017-06-16: 1000 mL via INTRAVENOUS

## 2017-06-16 MED ORDER — ORAL CARE MOUTH RINSE
15.0000 mL | Freq: Two times a day (BID) | OROMUCOSAL | Status: DC
  Administered 2017-06-17: 10:00:00 15 mL via OROMUCOSAL

## 2017-06-16 NOTE — ED Notes (Signed)
Report given to deidra RN

## 2017-06-16 NOTE — Telephone Encounter (Signed)
Wife called to report that he is complaining of constipation, but his stool is soft. She reports a "tissue protruding entire circumference of rectum which is squishy, not like a hemorrhoid" He is weak and dizzy; feels he is dehydrated too./ BP 91/51 HR 109. She decided she is going to take him to the ER after discussing with me

## 2017-06-16 NOTE — H&P (Addendum)
[facility] NAME: Nicholas Reynolds    MR#:  [id number]  DATE OF BIRTH:  1951/03/25  DATE OF ADMISSION:  06/16/2017  PRIMARY CARE PHYSICIAN: Valera Castle, MD   REQUESTING/REFERRING PHYSICIAN:   CHIEF COMPLAINT:   Chief Complaint  Patient presents with  . Hypotension  . Abdominal Pain  . Constipation    HISTORY OF PRESENT ILLNESS: Nicholas Reynolds  is a 66 y.o. male with a known history of Diffuse large B-cell lymphoma,  who is receiving R- CHOP chemotherapy, paroxysmal atrial fibrillation, nonsustained V. tach, status post ablation in the past, obstructive sleep apnea, constipation, neutropenia, who presented to the hospital with complaints of abdominal pain, rectal prolapse hypotension. According to the patient, he was doing well up until around 10:30 AM, when he started having severe lower abdominal cramping. He felt lightheaded, dizzy, had cold sweats, had palpitations, his blood pressure as well as heart rate was measured by his wife, who reported them to be 97/57 and 112, respectively, patient had few episodes of prolapsed rectum, he felt very weak. On arrival to emergency room, his blood pressure was only 67 systolic, he was given 1 L of IV fluids after which blood pressure improved. His hemoglobin level was found to be 9. Rectal exam revealed faintly positive stool, brown in color. He had CT of abdomen and pelvis as well as chest x-ray, all of them were unremarkable. Hospitalist services were contacted for admission. Patient reports intermittent abdominal pain, constipation, nausea post chemotherapy, but this was the first time he had a rectal prolapse, per his wife. Patient also admits of difficulty urinating, passing the urine, intermittent burping  PAST MEDICAL HISTORY:   Past Medical History:  Diagnosis Date  . Arthritis    thumbs  . Asymptomatic varicose veins of bilateral lower extremities    Uncomfortable but in no ulcers  . Cancer ([place])   . Complication of anesthesia    slow to wake after hernia surg.  was taking "a bunch" of herbals at that time  . Coronary artery disease, non-occlusive July 2008   Nonobstructive by cath   . Family history of premature coronary artery disease    Father  . GERD (gastroesophageal reflux disease)   . 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 Obstructive sleep apnea    severe per study 09/05/14   . Paroxysmal atrial fibrillation ([place]) 2002   a) Initially diagnosed as [place] Fib while in [place] (on ASA & BB after) - was on warfarin for a couple years and a Flecainide Pill-in Pocket PRN; b) recurrent A. fib with RVR August 2050 - status post cardioversion with flecainide, on low-dose beta blocker  . Pre-syncope May 2015   Unrevealing monitor in May  . Uses hearing aid    bilateral  . Wears dentures    partial upper    PAST SURGICAL HISTORY: Past Surgical History:  Procedure Laterality Date  . CARDIAC CATHETERIZATION  2008   Nonobstructive CAD  . CHOLECYSTECTOMY    . ELBOW SURGERY    . Event monitor  May 2015   Unrevealing  . HERNIA REPAIR    . KNEE ARTHROSCOPY Left 06/28/2015  Procedure: ARTHROSCOPY KNEE WITH SYNOVECTOMY;  Surgeon: Leanor Kail, MD;  Location: [facility];  Service: Orthopedics;  Laterality: Left;  CPAP  . [place] RESECTION     December 20 14th  . NM MYOVIEW LTD  June 28, 2014   [facility]: [facility] -- no evidence of ischemia or infarction. Normal EF ~58%  . PORTA CATH INSERTION N/A 03/15/2017   Procedure: Glori Luis Cath Insertion;  Surgeon: Algernon Huxley, MD;  Location: [facility];  Service: Cardiovascular;  Laterality: N/A;  . RADIOFREQUENCY ABLATION of Ventricular Tachycardia  July 2000 the   RVOT VT ablation: [facility], [place]    . TRANSTHORACIC ECHOCARDIOGRAM  May 2015   [facility] - Dr. Sharyn Lull: EF 55%, mild LVH; otherwise mostly normal    SOCIAL HISTORY:  Social History  Substance Use Topics  . Smoking status: Former Smoker    Quit date: 11/24/1999  . Smokeless tobacco: Never Used  . Alcohol use No    FAMILY HISTORY:  Family History  Problem Relation Age of Onset  . Breast cancer Mother   . Bone cancer Mother   . COPD Father   . Arrhythmia Father   . Heart failure Father   . Heart attack Father     DRUG ALLERGIES: No Known Allergies  Review of Systems  Constitutional: Positive for chills, malaise/fatigue and weight loss. Negative for fever.  HENT: Positive for hearing loss. Negative for congestion.   Eyes: Negative for blurred vision and double vision.  Respiratory: Negative for cough, sputum production, shortness of breath and wheezing.   Cardiovascular: Positive for palpitations. Negative for chest pain, orthopnea, leg swelling and PND.  Gastrointestinal: Positive for abdominal pain and nausea. Negative for blood in stool, constipation, diarrhea and vomiting.  Genitourinary: Negative for dysuria, frequency, hematuria and urgency.  Musculoskeletal: Negative for falls.  Neurological: Positive for dizziness. Negative for tremors, focal weakness and headaches.  Endo/Heme/Allergies: Does not bruise/bleed easily.  Psychiatric/Behavioral: Negative for depression. The patient does not have insomnia.     MEDICATIONS AT HOME:  Prior to Admission medications   Medication Sig Start Date End Date Taking? Authorizing Provider  allopurinol (ZYLOPRIM) 300 MG tablet Take 1 tablet (300 mg total) by mouth daily. 03/17/17  Yes Lloyd Huger, MD  cyclobenzaprine (FLEXERIL) 5 MG tablet Take 1 tablet (5 mg  total) by mouth 3 (three) times daily as needed for muscle spasms. 05/27/17  Yes Lloyd Huger, MD  lidocaine-prilocaine (EMLA) cream Apply to affected area once 03/17/17  Yes Finnegan, Kathlene November, MD  metoprolol tartrate (LOPRESSOR) 25 MG tablet Take 25 mg by mouth 2 (two) times daily. 04/21/17  Yes [provider]  Multiple Vitamin (MULTIVITAMIN) tablet Take 1 tablet by mouth daily.   Yes [provider]  omeprazole (PRILOSEC) 40 MG capsule Take 1 capsule (40 mg total) by mouth 2 (two) times daily. 03/29/17  Yes Lloyd Huger, MD  ondansetron (ZOFRAN) 8 MG tablet Take 1 tablet (8 mg total) by mouth 2 (two) times daily as needed for refractory nausea / vomiting. Start on day 3 after cyclophosphamide chemotherapy. 04/29/17  Yes Lloyd Huger, MD  Oxycodone HCl 10 MG TABS Take 1 tablet (10 mg total) by mouth every 6 (six) hours as needed. 06/07/17  Yes Lloyd Huger, MD  predniSONE (DELTASONE) 20 MG tablet Take 5 tablets (100 mg) daily on days 1 - 5 of chemo 06/10/17  Yes Finnegan,  Kathlene November, MD  tamsulosin (FLOMAX) 0.4 MG CAPS capsule Take 0.4 mg by mouth daily. PM   Yes [provider]  warfarin (COUMADIN) 5 MG tablet Take 5 mg by mouth daily. Mon and Fri 7.5 mg, all other days 5 mg   Yes [provider]  magic mouthwash w/lidocaine SOLN Take 5 mLs by mouth 4 (four) times daily. Patient not taking: Reported on 06/16/2017 04/13/17   Lloyd Huger, MD  prochlorperazine (COMPAZINE) 10 MG tablet Take 1 tablet (10 mg total) by mouth every 6 (six) hours as needed (Nausea or vomiting). Patient not taking: Reported on 06/16/2017 03/17/17   Lloyd Huger, MD      PHYSICAL EXAMINATION:   VITAL SIGNS: Blood pressure 112/68, pulse 87, temperature 99 F (37.2 C), temperature source Oral, resp. rate 20, height 6\' 5"  (1.956 m), weight 118.8 kg (262 lb), SpO2 99 %.  GENERAL:  66 y.o.-year-old patient lying in the bed with no acute distress.  EYES:  Pupils equal, round, reactive to light and accommodation. No scleral icterus. Extraocular muscles intact. Has difficulty hearing HEENT: Head atraumatic, normocephalic. Oropharynx and nasopharynx clear.  NECK:  Supple, no jugular venous distention. No thyroid enlargement, no tenderness.  LUNGS: Normal breath sounds bilaterally, no wheezing, rales,rhonchi or crepitation. No use of accessory muscles of respiration.  CARDIOVASCULAR: S1, S2 normal. No murmurs, rubs, or gallops.  ABDOMEN: Soft, mild discomfort in suprapubic area, no rebound or guarding, nondistended. Bowel sounds present. No organomegaly or mass.  EXTREMITIES: No pedal edema, cyanosis, or clubbing.  NEUROLOGIC: Cranial nerves II through XII are intact. Muscle strength 5/5 in all extremities. Sensation intact. Gait not checked.  PSYCHIATRIC: The patient is alert and oriented x 3.  SKIN: No obvious rash, lesion, or ulcer.   LABORATORY PANEL:   CBC  Recent Labs Lab 06/10/17 0835 06/16/17 1332  WBC 8.1 4.3  HGB 9.6* 9.0*  HCT 28.0* 26.6*  PLT 220 210  MCV 88.8 89.3  MCH 30.4 30.3  MCHC 34.3 34.0  RDW 21.1* 21.2*  LYMPHSABS 0.3*  --   MONOABS 0.2  --   EOSABS 0.0  --   BASOSABS 0.1  --    ------------------------------------------------------------------------------------------------------------------  Chemistries   Recent Labs Lab 06/16/17 1332  NA 137  K 4.1  CL 102  CO2 27  GLUCOSE 99  BUN 23*  CREATININE 0.84  CALCIUM 8.9  AST 18  ALT 16*  ALKPHOS 112  BILITOT 1.0   ------------------------------------------------------------------------------------------------------------------  Cardiac Enzymes  Recent Labs Lab 06/16/17 1332  TROPONINI <0.03   ------------------------------------------------------------------------------------------------------------------  RADIOLOGY: Dg Chest Port 1 View  Result Date: 06/16/2017 CLINICAL DATA:  Abdominal pain and constipation. EXAM: PORTABLE CHEST 1 VIEW  COMPARISON:  03/04/2017 FINDINGS: Heart size upper limits of normal. Mediastinal shadows are normal. Lungs are clear. The vascularity is normal. No effusions. Power port on the right with tip in the SVC 2 cm above the right atrium. IMPRESSION: No active disease.  Power port. Electronically Signed   By: Nelson Chimes M.D.   On: 06/16/2017 14:42   Ct Renal Stone Study  Result Date: 06/16/2017 CLINICAL DATA:  Generalized abdominal pain. Personal history of lymphoma. EXAM: CT ABDOMEN AND PELVIS WITHOUT CONTRAST TECHNIQUE: Multidetector CT imaging of the abdomen and pelvis was performed following the standard protocol without IV contrast. COMPARISON:  CT scan 06/07/2017. FINDINGS: Lower chest: Minimal airspace disease is noted posteriorly in the lingula. Chief the lung bases are otherwise clear. Heart size is normal. Low-density blood pool  is again seen. No significant pleural or pericardial effusion is present. Hepatobiliary: No focal liver abnormality is seen. Status post cholecystectomy. No biliary dilatation. Pancreas: Unremarkable. No pancreatic ductal dilatation or surrounding inflammatory changes. Spleen: Normal in size without focal abnormality. Adrenals/Urinary Tract: The adrenal glands are normal bilaterally. The kidneys are unremarkable. No focal stone or mass lesion is present. There is no hindfoot. The ureters and urinary bladder are within normal limits bilaterally. Stomach/Bowel: Postsurgical changes are again noted in the stomach. The duodenum is within normal limits. Small bowel is unremarkable. The appendix is visualized and normal. The ascending and proximal transverse colon are collapsed. There is no focal inflammation. The descending and sigmoid colon are within normal limits. Vascular/Lymphatic: Minimal atherosclerotic calcifications are present in the aorta without aneurysm. Reproductive: Prostate is unremarkable. Other: A superior midline ventral hernia contains fat without bowel. There may be  a prior repair. No other significant ventral hernia is present. Musculoskeletal: Endplate Schmorl's nodes are present. No focal lytic or blastic lesion is present otherwise. Vertebral body heights are maintained. Slight leftward curvature is present at L4-5. Bony pelvis is intact. The hips are within normal limits. IMPRESSION: 1. No acute or focal lesion to explain the patient's abdominal pain. 2. The bowel is within normal limits. There is no evidence for constipation. 3. Midline ventral hernia contains fat but no bowel or adhesion. 4. Atherosclerosis. 5. Previous stomach surgery. 6. Posterior lingular airspace disease likely reflects atelectasis. Electronically Signed   By: San Morelle M.D.   On: 06/16/2017 14:53    EKG: Orders placed or performed in visit on 06/16/17  . EKG 12-Lead   EKG in the emergency room revealed normal sinus rhythm at 90 bpm, low voltage QRS and extremity leads, no acute illnesses or changes   IMPRESSION AND PLAN:  Active Problems:   Hypotension   Rectal prolapse   Abdominal pain #1. Hypotension of unclear etiology, possibly vasovagal episode due to nausea, defecation, continue IV fluids, follow blood pressure readings closely, hold blood pressure medications. Get orthostatic vital signs, watch for fevers, initiate antibiotic therapy, if needed, get blood, urine cultures #2. Near-syncope, palpitations, likely related to hypotension, cannot rule out arrhythmia, as patient experienced palpitations and increased heart rate at home, we'll continue telemetry monitoring #3. Abdominal pain of unclear etiology, now resolved, CT scan of abdomen and pelvis without contrast were unremarkable, likely related to rectal prolapse, get gastroenterologist to see patient in consultation. Getting bladder scan, initiate PPI #4. Rectal prolapse, continue stool softeners, get gastroenterologist to see patient in consultation, now resolved,  #5. Anemia with  faintly Positive  Hemoccult, hold Coumadin for now, initiate patient on PPI  All the records are reviewed and case discussed with ED provider. Management plans discussed with the patient, family and they are in agreement.  CODE STATUS: Code Status History    This patient does not have a recorded code status. Please follow your organizational policy for patients in this situation.       TOTAL TIME TAKING CARE OF THIS PATIENT: 55 minutes.    Theodoro Grist M.D on 06/16/2017 at 4:30 PM  Between 7am to 6pm - Pager - [phone] After 6pm go to [email or web address] - password [email or web address] [facility]  Office  [phone]  CC: Primary care physician; Valera Castle, MD

## 2017-06-16 NOTE — ED Provider Notes (Addendum)
[facility] Emergency Department Provider Note  ____________________________________________   I have reviewed the triage vital signs and the nursing notes.   HISTORY  Chief Complaint Hypotension; Abdominal Pain; and Constipation    HPI Nicholas Reynolds is a 66 y.o. male with a history of non-Hodgkin's lymphoma has not had a bowel movement in several days, was having a very large bowel movement today, nonbloody, non-melanotic, but he became very lightheaded. His wife thought that he had some degree of rectal prolapse during the bowel movement but that is since gone away. He denies any ongoing abdominal pain to me. He states that he just feels lightheaded. Initial pressure was 67/43. He has only had a minimal amount to drink today. He was started on IV fluid by [facility] and had about 250 cc of fluid and his pressure was in the mid 90s. He denies any chest pain or shortness of breath. He denies fever he did have a slight low-grade temperature here. He has had no vomiting. He does have a history of a gastric sleeve surgery and prior morbidly obese. She reports as well as atrial fibrillation.  Past Medical History:  Diagnosis Date  . Arthritis    thumbs  . Asymptomatic varicose veins of bilateral lower extremities    Uncomfortable but in no ulcers  . Cancer ([place])   . Complication of anesthesia    slow to wake after hernia surg.  was taking "a bunch" of herbals at that time  . Coronary artery disease, non-occlusive July 2008   Nonobstructive by cath   . Family history of premature coronary artery disease    Father  . GERD (gastroesophageal reflux disease)   . 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 History of gastric restrictive surgery December 2040    Formerly morbidly obese; gastric sleeve  . Obstructive sleep apnea    severe per study 09/05/14   . Paroxysmal atrial fibrillation ([place]) 2002   a) Initially diagnosed as [place] Fib while in [place] (on ASA & BB after) - was on warfarin for a couple years and a Flecainide Pill-in Pocket PRN; b) recurrent A. fib with RVR August 2050 - status post cardioversion with flecainide, on low-dose beta blocker  . Pre-syncope May 2015   Unrevealing monitor in May  . Uses hearing aid    bilateral  . Wears dentures    partial upper    Patient Active Problem List   Diagnosis Date Noted  . Arthritis of knee 04/15/2017  . Goals of care, counseling/discussion 03/15/2017  . 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[place]) 03/15/2017  . Chronic back pain 03/09/2017  . Gastroesophageal reflux 03/09/2017  . Morbid obesity ([place]) 03/09/2017  . Peripheral vascular disease ([place]) 03/09/2017  . 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 Ventricular tachycardia ([place]) 03/09/2017  . DLBCL (diffuse large B cell lymphoma) ([place]) 03/08/2017  . 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 Atrial tachycardia ([place]) 09/09/2016  . Near syncope 09/09/2016  . Osteoarthritis of knee 08/11/2016  . Sleep apnea   . Family history of premature coronary artery disease   . Pigmented villonodular synovitis of left knee 07/09/2015  . Strain of left quadriceps tendon 04/18/2015  . Traumatic arthritis of knee, left 04/18/2015  . Pain in right knee 03/07/2015  . Obesity (BMI  30-39.9) 12/05/2014  . Orthostatic dizziness 12/05/2014  . Trigger finger of left thumb 09/06/2014  . Paroxysmal atrial fibrillation ([place]) 07/15/2014  . Varicose veins of lower extremity with edema 07/15/2014  . Fatigue 07/15/2014  . Hearing loss, sensorineural 04/26/2014  . Palpitations 04/06/2014  . 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 Benign non-nodular prostatic hyperplasia with lower urinary tract symptoms 03/10/2013  . Venous  insufficiency 11/17/2012  . 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 H/O ventricular tachycardia 05/24/2007  . Coronary artery disease, non-occlusive 05/24/2007    Past Surgical History:  Procedure Laterality Date  . CARDIAC CATHETERIZATION  2008   Nonobstructive CAD  . CHOLECYSTECTOMY    . ELBOW SURGERY    . Event monitor  May 2015   Unrevealing  . HERNIA REPAIR    . KNEE ARTHROSCOPY Left 06/28/2015   Procedure: ARTHROSCOPY KNEE WITH SYNOVECTOMY;  Surgeon: Leanor Kail, MD;  Location: [place];  Service: Orthopedics;  Laterality: Left;  CPAP  . [place] RESECTION     December 20 14th  . NM MYOVIEW LTD  June 28, 2014   [facility]: [facility] -- no evidence of ischemia or infarction. Normal EF ~58%  . PORTA CATH INSERTION N/A 03/15/2017   Procedure: Glori Luis Cath Insertion;  Surgeon: Algernon Huxley, MD;  Location: [place] CV [facility];  Service: Cardiovascular;  Laterality: N/A;  . RADIOFREQUENCY ABLATION of Ventricular Tachycardia  July 2000 the   RVOT VT ablation: [facility] [place]    . TRANSTHORACIC ECHOCARDIOGRAM  May 2015   [facility] - Dr. Sharyn Lull: EF 55%, mild LVH; otherwise mostly normal    Prior to Admission medications   Medication Sig Start Date End Date Taking? Authorizing Provider  allopurinol (ZYLOPRIM) 300 MG tablet Take 1 tablet (300 mg total) by mouth daily. 03/17/17  Yes Lloyd Huger, MD  cyclobenzaprine (FLEXERIL) 5 MG tablet Take 1 tablet (5 mg total) by mouth 3 (three) times daily as needed for muscle spasms. 05/27/17  Yes Lloyd Huger, MD  lidocaine-prilocaine (EMLA) cream Apply to affected area once 03/17/17  Yes Finnegan, Kathlene November, MD  metoprolol tartrate (LOPRESSOR) 25 MG tablet Take 25 mg by mouth 2 (two) times daily. 04/21/17  Yes [provider]  Multiple Vitamin (MULTIVITAMIN) tablet Take 1 tablet by mouth daily.   Yes [provider]  omeprazole (PRILOSEC) 40 MG  capsule Take 1 capsule (40 mg total) by mouth 2 (two) times daily. 03/29/17  Yes Lloyd Huger, MD  ondansetron (ZOFRAN) 8 MG tablet Take 1 tablet (8 mg total) by mouth 2 (two) times daily as needed for refractory nausea / vomiting. Start on day 3 after cyclophosphamide chemotherapy. 04/29/17  Yes Lloyd Huger, MD  Oxycodone HCl 10 MG TABS Take 1 tablet (10 mg total) by mouth every 6 (six) hours as needed. 06/07/17  Yes Lloyd Huger, MD  predniSONE (DELTASONE) 20 MG tablet Take 5 tablets (100 mg) daily on days 1 - 5 of chemo 06/10/17  Yes Lloyd Huger, MD  tamsulosin (FLOMAX) 0.4 MG CAPS capsule Take 0.4 mg by mouth daily. PM   Yes [provider]  warfarin (COUMADIN) 5 MG tablet Take 5 mg by mouth daily. Mon and Fri 7.5 mg, all other days 5 mg   Yes [provider]  magic mouthwash w/lidocaine SOLN Take 5 mLs by mouth 4 (four) times daily. Patient not taking: Reported on 06/16/2017 04/13/17   Grayland Ormond,  Kathlene November, MD  prochlorperazine (COMPAZINE) 10 MG tablet Take 1 tablet (10 mg total) by mouth every 6 (six) hours as needed (Nausea or vomiting). Patient not taking: Reported on 06/16/2017 03/17/17   Lloyd Huger, MD    Allergies Patient has no known allergies.  Family History  Problem Relation Age of Onset  . Breast cancer Mother   . Bone cancer Mother   . COPD Father   . Arrhythmia Father   . Heart failure Father   . Heart attack Father     Social History Social History  Substance Use Topics  . Smoking status: Former Smoker    Quit date: 11/24/1999  . Smokeless tobacco: Never Used  . Alcohol use No    Review of Systems Constitutional: No fever/chills Eyes: No visual changes. ENT: No sore throat. No stiff neck no neck pain Cardiovascular: Denies chest pain. Respiratory: Denies shortness of breath. Gastrointestinal:   See history of present illness Genitourinary: Negative for dysuria. Musculoskeletal: Negative lower extremity  swelling Skin: Negative for rash. Neurological: Negative for severe headaches, focal weakness or numbness.   ____________________________________________   PHYSICAL EXAM:  VITAL SIGNS: ED Triage Vitals  Enc Vitals Group     BP 06/16/17 1318 (!) 67/43     Pulse Rate 06/16/17 1318 (!) 103     Resp 06/16/17 1318 14     Temp 06/16/17 1318 99 F (37.2 C)     Temp Source 06/16/17 1318 Oral     SpO2 06/16/17 1318 100 %     Weight 06/16/17 1321 262 lb (118.8 kg)     Height 06/16/17 1321 6\' 5"  (1.956 m)     Head Circumference --      Peak Flow --      Pain Score 06/16/17 1324 8     Pain Loc --      Pain Edu? --      Excl. in [place]? --     Constitutional: Alert and oriented. Patient appears to be generally weak but not focally so. Eyes: Conjunctivae are normal Head: Atraumatic HEENT: No congestion/rhinnorhea. Mucous membranes are moist.  Oropharynx non-erythematous Neck:   Nontender with no meningismus, no masses, no stridor Cardiovascular: Normal rate, regular rhythm. Grossly normal heart sounds.  Good peripheral circulation. Respiratory: Normal respiratory effort.  No retractions. Lungs CTAB. Abdominal: Soft and nontender. No distention. No guarding no rebound Back:  There is no focal tenderness or step off.  there is no midline tenderness there are no lesions noted. there is no CVA tenderness Rectal exam: No evidence of prolapse Musculoskeletal: No lower extremity tenderness, no upper extremity tenderness. No joint effusions, no DVT signs strong distal pulses no edema Neurologic:  Normal speech and language. No gross focal neurologic deficits are appreciated.  Skin:  Skin is warm, dry and intact. No rash noted. Psychiatric: Mood and affect are normal. Speech and behavior are normal.  ____________________________________________   LABS (all labs ordered are listed, but only abnormal results are displayed)  Labs Reviewed  CBC - Abnormal; Notable for the following:       Result  Value   RBC 2.98 (*)    Hemoglobin 9.0 (*)    HCT 26.6 (*)    RDW 21.2 (*)    All other components within normal limits  LIPASE, BLOOD  COMPREHENSIVE METABOLIC PANEL  URINALYSIS, COMPLETE (UACMP) WITH MICROSCOPIC  TROPONIN I   ____________________________________________  EKG  I personally interpreted any EKGs ordered by me or triage Sinus rhythm at  90 bpm, normal axis, nonspecific ST changes, no acute ischemia ____________________________________________  RADIOLOGY  I reviewed any imaging ordered by me or triage that were performed during my shift and, if possible, patient and/or family made aware of any abnormal findings. ____________________________________________   PROCEDURES  Procedure(s) performed: None  Procedures  Critical Care performed: None  ____________________________________________   INITIAL IMPRESSION / ASSESSMENT AND PLAN / ED COURSE  Pertinent labs & imaging results that were available during my care of the patient were reviewed by me and considered in my medical decision making (see chart for details).  Patient here with hypotension and weakness. Giving him IV fluids and he is doing better. Abdomen is benign, we'll check and see if he has any rectal bleeding with rectal exam, he may require CT scan. He is on Coumadin according to notes, we will obtain INR. CBC is close to his baseline. He is on chemotherapy, however, there is no indication of high fever here. We will obtain blood cultures as depression nonetheless. Does not appear to be septic and he is responding well to fluids.  ----------------------------------------- 2:04 PM on 06/16/2017 -----------------------------------------  Pressure currently 112/68 which is patient's baseline per family   ----------------------------------------- 2:13 PM on 06/16/2017 -----------------------------------------  Rectal exam shows very faint guaiac positive stool no frank bleeding or  melena  ----------------------------------------- 3:34 PM on 06/16/2017 -----------------------------------------  D/w hospitalist service, they agree w/ mgt and will admit. No evidence of infection thus far, and we will defer abx. cx sent. Lactate pending for hospitalist. No cp no sob. Feels much better at this time after 1 l ivf.     ____________________________________________   FINAL CLINICAL IMPRESSION(S) / ED DIAGNOSES  Final diagnoses:  None      This chart was dictated using voice recognition software.  Despite best efforts to proofread,  errors can occur which can change meaning.      Schuyler Amor, MD 06/16/17 1402    Schuyler Amor, MD 06/16/17 1405    Schuyler Amor, MD 06/16/17 1414    Schuyler Amor, MD 06/16/17 1535

## 2017-06-16 NOTE — Telephone Encounter (Signed)
Ok, thank you for the heads up!

## 2017-06-16 NOTE — ED Notes (Addendum)
Pt to ed with c/o weakness, hypotension, from home with wife who report pt felt dizzy and weak this am at home.  Reports he was trying to have a BM this am and then became increasingly dizzy and weak.  Pt pale on arrival, hx of Grade B Non-Hodgkins lymphoma, placed on CM at this time, EKG done, dr Burlene Arnt at bedside.

## 2017-06-16 NOTE — ED Triage Notes (Signed)
Per pt wife, pt is c/o abd pain with constipation for the past few days, today the wife states she noticed some skin like tissue coming from the rectum. Pt has a hx of hogkins lymphoma

## 2017-06-16 NOTE — ED Notes (Signed)
Pt transported to room 122

## 2017-06-17 ENCOUNTER — Encounter: Payer: Self-pay | Admitting: Oncology

## 2017-06-17 ENCOUNTER — Ambulatory Visit: Payer: 59 | Admitting: Oncology

## 2017-06-17 DIAGNOSIS — Z7901 Long term (current) use of anticoagulants: Secondary | ICD-10-CM

## 2017-06-17 DIAGNOSIS — K5900 Constipation, unspecified: Secondary | ICD-10-CM | POA: Diagnosis not present

## 2017-06-17 DIAGNOSIS — D649 Anemia, unspecified: Secondary | ICD-10-CM | POA: Diagnosis not present

## 2017-06-17 DIAGNOSIS — Z87891 Personal history of nicotine dependence: Secondary | ICD-10-CM | POA: Diagnosis not present

## 2017-06-17 DIAGNOSIS — G4733 Obstructive sleep apnea (adult) (pediatric): Secondary | ICD-10-CM | POA: Diagnosis not present

## 2017-06-17 DIAGNOSIS — R110 Nausea: Secondary | ICD-10-CM | POA: Diagnosis not present

## 2017-06-17 DIAGNOSIS — Z79899 Other long term (current) drug therapy: Secondary | ICD-10-CM

## 2017-06-17 DIAGNOSIS — C8338 Diffuse large B-cell lymphoma, lymph nodes of multiple sites: Secondary | ICD-10-CM | POA: Diagnosis not present

## 2017-06-17 DIAGNOSIS — K623 Rectal prolapse: Secondary | ICD-10-CM

## 2017-06-17 DIAGNOSIS — E860 Dehydration: Secondary | ICD-10-CM | POA: Diagnosis not present

## 2017-06-17 DIAGNOSIS — D709 Neutropenia, unspecified: Secondary | ICD-10-CM | POA: Diagnosis not present

## 2017-06-17 DIAGNOSIS — I480 Paroxysmal atrial fibrillation: Secondary | ICD-10-CM | POA: Diagnosis not present

## 2017-06-17 LAB — CBC
HCT: 24.4 % — ABNORMAL LOW (ref 40.0–52.0)
HEMOGLOBIN: 8.2 g/dL — AB (ref 13.0–18.0)
MCH: 29.8 pg (ref 26.0–34.0)
MCHC: 33.6 g/dL (ref 32.0–36.0)
MCV: 88.9 fL (ref 80.0–100.0)
Platelets: 180 10*3/uL (ref 150–440)
RBC: 2.75 MIL/uL — AB (ref 4.40–5.90)
RDW: 20.5 % — ABNORMAL HIGH (ref 11.5–14.5)
WBC: 2.1 10*3/uL — ABNORMAL LOW (ref 3.8–10.6)

## 2017-06-17 LAB — BASIC METABOLIC PANEL
ANION GAP: 5 (ref 5–15)
BUN: 14 mg/dL (ref 6–20)
CALCIUM: 8.7 mg/dL — AB (ref 8.9–10.3)
CO2: 27 mmol/L (ref 22–32)
Chloride: 106 mmol/L (ref 101–111)
Creatinine, Ser: 0.7 mg/dL (ref 0.61–1.24)
Glucose, Bld: 107 mg/dL — ABNORMAL HIGH (ref 65–99)
Potassium: 4 mmol/L (ref 3.5–5.1)
SODIUM: 138 mmol/L (ref 135–145)

## 2017-06-17 LAB — LACTIC ACID, PLASMA: LACTIC ACID, VENOUS: 1.1 mmol/L (ref 0.5–1.9)

## 2017-06-17 LAB — TROPONIN I: TROPONIN I: 0.03 ng/mL — AB (ref <0.03)

## 2017-06-17 LAB — HIV ANTIBODY (ROUTINE TESTING W REFLEX): HIV SCREEN 4TH GENERATION: NONREACTIVE

## 2017-06-17 MED ORDER — HYDROCORTISONE ACETATE 25 MG RE SUPP
25.0000 mg | Freq: Two times a day (BID) | RECTAL | Status: DC
  Administered 2017-06-17: 10:00:00 25 mg via RECTAL
  Filled 2017-06-17 (×2): qty 1

## 2017-06-17 MED ORDER — SODIUM CHLORIDE 0.9 % IV SOLN
INTRAVENOUS | Status: DC
  Administered 2017-06-17: 10:00:00 via INTRAVENOUS

## 2017-06-17 MED ORDER — HYDROCORTISONE NA SUCCINATE PF 100 MG IJ SOLR
50.0000 mg | Freq: Once | INTRAMUSCULAR | Status: AC
  Administered 2017-06-17: 17:00:00 50 mg via INTRAVENOUS
  Filled 2017-06-17: qty 1

## 2017-06-17 MED ORDER — HYDROCORTISONE ACETATE 25 MG RE SUPP: 25.0000 mg | Freq: Two times a day (BID) | RECTAL | 1 refills | Status: DC

## 2017-06-17 MED ORDER — WARFARIN - PHYSICIAN DOSING INPATIENT: Freq: Every day | Status: DC

## 2017-06-17 MED ORDER — WARFARIN SODIUM 5 MG PO TABS
5.0000 mg | ORAL_TABLET | Freq: Every day | ORAL | Status: DC
  Filled 2017-06-17: qty 1

## 2017-06-17 MED ORDER — BOOST / RESOURCE BREEZE PO LIQD: 1.0000 | Freq: Four times a day (QID) | ORAL | Status: DC

## 2017-06-17 MED ORDER — TAMSULOSIN HCL 0.4 MG PO CAPS: 0.4000 mg | ORAL_CAPSULE | Freq: Every day | ORAL | Status: DC

## 2017-06-17 MED ORDER — METOPROLOL TARTRATE 25 MG PO TABS
12.5000 mg | ORAL_TABLET | Freq: Two times a day (BID) | ORAL | Status: DC
  Administered 2017-06-17: 12.5 mg via ORAL
  Filled 2017-06-17: qty 1

## 2017-06-17 MED ORDER — HYDROCORTISONE SOD SUCCINATE 100 MG PF FOR IT USE: 50.0000 mg | Freq: Once | INTRAMUSCULAR | Status: DC

## 2017-06-17 NOTE — Consult Note (Signed)
[facility]  [phone] [phone]  ID: Nicholas Reynolds OB: 1951-08-13  MR#: [id number]  GGE#:[id number]  Patient Care Team: Valera Castle, MD as PCP - General  CHIEF COMPLAINT: Diffuse large B-cell lymphoma, dehydration, rectal prolapse.  INTERVAL HISTORY: Patient is a 66 year old male is actively receiving chemotherapy for diffuse large B-cell lymphoma. He recently presented to the emergency room with complaints of abdominal and rectal pain, increased weakness and fatigue, and dizziness. He continues to feel weak and fatigued, but his symptoms have otherwise resolved. He is preparing for discharge. He has no neurologic complaints. He continues to have weakness and fatigue, but this is improved. He denies any fevers. He denies any chest pain or shortness of breath. He has no further nausea. He denies any vomiting, constipation, or diarrhea. He continues to have mild rectal pain. He has no urinary complaints. Patient offers no further specific complaints.  REVIEW OF SYSTEMS:   Review of Systems  Constitutional: Positive for malaise/fatigue. Negative for fever and weight loss.  Respiratory: Negative.  Negative for cough and shortness of breath.   Cardiovascular: Negative.  Negative for chest pain and leg swelling.  Gastrointestinal: Positive for abdominal pain. Negative for blood in stool, constipation, diarrhea, melena, nausea and vomiting.  Genitourinary: Negative.   Musculoskeletal: Negative.   Skin: Negative.  Negative for rash.  Neurological: Positive for weakness.  Psychiatric/Behavioral: Negative.  The patient is not nervous/anxious.     As per HPI. Otherwise, a complete review of systems is negative.  PAST MEDICAL HISTORY: Past Medical History:  Diagnosis Date  . Arthritis    thumbs  . Asymptomatic varicose veins of bilateral lower extremities    Uncomfortable but in no ulcers  . Cancer ([place])   . Complication of  anesthesia    slow to wake after hernia surg.  was taking "a bunch" of herbals at that time  . Coronary artery disease, non-occlusive July 2008   Nonobstructive by cath   . Family history of premature coronary artery disease    Father  . GERD (gastroesophageal reflux disease)   . 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 Obstructive sleep apnea    severe per study 09/05/14   . Paroxysmal atrial fibrillation ([place]) 2002   a) Initially diagnosed as [place] Fib while in [place] (on ASA & BB after) - was on warfarin for a couple years and a Flecainide Pill-in Pocket PRN; b) recurrent A. fib with RVR August 2050 - status post cardioversion with flecainide, on low-dose beta blocker  . Pre-syncope May 2015   Unrevealing monitor in May  . Uses hearing aid    bilateral  . Wears dentures    partial upper    PAST SURGICAL HISTORY: Past Surgical History:  Procedure Laterality Date  . CARDIAC CATHETERIZATION  2008   Nonobstructive CAD  . CHOLECYSTECTOMY    . ELBOW SURGERY    . Event monitor  May 2015   Unrevealing  . HERNIA REPAIR    . KNEE ARTHROSCOPY Left 06/28/2015   Procedure: ARTHROSCOPY KNEE WITH SYNOVECTOMY;  Surgeon: Leanor Kail, MD;  Location: [facility];  Service: Orthopedics;  Laterality: Left;  CPAP  . LAPAROSCOPIC GASTRIC SLEEVE RESECTION  December 20 14th  . NM MYOVIEW LTD  June 28, 2014   [facility]: [facility] -- no evidence of ischemia or infarction. Normal EF ~58%  . PORTA CATH INSERTION N/A 03/15/2017   Procedure: Glori Luis Cath Insertion;  Surgeon: Algernon Huxley, MD;  Location: [facility];  Service: Cardiovascular;  Laterality: N/A;  . RADIOFREQUENCY  ABLATION of Ventricular Tachycardia  July 2000 the   RVOT VT ablation: [facility], [place]    . TRANSTHORACIC ECHOCARDIOGRAM  May 2015   [facility] - Dr. Sharyn Lull: EF 55%, mild LVH; otherwise mostly normal    FAMILY HISTORY: Family History  Problem Relation Age of Onset  . Breast cancer Mother   . Bone cancer Mother   . COPD Father   . Arrhythmia Father   . Heart failure Father   . Heart attack Father     ADVANCED DIRECTIVES (Y/N):  @ADVDIR @  HEALTH MAINTENANCE: Social History  Substance Use Topics  . Smoking status: Former Smoker    Quit date: 11/24/1999  . Smokeless tobacco: Never Used  . Alcohol use No     Colonoscopy:  PAP:  Bone density:  Lipid panel:  No Known Allergies  No current facility-administered medications for this encounter.    Current Outpatient Prescriptions  Medication Sig Dispense Refill  . allopurinol (ZYLOPRIM) 300 MG tablet Take 1 tablet (300 mg total) by mouth daily. 30 tablet 3  . cyclobenzaprine (FLEXERIL) 5 MG tablet Take 1 tablet (5 mg total) by mouth 3 (three) times daily as needed for muscle spasms. 60 tablet 0  . lidocaine-prilocaine (EMLA) cream Apply to affected area once 30 g 3  . metoprolol tartrate (LOPRESSOR) 25 MG tablet Take 25 mg by mouth 2 (two) times daily.  2  . Multiple Vitamin (MULTIVITAMIN) tablet Take 1 tablet by mouth daily.    Marland Kitchen omeprazole (PRILOSEC) 40 MG capsule Take 1 capsule (40 mg total) by mouth 2 (two) times daily. 180 capsule 1  . ondansetron (ZOFRAN) 8 MG tablet Take 1 tablet (8 mg total) by mouth 2 (two) times daily as needed for refractory nausea / vomiting. Start on day 3 after cyclophosphamide chemotherapy. 60 tablet 2  . Oxycodone HCl 10 MG TABS Take 1 tablet (10 mg total) by mouth every 6 (six) hours as needed. 90 tablet 0  . tamsulosin (FLOMAX) 0.4 MG CAPS capsule Take 0.4 mg by mouth daily. PM    . warfarin (COUMADIN) 5 MG tablet Take 5 mg by mouth daily. Mon  and Fri 7.5 mg, all other days 5 mg    . hydrocortisone (ANUSOL-HC) 25 MG suppository Place 1 suppository (25 mg total) rectally 2 (two) times daily. 12 suppository 1    OBJECTIVE: Vitals:   06/17/17 0954 06/17/17 1354  BP: (!) 99/57 (!) 104/56  Pulse: (!) 101 (!) 106  Resp: 20 19  Temp: 98.7 F (37.1 C) 98.7 F (37.1 C)     Body mass index is 31.01 kg/m.    ECOG FS:1 - Symptomatic but completely ambulatory  General: Well-developed, well-nourished, no acute distress. Eyes: Pink conjunctiva, anicteric sclera. HEENT: Normocephalic, moist mucous membranes, clear oropharnyx. Lungs: Clear to auscultation bilaterally. Heart: Regular rate and rhythm. No rubs, murmurs, or gallops. Abdomen: Soft, nontender, nondistended. No organomegaly noted, normoactive bowel sounds. Musculoskeletal: No edema, cyanosis, or clubbing. Neuro: Alert, answering all questions appropriately. Cranial nerves grossly intact. Skin: No rashes or petechiae noted. Psych: Normal affect. Lymphatics: No cervical, calvicular,  axillary or inguinal LAD.   LAB RESULTS:  Lab Results  Component Value Date   NA 138 06/17/2017   K 4.0 06/17/2017   CL 106 06/17/2017   CO2 27 06/17/2017   GLUCOSE 107 (H) 06/17/2017   BUN 14 06/17/2017   CREATININE 0.70 06/17/2017   CALCIUM 8.7 (L) 06/17/2017   PROT 6.1 (L) 06/16/2017   ALBUMIN 3.5 06/16/2017   AST 18 06/16/2017   ALT 16 (L) 06/16/2017   ALKPHOS 112 06/16/2017   BILITOT 1.0 06/16/2017   GFRNONAA >60 06/17/2017   GFRAA >60 06/17/2017    Lab Results  Component Value Date   WBC 2.1 (L) 06/17/2017   NEUTROABS 7.4 (H) 06/10/2017   HGB 8.2 (L) 06/17/2017   HCT 24.4 (L) 06/17/2017   MCV 88.9 06/17/2017   PLT 180 06/17/2017     STUDIES: Nm Cardiac Muga Rest  Result Date: 06/02/2017 CLINICAL DATA:  Diffuse large B-cell lymphoma. Undergoing cardiotoxic chemotherapy. EXAM: NUCLEAR MEDICINE CARDIAC BLOOD POOL IMAGING (MUGA) TECHNIQUE: Cardiac multi-gated  acquisition was performed at rest following intravenous injection of Tc-61m labeled red blood cells. RADIOPHARMACEUTICALS:  19.8 mCi Tc-86m MDP in-vitro labeled red blood cells IV COMPARISON:  Prior study 03/16/2017 FINDINGS: Normal left ventricular wall motion. No akinetic or dyskinetic segments are identified. The gallbladder ejection fraction was calculated at 53%. It was 66% on the prior study. IMPRESSION: Normal left ventricular wall motion with ejection fraction calculated at 53%. It was 66% on the prior study from April. Electronically Signed   By: Marijo Sanes M.D.   On: 06/02/2017 13:52   Nm Pet Image Restag (ps) Skull Base To Thigh  Result Date: 06/07/2017 CLINICAL DATA:  Subsequent treatment strategy for diffuse large B- cell lymphoma. EXAM: NUCLEAR MEDICINE PET SKULL BASE TO THIGH TECHNIQUE: 12.2 mCi F-18 FDG was injected intravenously. Full-ring PET imaging was performed from the skull base to thigh after the radiotracer. CT data was obtained and used for attenuation correction and anatomic localization. FASTING BLOOD GLUCOSE:  Value: 102 mg/dl COMPARISON:  03/01/2017 FINDINGS: NECK No hypermetabolic lymph nodes in the neck. Chronic maxillary and ethmoid sinusitis. CHEST No hypermetabolic mediastinal or hilar nodes. No suspicious pulmonary nodules on the CT scan. Previously there is right lower paraspinal soft tissue mass spanning multiple levels which demonstrate high metabolic activity up to SUV of 34.3, there is only some very subtle pleural thickening in this vicinity on today's exam without significant hypermetabolic activity compatible with marked improvement. Right Port-A-Cath tip: Right atrium. Mild cardiomegaly. Low-density blood pool. ABDOMEN/PELVIS Previous hypermetabolic periaortic adenopathy and prior hypermetabolic lymph nodes or tumor deposits along the mesentery and omentum have essentially resolved. For example, the previously hypermetabolic 2.3 cm in short axis left lower  periaortic lymph node shown on 2 image 224/3 of the prior exam is not visible or measurable on today' s exam. No new hypermetabolic activity in the abdomen/pelvis. Postoperative findings of the stomach ; prior cholecystectomy. Aortoiliac atherosclerotic vascular disease. Normal splenic size and activity. SKELETON Diffuse accentuated marrow activity in the skeleton, probably a response to marrow stimulation such as granulocyte stimulation. Small lipoma of the right that vastus lateralis muscle is partially included on today' s exam. IMPRESSION: 1. Excellent response with resolution of the thoracic paraspinal, retroperitoneal, and omental/peritoneal hypermetabolic soft tissue lesions. No significant residual measurable disease, compatible with [place] 1. 2. Diffuse marrow activity, likely from marrow stimulation. 3. Other imaging findings of potential clinical significance: Chronic maxillary and ethmoid sinusitis. Aortic Atherosclerosis (ICD10-I70.0). Mild cardiomegaly. Low-density blood  pool suggests anemia. Electronically Signed   By: Van Clines M.D.   On: 06/07/2017 14:42   Dg Chest Port 1 View  Result Date: 06/16/2017 CLINICAL DATA:  Abdominal pain and constipation. EXAM: PORTABLE CHEST 1 VIEW COMPARISON:  03/04/2017 FINDINGS: Heart size upper limits of normal. Mediastinal shadows are normal. Lungs are clear. The vascularity is normal. No effusions. Power port on the right with tip in the SVC 2 cm above the right atrium. IMPRESSION: No active disease.  Power port. Electronically Signed   By: Nelson Chimes M.D.   On: 06/16/2017 14:42   Ct Renal Stone Study  Result Date: 06/16/2017 CLINICAL DATA:  Generalized abdominal pain. Personal history of lymphoma. EXAM: CT ABDOMEN AND PELVIS WITHOUT CONTRAST TECHNIQUE: Multidetector CT imaging of the abdomen and pelvis was performed following the standard protocol without IV contrast. COMPARISON:  CT scan 06/07/2017. FINDINGS: Lower chest: Minimal airspace  disease is noted posteriorly in the lingula. Chief the lung bases are otherwise clear. Heart size is normal. Low-density blood pool is again seen. No significant pleural or pericardial effusion is present. Hepatobiliary: No focal liver abnormality is seen. Status post cholecystectomy. No biliary dilatation. Pancreas: Unremarkable. No pancreatic ductal dilatation or surrounding inflammatory changes. Spleen: Normal in size without focal abnormality. Adrenals/Urinary Tract: The adrenal glands are normal bilaterally. The kidneys are unremarkable. No focal stone or mass lesion is present. There is no hindfoot. The ureters and urinary bladder are within normal limits bilaterally. Stomach/Bowel: Postsurgical changes are again noted in the stomach. The duodenum is within normal limits. Small bowel is unremarkable. The appendix is visualized and normal. The ascending and proximal transverse colon are collapsed. There is no focal inflammation. The descending and sigmoid colon are within normal limits. Vascular/Lymphatic: Minimal atherosclerotic calcifications are present in the aorta without aneurysm. Reproductive: Prostate is unremarkable. Other: A superior midline ventral hernia contains fat without bowel. There may be a prior repair. No other significant ventral hernia is present. Musculoskeletal: Endplate Schmorl's nodes are present. No focal lytic or blastic lesion is present otherwise. Vertebral body heights are maintained. Slight leftward curvature is present at L4-5. Bony pelvis is intact. The hips are within normal limits. IMPRESSION: 1. No acute or focal lesion to explain the patient's abdominal pain. 2. The bowel is within normal limits. There is no evidence for constipation. 3. Midline ventral hernia contains fat but no bowel or adhesion. 4. Atherosclerosis. 5. Previous stomach surgery. 6. Posterior lingular airspace disease likely reflects atelectasis. Electronically Signed   By: San Morelle M.D.   On:  06/16/2017 14:53    ASSESSMENT: Diffuse large B-cell lymphoma, dehydration, rectal prolapse.  PLAN:    1. Diffuse large B-cell lymphoma: Patient received cycle 7 of 8 on June 10, 2017. After lengthy discussion with the patient, he does not wish to pursue his final infusion scheduled for June 24, 2017. He has been instructed to keep that appointment for laboratory work and hospital follow-up. He also has a restaging CT scanner in follow-up in early October. No further intervention is needed. 2. Dehydration: Likely secondary to recent chemotherapy and increased nausea. Resolved. 3. Neutropenia: Secondary chemotherapy, repeat lab work at follow-up visit as above. 4. Anemia: Patient's hemoglobin is slowly trending down. He does not require a transfusion at this time, but would consider one if it continues to trend down and falls below 8.0. 5. Rectal prolapse: Patient has an appointment with GI as an outpatient in 1-2 weeks. Next  Appreciate consult, call with questions.  Lloyd Huger, MD   06/17/2017 9:36 PM

## 2017-06-17 NOTE — Progress Notes (Signed)
Pt for discharge home alert  No resp distress. Instructions discussed with pt and wife.  Meds/ diet  Activity and f/u discussed. Verbalizes understanding.  Sl d/cd. Out via w/c w/o c/o.

## 2017-06-17 NOTE — Discharge Instructions (Signed)
Hypotension As your heart beats, it forces blood through your body. This force is called blood pressure. If you have hypotension, you have low blood pressure. When your blood pressure is too low, you may not get enough blood to your brain. You may feel weak, feel light-headed, have a fast heartbeat, or even pass out (faint). Follow these instructions at home: Eating and drinking  Drink enough fluids to keep your pee (urine) clear or pale yellow.  Eat a healthy diet, and follow instructions from your doctor about eating or drinking restrictions. A healthy diet includes: ? Fresh fruits and vegetables. ? Whole grains. ? Low-fat (lean) meats. ? Low-fat dairy products.  Eat extra salt only as told. Do not add extra salt to your diet unless your doctor tells you to.  Eat small meals often.  Avoid standing up quickly after you eat. Medicines  Take over-the-counter and prescription medicines only as told by your doctor. ? Follow instructions from your doctor about changing how much you take (the dosage) of your medicines, if this applies. ? Do not stop or change your medicine on your own. General instructions  Wear compression stockings as told by your doctor.  Get up slowly from lying down or sitting.  Avoid hot showers and a lot of heat as told by your doctor.  Return to your normal activities as told by your doctor. Ask what activities are safe for you.  Do not use any products that contain nicotine or tobacco, such as cigarettes and e-cigarettes. If you need help quitting, ask your doctor.  Keep all follow-up visits as told by your doctor. This is important. Contact a doctor if:  You throw up (vomit).  You have watery poop (diarrhea).  You have a fever for more than 2-3 days.  You feel more thirsty than normal.  You feel weak and tired. Get help right away if:  You have chest pain.  You have a fast or irregular heartbeat.  You lose feeling (get numbness) in any part  of your body.  You cannot move your arms or your legs.  You have trouble talking.  You get sweaty or feel light-headed.  You faint.  You have trouble breathing.  You have trouble staying awake.  You feel confused. This information is not intended to replace advice given to you by your health care provider. Make sure you discuss any questions you have with your health care provider. Document Released: 02/03/2010 Document Revised: 07/28/2016 Document Reviewed: 07/28/2016 Elsevier Interactive Patient Education  2017 Elsevier Inc.   

## 2017-06-17 NOTE — Progress Notes (Signed)
Initial Nutrition Assessment  DOCUMENTATION CODES:   Obesity unspecified  INTERVENTION:  Provide Boost Breeze po QID, each supplement provides 250 kcal and 9 grams of protein.   Encouraged ongoing adequate intake of calories and protein and small, frequent meals to maintain weight.  NUTRITION DIAGNOSIS:   Inadequate oral intake related to poor appetite, other (see comment) (abdominal pain) as evidenced by per patient/family report.  GOAL:   Patient will meet greater than or equal to 90% of their needs  MONITOR:   PO intake, Supplement acceptance, Labs, Weight trends, I & O's  REASON FOR ASSESSMENT:   Malnutrition Screening Tool    ASSESSMENT:   66 year old male with PMHx of A-fib, GERD, hx of laparoscopic gastric sleeve resection, CAD, diffuse large B-cell lymphoma on R-CHOP who presents with complaints of abdominal pain found to have hypotension, rectal prolapse.   -Patient is followed by outpatient RD at cancer center.  Spoke with patient and his wife at bedside. Patient reports up until this admission his appetite had improved. He was eating at least 3 meals per day, but most days was able to eat 5 small meals per day. He typically eats yogurt, eggs with spinach, 1/2 watermelon, pasta with marinara, cheese and spinach ravioli over the course of the day. He also enjoys chicken. Recently had some steak. He reports he has been weight stable recently so he has not been drinking an oral nutrition supplement regularly. He reports currently his appetite is decreased. He is amenable to drinking Boost Breeze until appetite improves again. He is tired of Ensure and other "chalky" drinks.  UBW was 312 lbs. Do not see a weight that high in chart. Per chart patient was 301.5 lbs on 05/28/2016 and was 259.6 lbs on 04/15/2017. That is a weight loss of 41.9 lbs (13.9% body weight) over 9 months, which is not significant for time frame. He has been weight stable for the past 2  months.  Medications reviewed and include: allopurinol, Medline mouth rinse, MVI daily, pantoprazole, warfarin, NS @ 75 ml/hr.  Labs reviewed: Glucose 107.  Nutrition-Focused physical exam completed. Findings are no fat depletion, mild muscle depletion in temporal region, and no edema.   Patient does not meet criteria for malnutrition at this time.  Diet Order:  Diet regular Room service appropriate? Yes; Fluid consistency: Thin Diet general  Skin:  Reviewed, no issues  Last BM:  06/17/2017 - type 5  Height:   Ht Readings from Last 1 Encounters:  06/16/17 6\' 5"  (1.956 m)    Weight:   Wt Readings from Last 1 Encounters:  06/17/17 261 lb 8 oz (118.6 kg)    Ideal Body Weight:  94.5 kg  BMI:  Body mass index is 31.01 kg/m.  Estimated Nutritional Needs:   Kcal:  1610-9604 (MSJ x 1.2-1.3)  Protein:  140-165 grams (1.2-1.4 grams/kg)  Fluid:  2.5 L/day  EDUCATION NEEDS:   No education needs identified at this time  Willey Blade, MS, RD, LDN Pager: [phone] After Hours Pager: [phone]

## 2017-06-17 NOTE — Progress Notes (Signed)
CRITICAL VALUE ALERT  Critical Value:  Tropnin 0.03  Date & Time Notied:  1:44 AM 06/17/2017  Provider Notified:on- call hospitalist via [place]  Orders Received/Actions taken: no orders given

## 2017-06-17 NOTE — Progress Notes (Signed)
[place] [facility] [place] NAME: Nicholas Reynolds    MR#:  [id number]  DATE OF BIRTH:  [birth year]  SUBJECTIVE:  CHIEF COMPLAINT:   Chief Complaint  Patient presents with  . Hypotension  . Abdominal Pain  . Constipation   -Feels much better this morning. Blood pressure is improving. Slightly tachycardic. -Denies any abdominal pain  REVIEW OF SYSTEMS:  Review of Systems  Constitutional: Positive for malaise/fatigue. Negative for chills and fever.  HENT: Negative for congestion, ear discharge, hearing loss and nosebleeds.   Respiratory: Negative for cough, shortness of breath and wheezing.   Cardiovascular: Negative for chest pain and palpitations.  Gastrointestinal: Positive for abdominal pain. Negative for constipation, diarrhea, nausea and vomiting.  Genitourinary: Negative for dysuria.  Neurological: Positive for dizziness. Negative for speech change, focal weakness, seizures and headaches.  Psychiatric/Behavioral: Negative for depression.    DRUG ALLERGIES:  No Known Allergies  VITALS:  Blood pressure 106/60, pulse (!) 105, temperature 98.7 F (37.1 C), temperature source Oral, resp. rate (!) 24, height 6\' 5"  (1.956 m), weight 118.6 kg (261 lb 8 oz), SpO2 93 %.  PHYSICAL EXAMINATION:  Physical Exam  GENERAL:  66 y.o.-year-old patient lying in the bed with no acute distress.  EYES: Pupils equal, round, reactive to light and accommodation. No scleral icterus. Extraocular muscles intact.  HEENT: Head atraumatic, normocephalic. Oropharynx and nasopharynx clear.  NECK:  Supple, no jugular venous distention. No thyroid enlargement, no tenderness.  LUNGS: Normal breath sounds bilaterally, no wheezing, rales,rhonchi or crepitation. No use of accessory muscles of respiration.  CARDIOVASCULAR: S1, S2 normal. No murmurs, rubs, or gallops.  ABDOMEN: Soft, nontender, nondistended. Bowel sounds present. No organomegaly or mass.  EXTREMITIES:  No pedal edema, cyanosis, or clubbing.  NEUROLOGIC: Cranial nerves II through XII are intact. Muscle strength 5/5 in all extremities. Sensation intact. Gait not checked.  PSYCHIATRIC: The patient is alert and oriented x 3.  SKIN: No obvious rash, lesion, or ulcer.    LABORATORY PANEL:   CBC  Recent Labs Lab 06/17/17 0618  WBC 2.1*  HGB 8.2*  HCT 24.4*  PLT 180   ------------------------------------------------------------------------------------------------------------------  Chemistries   Recent Labs Lab 06/16/17 1332 06/17/17 0618  NA 137 138  K 4.1 4.0  CL 102 106  CO2 27 27  GLUCOSE 99 107*  BUN 23* 14  CREATININE 0.84 0.70  CALCIUM 8.9 8.7*  AST 18  --   ALT 16*  --   ALKPHOS 112  --   BILITOT 1.0  --    ------------------------------------------------------------------------------------------------------------------  Cardiac Enzymes  Recent Labs Lab 06/17/17 0618  TROPONINI <0.03   ------------------------------------------------------------------------------------------------------------------  RADIOLOGY:  Dg Chest Port 1 View  Result Date: 06/16/2017 CLINICAL DATA:  Abdominal pain and constipation. EXAM: PORTABLE CHEST 1 VIEW COMPARISON:  03/04/2017 FINDINGS: Heart size upper limits of normal. Mediastinal shadows are normal. Lungs are clear. The vascularity is normal. No effusions. Power port on the right with tip in the SVC 2 cm above the right atrium. IMPRESSION: No active disease.  Power port. Electronically Signed   By: Nelson Chimes M.D.   On: 06/16/2017 14:42   Ct Renal Stone Study  Result Date: 06/16/2017 CLINICAL DATA:  Generalized abdominal pain. Personal history of lymphoma. EXAM: CT ABDOMEN AND PELVIS WITHOUT CONTRAST TECHNIQUE: Multidetector CT imaging of the abdomen and pelvis was performed following the standard protocol without IV contrast. COMPARISON:  CT scan 06/07/2017. FINDINGS: Lower chest: Minimal airspace disease is noted  posteriorly in the lingula. Chief the lung bases are otherwise clear. Heart size is normal. Low-density blood pool is again seen. No significant pleural or pericardial effusion is present. Hepatobiliary: No focal liver abnormality is seen. Status post cholecystectomy. No biliary dilatation. Pancreas: Unremarkable. No pancreatic ductal dilatation or surrounding inflammatory changes. Spleen: Normal in size without focal abnormality. Adrenals/Urinary Tract: The adrenal glands are normal bilaterally. The kidneys are unremarkable. No focal stone or mass lesion is present. There is no hindfoot. The ureters and urinary bladder are within normal limits bilaterally. Stomach/Bowel: Postsurgical changes are again noted in the stomach. The duodenum is within normal limits. Small bowel is unremarkable. The appendix is visualized and normal. The ascending and proximal transverse colon are collapsed. There is no focal inflammation. The descending and sigmoid colon are within normal limits. Vascular/Lymphatic: Minimal atherosclerotic calcifications are present in the aorta without aneurysm. Reproductive: Prostate is unremarkable. Other: A superior midline ventral hernia contains fat without bowel. There may be a prior repair. No other significant ventral hernia is present. Musculoskeletal: Endplate Schmorl's nodes are present. No focal lytic or blastic lesion is present otherwise. Vertebral body heights are maintained. Slight leftward curvature is present at L4-5. Bony pelvis is intact. The hips are within normal limits. IMPRESSION: 1. No acute or focal lesion to explain the patient's abdominal pain. 2. The bowel is within normal limits. There is no evidence for constipation. 3. Midline ventral hernia contains fat but no bowel or adhesion. 4. Atherosclerosis. 5. Previous stomach surgery. 6. Posterior lingular airspace disease likely reflects atelectasis. Electronically Signed   By: San Morelle M.D.   On: 06/16/2017 14:53      EKG:   Orders placed or performed in visit on 06/16/17  . 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  #1 dizziness/presyncope-related to hypotension on presentation. -Blood pressure improved with IV fluids. Partly from dehydration. Also partly from being on high-dose prednisone and recently just stopping it 2 days ago. -Continue IV fluids this morning. Monitor blood pressure. -since slightly tachycardic, metoprolol can be restarted at discharge if his blood pressure is improving.  #2 abdominal pain with rectal prolapse-no constipation but has had fecal urge and rectal straining resulting in prolapse. Has burning pain. Past colonoscopies normal according to wife. -Started Anusol suppository. No GI coverage today. -Anyways on Coumadin, so advised to follow-up with GI as outpatient. -So advance diet  #3 acute on chronic anemia-hemoglobin drop could be dilutional. Also according to wife since it has been a week from his chemotherapy, his counts drop typically at this time. -Has no active bleeding. Follow up with oncology as outpatient -No indication for transfusion at this time.  #4 cardiac mural thrombus in proximal atrial fibrillation-following with the cardiologist in [place] post ablation and cardioversion, currently in normal sinus rhythm with slight tachycardia. -Restarted metoprolol at discharge.  -Mural thrombus diagnosed in November 2017. Follow up with repeat echocardiogram as outpatient. 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 -Patient finished 7 cycles of R-CHOP so far. Not interested in  getting his eighth cycle due to side effects.  #6 DVT prophylaxis-already on Coumadin. Currently held, will be restarted.     All the records are reviewed  and case discussed with Care Management/Social Workerr. Management plans discussed with the patient, family and they are in agreement.  CODE STATUS: Full Code  TOTAL TIME TAKING CARE OF THIS PATIENT: 37 minutes.   POSSIBLE D/C TODAY or tomorrow, DEPENDING ON CLINICAL CONDITION.   Gladstone Lighter M.D on 06/17/2017 at 8:40 AM  Between 7am to 6pm - Pager - [phone]  After 6pm go to [email or web address] - password [email or web address] [facility]  Office  [phone]  CC: Primary care physician; Valera Castle, MD

## 2017-06-17 NOTE — Progress Notes (Signed)
Patient had a bowel movement early this morning and there was a hint of pink tone in stool, but no bright red blood, very minimal. Patient had pain medication given at that time and currently states is comfortable. Will continue to monitor to end of shift.

## 2017-06-18 LAB — URINE CULTURE: Culture: NO GROWTH

## 2017-06-18 NOTE — Discharge Summary (Signed)
[facility]olas Reynolds at [place] NAME: Nicholas Reynolds    MR#:  [id number]  DATE OF BIRTH:  1951-08-08  DATE OF ADMISSION:  06/16/2017   ADMITTING PHYSICIAN: Theodoro Grist, MD  DATE OF DISCHARGE: 06/17/2017  6:07 PM  PRIMARY CARE PHYSICIAN: Valera Castle, MD   ADMISSION DIAGNOSIS:   Hypotension [I95.9] Hypotension, unspecified hypotension type [I95.9]  DISCHARGE DIAGNOSIS:   Active Problems:   Hypotension   Rectal prolapse   Abdominal pain   SECONDARY DIAGNOSIS:   Past Medical History:  Diagnosis Date  . Arthritis    thumbs  . Asymptomatic varicose veins of bilateral lower extremities    Uncomfortable but in no ulcers  . Cancer ([place])   . Complication of anesthesia    slow to wake after hernia surg.  was taking "a bunch" of herbals at that time  . Coronary artery disease, non-occlusive July 2008   Nonobstructive by cath   . Family history of premature coronary artery disease    Father  . GERD (gastroesophageal reflux disease)   . 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 Obstructive sleep apnea    severe per study 09/05/14   . Paroxysmal atrial fibrillation ([place]) 2002   a) Initially diagnosed as [place] Fib while in [place] (on ASA & BB after) - was on warfarin for a couple years and a Flecainide Pill-in Pocket PRN; b) recurrent A. fib with RVR August 2050 - status post cardioversion with flecainide, on low-dose beta blocker  . Pre-syncope May 2015   Unrevealing monitor in May  . Uses hearing aid    bilateral  . 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  #1 dizziness/presyncope-related to hypotension on presentation. -Blood pressure improved with IV fluids. Partly from dehydration. Also partly from being on high-dose prednisone and recently just stopping it 2 days ago. -Received IV fluids in the hospital and also one dose of Solu-Cortef. Blood pressure has improved. He is being discharged home.  #2 abdominal pain with rectal prolapse-no constipation but has had fecal urge and rectal straining resulting in prolapse. Has burning pain. Past colonoscopies normal according to wife. -Started Anusol suppository. No GI coverage in the hospital today. -Anyways on Coumadin, so advised to follow-up with GI as outpatient. -Tolerating advanced diet well.  #3 acute on chronic anemia-hemoglobin drop could be dilutional. Also according to wife since it has been a week from his chemotherapy, his counts drop typically at this time. -Has no active bleeding. Follow up with oncology as outpatient-recheck CBC within a week -No indication for transfusion at this time.  #4 cardiac mural thrombus in proximal atrial fibrillation-following with the cardiologist in [place] post ablation and cardioversion, currently in normal sinus rhythm with slight tachycardia. -Restarted metoprolol at discharge.  -Mural thrombus diagnosed in November 2017. Follow up with repeat echocardiogram as outpatient. Currently on Coumadin, INR is therapeutic at 2.4  #5 large B-cell lymphoma-follow-up  with oncology as outpatient. -Patient finished 7 cycles of R-CHOP so far. Not interested in getting his eighth cycle due to side effects. -Appreciate oncology input.  Patient will be discharged today as he is feeling  stable  DISCHARGE CONDITIONS:   Guarded  CONSULTS OBTAINED:   Treatment Team:  Lloyd Huger, MD Cammie Sickle, MD  DRUG ALLERGIES:   No Known Allergies DISCHARGE MEDICATIONS:   Allergies as of 06/17/2017   No Known Allergies     Medication List    STOP taking these medications   magic mouthwash w/lidocaine Soln   predniSONE 20 MG tablet Commonly known as:  DELTASONE   prochlorperazine 10 MG tablet Commonly known as:  COMPAZINE   senna 8.6 MG tablet Commonly known as:  SENOKOT     TAKE these medications   allopurinol 300 MG tablet Commonly known as:  ZYLOPRIM Take 1 tablet (300 mg total) by mouth daily.   cyclobenzaprine 5 MG tablet Commonly known as:  FLEXERIL Take 1 tablet (5 mg total) by mouth 3 (three) times daily as needed for muscle spasms.   hydrocortisone 25 MG suppository Commonly known as:  ANUSOL-HC Place 1 suppository (25 mg total) rectally 2 (two) times daily.   lidocaine-prilocaine cream Commonly known as:  EMLA Apply to affected area once   metoprolol tartrate 25 MG tablet Commonly known as:  LOPRESSOR Take 25 mg by mouth 2 (two) times daily.   multivitamin tablet Take 1 tablet by mouth daily.   omeprazole 40 MG capsule Commonly known as:  PRILOSEC Take 1 capsule (40 mg total) by mouth 2 (two) times daily.   ondansetron 8 MG tablet Commonly known as:  ZOFRAN Take 1 tablet (8 mg total) by mouth 2 (two) times daily as needed for refractory nausea / vomiting. Start on day 3 after cyclophosphamide chemotherapy.   Oxycodone HCl 10 MG Tabs Take 1 tablet (10 mg total) by mouth every 6 (six) hours as needed.   tamsulosin 0.4 MG Caps capsule Commonly known as:  FLOMAX Take 0.4 mg by mouth daily. PM   warfarin 5 MG tablet Commonly known as:  COUMADIN Take 5 mg by mouth daily. Mon and Fri 7.5 mg, all other days 5 mg        DISCHARGE INSTRUCTIONS:   1. PCP follow-up in 1-2 weeks 2. Follow-up with [facility] as per  schedule 3. GI follow up in 1-2 weeks for rectal prolapse  DIET:   Regular diet  ACTIVITY:   Activity as tolerated  OXYGEN:   Home Oxygen: No.  Oxygen Delivery: room air  DISCHARGE LOCATION:   home   If you experience worsening of your admission symptoms, develop shortness of breath, life threatening emergency, suicidal or homicidal thoughts you must seek medical attention immediately by calling 911 or calling your MD immediately  if symptoms less severe.  You Must read complete instructions/literature along with all the possible adverse reactions/side effects for all the Medicines you take and that have been prescribed to you. Take any new Medicines after you have completely understood and accpet all the possible adverse reactions/side effects.   Please note  You were cared for by a hospitalist during your hospital stay. If you have any questions about your discharge medications or the care you received while you were in the hospital after you are discharged, you can call the unit and asked to speak with the hospitalist on call if the hospitalist that took care of you is not available. Once you are  discharged, your primary care physician will handle any further medical issues. Please note that NO REFILLS for any discharge medications will be authorized once you are discharged, as it is imperative that you return to your primary care physician (or establish a relationship with a primary care physician if you do not have one) for your aftercare needs so that they can reassess your need for medications and monitor your lab values.    On the day of Discharge:  VITAL SIGNS:   Blood pressure (!) 104/56, pulse (!) 106, temperature 98.7 F (37.1 C), temperature source Oral, resp. rate 19, height 6\' 5"  (1.956 m), weight 118.6 kg (261 lb 8 oz), SpO2 97 %.  PHYSICAL EXAMINATION:    GENERAL:  67 y.o.-year-old patient lying in the bed with no acute distress.  EYES: Pupils equal, round,  reactive to light and accommodation. No scleral icterus. Extraocular muscles intact.  HEENT: Head atraumatic, normocephalic. Oropharynx and nasopharynx clear.  NECK:  Supple, no jugular venous distention. No thyroid enlargement, no tenderness.  LUNGS: Normal breath sounds bilaterally, no wheezing, rales,rhonchi or crepitation. No use of accessory muscles of respiration.  CARDIOVASCULAR: S1, S2 normal. No murmurs, rubs, or gallops.  ABDOMEN: Soft, nontender, nondistended. Bowel sounds present. No organomegaly or mass.  EXTREMITIES: No pedal edema, cyanosis, or clubbing.  NEUROLOGIC: Cranial nerves II through XII are intact. Muscle strength 5/5 in all extremities. Sensation intact. Gait not checked.  PSYCHIATRIC: The patient is alert and oriented x 3.  SKIN: No obvious rash, lesion, or ulcer.   DATA REVIEW:   CBC  Recent Labs Lab 06/17/17 0618  WBC 2.1*  HGB 8.2*  HCT 24.4*  PLT 180    Chemistries   Recent Labs Lab 06/16/17 1332 06/17/17 0618  NA 137 138  K 4.1 4.0  CL 102 106  CO2 27 27  GLUCOSE 99 107*  BUN 23* 14  CREATININE 0.84 0.70  CALCIUM 8.9 8.7*  AST 18  --   ALT 16*  --   ALKPHOS 112  --   BILITOT 1.0  --      Microbiology Results  Results for orders placed or performed during the hospital encounter of 06/16/17  Culture, blood (routine x 2)     Status: None (Preliminary result)   Collection Time: 06/16/17  1:50 PM  Result Value Ref Range Status   Specimen Description BLOOD LEFT ANTECUBITAL  Final   Special Requests   Final    BOTTLES DRAWN AEROBIC AND ANAEROBIC Blood Culture adequate volume   Culture NO GROWTH 2 DAYS  Final   Report Status PENDING  Incomplete  Culture, blood (routine x 2)     Status: None (Preliminary result)   Collection Time: 06/16/17  1:51 PM  Result Value Ref Range Status   Specimen Description BLOOD BLOOD LEFT ARM  Final   Special Requests   Final    BOTTLES DRAWN AEROBIC AND ANAEROBIC Blood Culture adequate volume   Culture  NO GROWTH 2 DAYS  Final   Report Status PENDING  Incomplete  Urine culture     Status: None   Collection Time: 06/16/17  4:00 PM  Result Value Ref Range Status   Specimen Description URINE, RANDOM  Final   Special Requests NONE  Final   Culture   Final    NO GROWTH Performed at [facility], [place] [address], [place], [place] [address]    Report Status 06/18/2017 FINAL  Final    RADIOLOGY:  No results found.  Management plans discussed with the patient, family and they are in agreement.  CODE STATUS:  Code Status History    Date Active Date Inactive Code Status Order ID Comments User Context   06/16/2017  6:06 PM 06/17/2017  9:12 PM Full Code [id number]  Theodoro Grist, MD Inpatient      TOTAL TIME TAKING CARE OF THIS PATIENT: 37 minutes.    Gladstone Lighter M.D on 06/18/2017 at 2:22 PM  Between 7am to 6pm - Pager - [phone]  After 6pm go to [email or web address] - Proofreader  [facility]  Office  [phone]  CC: Primary care physician; Valera Castle, MD   Note: This dictation was prepared with Dragon dictation along with smaller phrase technology. Any transcriptional errors that result from this process are unintentional.

## 2017-06-21 LAB — CULTURE, BLOOD (ROUTINE X 2)
CULTURE: NO GROWTH
Culture: NO GROWTH
SPECIAL REQUESTS: ADEQUATE
Special Requests: ADEQUATE

## 2017-06-24 ENCOUNTER — Inpatient Hospital Stay: Payer: 59 | Attending: Oncology | Admitting: Oncology

## 2017-06-24 ENCOUNTER — Inpatient Hospital Stay: Payer: 59

## 2017-06-24 ENCOUNTER — Telehealth: Payer: Self-pay | Admitting: *Deleted

## 2017-06-24 ENCOUNTER — Ambulatory Visit: Payer: 59

## 2017-06-24 VITALS — BP 90/60 | HR 60 | Temp 98.4°F | Resp 20 | Wt 260.9 lb

## 2017-06-24 DIAGNOSIS — G8929 Other chronic pain: Secondary | ICD-10-CM

## 2017-06-24 DIAGNOSIS — K5900 Constipation, unspecified: Secondary | ICD-10-CM

## 2017-06-24 DIAGNOSIS — Z87891 Personal history of nicotine dependence: Secondary | ICD-10-CM | POA: Diagnosis not present

## 2017-06-24 DIAGNOSIS — J322 Chronic ethmoidal sinusitis: Secondary | ICD-10-CM | POA: Insufficient documentation

## 2017-06-24 DIAGNOSIS — I517 Cardiomegaly: Secondary | ICD-10-CM | POA: Insufficient documentation

## 2017-06-24 DIAGNOSIS — K219 Gastro-esophageal reflux disease without esophagitis: Secondary | ICD-10-CM | POA: Insufficient documentation

## 2017-06-24 DIAGNOSIS — J320 Chronic maxillary sinusitis: Secondary | ICD-10-CM | POA: Insufficient documentation

## 2017-06-24 DIAGNOSIS — C8338 Diffuse large B-cell lymphoma, lymph nodes of multiple sites: Secondary | ICD-10-CM

## 2017-06-24 DIAGNOSIS — G4733 Obstructive sleep apnea (adult) (pediatric): Secondary | ICD-10-CM | POA: Diagnosis not present

## 2017-06-24 DIAGNOSIS — Z7901 Long term (current) use of anticoagulants: Secondary | ICD-10-CM | POA: Insufficient documentation

## 2017-06-24 DIAGNOSIS — R1310 Dysphagia, unspecified: Secondary | ICD-10-CM | POA: Diagnosis not present

## 2017-06-24 DIAGNOSIS — R531 Weakness: Secondary | ICD-10-CM

## 2017-06-24 DIAGNOSIS — Z79899 Other long term (current) drug therapy: Secondary | ICD-10-CM | POA: Diagnosis not present

## 2017-06-24 DIAGNOSIS — I480 Paroxysmal atrial fibrillation: Secondary | ICD-10-CM | POA: Diagnosis not present

## 2017-06-24 DIAGNOSIS — I4891 Unspecified atrial fibrillation: Secondary | ICD-10-CM

## 2017-06-24 DIAGNOSIS — R5383 Other fatigue: Secondary | ICD-10-CM

## 2017-06-24 DIAGNOSIS — I700 Atherosclerosis of aorta: Secondary | ICD-10-CM | POA: Insufficient documentation

## 2017-06-24 DIAGNOSIS — M5440 Lumbago with sciatica, unspecified side: Secondary | ICD-10-CM

## 2017-06-24 DIAGNOSIS — I2510 Atherosclerotic heart disease of native coronary artery without angina pectoris: Secondary | ICD-10-CM | POA: Diagnosis not present

## 2017-06-24 DIAGNOSIS — E860 Dehydration: Secondary | ICD-10-CM

## 2017-06-24 LAB — COMPREHENSIVE METABOLIC PANEL
ALBUMIN: 3.8 g/dL (ref 3.5–5.0)
ALT: 19 U/L (ref 17–63)
AST: 22 U/L (ref 15–41)
Alkaline Phosphatase: 82 U/L (ref 38–126)
Anion gap: 6 (ref 5–15)
BUN: 16 mg/dL (ref 6–20)
CHLORIDE: 102 mmol/L (ref 101–111)
CO2: 27 mmol/L (ref 22–32)
CREATININE: 0.99 mg/dL (ref 0.61–1.24)
Calcium: 9.3 mg/dL (ref 8.9–10.3)
GFR calc Af Amer: >60 mL/min (ref >60)
GLUCOSE: 131 mg/dL — AB (ref 65–99)
POTASSIUM: 4.1 mmol/L (ref 3.5–5.1)
Sodium: 135 mmol/L (ref 135–145)
Total Bilirubin: 0.4 mg/dL (ref 0.3–1.2)
Total Protein: 6.7 g/dL (ref 6.5–8.1)

## 2017-06-24 LAB — CBC WITH DIFFERENTIAL/PLATELET
BASOS ABS: 0.1 10*3/uL (ref 0–0.1)
Basophils Relative: 3 %
EOS ABS: 0 10*3/uL (ref 0–0.7)
Eosinophils Relative: 1 %
HCT: 29.9 % — ABNORMAL LOW (ref 40.0–52.0)
Hemoglobin: 10.2 g/dL — ABNORMAL LOW (ref 13.0–18.0)
LYMPHS ABS: 0.5 10*3/uL — AB (ref 1.0–3.6)
Lymphocytes Relative: 8 %
MCH: 31.1 pg (ref 26.0–34.0)
MCHC: 34.2 g/dL (ref 32.0–36.0)
MCV: 91 fL (ref 80.0–100.0)
MONO ABS: 0.8 10*3/uL (ref 0.2–1.0)
Monocytes Relative: 13 %
NEUTROS PCT: 75 %
Neutro Abs: 4.5 10*3/uL (ref 1.4–6.5)
PLATELETS: 219 10*3/uL (ref 150–440)
RBC: 3.29 MIL/uL — AB (ref 4.40–5.90)
RDW: 21.5 % — ABNORMAL HIGH (ref 11.5–14.5)
WBC: 5.9 10*3/uL (ref 3.8–10.6)

## 2017-06-24 MED ORDER — FENTANYL 25 MCG/HR TD PT72: 25.0000 ug | MEDICATED_PATCH | TRANSDERMAL | 0 refills | Status: DC

## 2017-06-24 NOTE — Progress Notes (Signed)
Patient here today for hospital follow up appointment. Patient has decided to keep his port in at this time, [facility] notified of patients decision.

## 2017-06-24 NOTE — Telephone Encounter (Signed)
Wife called asking for assistance with prior Authorization for fenta[employer]L. I explained the following proeccess, K. York RN will complete the authorization pharmacy will notify patient.

## 2017-06-24 NOTE — Progress Notes (Signed)
[place]  [phone] Fax:([phone]  ID: Nicholas Reynolds OB: 27-Apr-1951  MR#: [id number]  XMI#:[id number]  Patient Care Team: Valera Castle, MD as PCP - General  CHIEF COMPLAINT: Stage III DLBCL.  INTERVAL HISTORY: Patient returns to clinic today for hospital follow-up. He was admitted to the hospital on 06/16/17 for hypotension, abdominal pain and constipation. He received IV fluids for hypotension/dehydration, ansul suppository for rectal prolapse and constipation. Blood pressure medications were restarted before discharge. He is tolerating this well. His last chemo R-CHOP was 06/10/17. He does not want anymore chemotherapy at this time. Today he is feeling much better. He continues to complain of mild abdominal pain and back pain. He is checking his blood pressure several times per day. He is eating better. He staying hydrated. He is leaving for [place] at the beginning of September for 18 months. His wife has questions about stopping his Allpurinol now that he is longer receiving chemo. She also would like a PT order for his increasing weakness and chronic back pain.   REVIEW OF SYSTEMS:   Review of Systems  Constitutional: Negative for fever, malaise/fatigue and weight loss.  Respiratory: Negative for cough, hemoptysis and shortness of breath.   Cardiovascular: Negative for chest pain and leg swelling.  Gastrointestinal: Positive for abdominal pain and constipation. Negative for blood in stool, diarrhea, melena, nausea and vomiting.  Genitourinary: Negative.   Musculoskeletal: Positive for back pain.  Neurological: Positive for weakness. Negative for sensory change.  Psychiatric/Behavioral: The patient is nervous/anxious and has insomnia.     As per HPI. Otherwise, a complete review of systems is negative.  PAST MEDICAL HISTORY: Past Medical History:  Diagnosis Date  . Arthritis    thumbs  . Asymptomatic varicose veins of  bilateral lower extremities    Uncomfortable but in no ulcers  . Cancer ([place])   . Complication of anesthesia    slow to wake after hernia surg.  was taking "a bunch" of herbals at that time  . Coronary artery disease, non-occlusive July 2008   Nonobstructive by cath   . Family history of premature coronary artery disease    Father  . GERD (gastroesophageal reflux disease)   . 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 Obstructive sleep apnea    severe per study 09/05/14   . Paroxysmal atrial fibrillation ([place]) 2002   a) Initially diagnosed as [place] Fib while in [place] (on ASA & BB after) - was on warfarin for a couple years and a Flecainide Pill-in Pocket PRN; b) recurrent A. fib with RVR August 2050 - status post cardioversion with flecainide, on low-dose beta blocker  . Pre-syncope May 2015   Unrevealing monitor in May  . Uses hearing aid    bilateral  . Wears dentures    partial upper    PAST SURGICAL HISTORY: Past Surgical History:  Procedure Laterality Date  . CARDIAC CATHETERIZATION  2008   Nonobstructive CAD  . CHOLECYSTECTOMY    . ELBOW SURGERY    . Event monitor  May 2015   Unrevealing  . HERNIA REPAIR    . KNEE ARTHROSCOPY Left 06/28/2015   Procedure: ARTHROSCOPY KNEE WITH SYNOVECTOMY;  Surgeon: Leanor Kail, MD;  Location: [place] [facility];  Service: Orthopedics;  Laterality: Left;  CPAP  . [place] RESECTION     December 20 14th  . NM MYOVIEW LTD  June 28, 2014   [facility]: [facility] -- no evidence of ischemia or infarction. Normal EF ~58%  . PORTA CATH INSERTION N/A 03/15/2017   Procedure: Glori Luis Cath Insertion;  Surgeon: Algernon Huxley, MD;  Location: [facility];  Service: Cardiovascular;  Laterality: N/A;  . RADIOFREQUENCY ABLATION of Ventricular Tachycardia  July 2000 the   RVOT VT ablation: [facility], [place]    . TRANSTHORACIC ECHOCARDIOGRAM  May 2015   [facility] - Dr. Sharyn Lull: EF 55%, mild LVH; otherwise mostly normal    FAMILY HISTORY: Family History  Problem Relation Age of Onset  . Breast cancer Mother   . Bone cancer Mother   . COPD Father   . Arrhythmia Father   . Heart failure Father   . Heart attack Father     ADVANCED DIRECTIVES (Y/N):  N  HEALTH MAINTENANCE: Social History  Substance Use Topics  . Smoking status: Former Smoker    Quit date: 11/24/1999  . Smokeless tobacco: Never Used  . Alcohol use No     Colonoscopy:  PAP:  Bone density:  Lipid panel:  No Known Allergies  Current Outpatient Prescriptions  Medication Sig Dispense Refill  . allopurinol (ZYLOPRIM) 300 MG tablet Take 1 tablet (300 mg total) by mouth daily. 30 tablet 3  . cyclobenzaprine (FLEXERIL) 5 MG tablet Take 1 tablet (5 mg total) by mouth 3 (three) times daily as needed for muscle spasms. 60 tablet 0  . hydrocortisone (ANUSOL-HC) 25 MG suppository Place 1 suppository (25 mg total) rectally 2 (two) times daily. 12 suppository 1  . lidocaine-prilocaine (EMLA) cream Apply to affected area once 30 g 3  . metoprolol tartrate (LOPRESSOR) 25 MG tablet Take 25 mg by mouth 2 (two) times daily.  2  . Multiple Vitamin (MULTIVITAMIN) tablet Take 1 tablet by mouth daily.    Marland Kitchen omeprazole (PRILOSEC) 40 MG capsule Take 1 capsule (40 mg total) by mouth 2 (two) times daily. 180 capsule 1  . ondansetron (ZOFRAN) 8 MG tablet Take 1 tablet (8 mg total) by mouth 2 (two) times daily as needed for refractory nausea / vomiting. Start on day 3 after cyclophosphamide chemotherapy. 60 tablet 2  . Oxycodone HCl 10 MG TABS Take 1 tablet (10 mg total) by mouth every 6 (six) hours as needed.  90 tablet 0  . tamsulosin (FLOMAX) 0.4 MG CAPS capsule Take 0.4 mg by mouth daily. PM    . warfarin (COUMADIN) 5 MG tablet Take 5 mg by mouth daily. Mon and Fri 7.5 mg, all other days 5 mg     No current facility-administered medications for this visit.     OBJECTIVE: There were no vitals filed for this visit.   There is no height or weight on file to calculate BMI.    ECOG FS:0 - Asymptomatic  General: Well-developed, well-nourished, no acute distress. Eyes: Pink conjunctiva, anicteric sclera. HEENT: Clear oropharynx without exudate or erythema. Lungs: Clear to auscultation bilaterally. Heart: Regular rate and rhythm. No rubs, murmurs, or gallops. Abdomen: Soft, nontender, nondistended. No organomegaly noted, normoactive bowel sounds. Musculoskeletal: No edema, cyanosis, or clubbing. Neuro: Alert, answering all questions appropriately. Cranial nerves grossly intact. Skin: No rashes or petechiae noted. Psych: Normal affect. Lymphatics: No cervical, calvicular, axillary or inguinal  LAD.   LAB RESULTS:  Lab Results  Component Value Date   NA 135 06/24/2017   K 4.1 06/24/2017   CL 102 06/24/2017   CO2 27 06/24/2017   GLUCOSE 131 (H) 06/24/2017   BUN 16 06/24/2017   CREATININE 0.99 06/24/2017   CALCIUM 9.3 06/24/2017   PROT 6.7 06/24/2017   ALBUMIN 3.8 06/24/2017   AST 22 06/24/2017   ALT 19 06/24/2017   ALKPHOS 82 06/24/2017   BILITOT 0.4 06/24/2017   GFRNONAA >60 06/24/2017   GFRAA >60 06/24/2017    Lab Results  Component Value Date   WBC 5.9 06/24/2017   NEUTROABS 4.5 06/24/2017   HGB 10.2 (L) 06/24/2017   HCT 29.9 (L) 06/24/2017   MCV 91.0 06/24/2017   PLT 219 06/24/2017     STUDIES: Nm Cardiac Muga Rest  Result Date: 06/02/2017 CLINICAL DATA:  Diffuse large B-cell lymphoma. Undergoing cardiotoxic chemotherapy. EXAM: NUCLEAR MEDICINE CARDIAC BLOOD POOL IMAGING (MUGA) TECHNIQUE: Cardiac multi-gated acquisition was performed at rest following intravenous  injection of Tc-43mlabeled red blood cells. RADIOPHARMACEUTICALS:  19.8 mCi Tc-964mDP in-vitro labeled red blood cells IV COMPARISON:  Prior study 03/16/2017 FINDINGS: Normal left ventricular wall motion. No akinetic or dyskinetic segments are identified. The gallbladder ejection fraction was calculated at 53%. It was 66% on the prior study. IMPRESSION: Normal left ventricular wall motion with ejection fraction calculated at 53%. It was 66% on the prior study from April. Electronically Signed   By: P.Marijo Sanes.D.   On: 06/02/2017 13:52   Nm Pet Image Restag (ps) Skull Base To Thigh  Result Date: 06/07/2017 CLINICAL DATA:  Subsequent treatment strategy for diffuse large B- cell lymphoma. EXAM: NUCLEAR MEDICINE PET SKULL BASE TO THIGH TECHNIQUE: 12.2 mCi F-18 FDG was injected intravenously. Full-ring PET imaging was performed from the skull base to thigh after the radiotracer. CT data was obtained and used for attenuation correction and anatomic localization. FASTING BLOOD GLUCOSE:  Value: 102 mg/dl COMPARISON:  03/01/2017 FINDINGS: NECK No hypermetabolic lymph nodes in the neck. Chronic maxillary and ethmoid sinusitis. CHEST No hypermetabolic mediastinal or hilar nodes. No suspicious pulmonary nodules on the CT scan. Previously there is right lower paraspinal soft tissue mass spanning multiple levels which demonstrate high metabolic activity up to SUV of 34.3, there is only some very subtle pleural thickening in this vicinity on today's exam without significant hypermetabolic activity compatible with marked improvement. Right Port-A-Cath tip: Right atrium. Mild cardiomegaly. Low-density blood pool. ABDOMEN/PELVIS Previous hypermetabolic periaortic adenopathy and prior hypermetabolic lymph nodes or tumor deposits along the mesentery and omentum have essentially resolved. For example, the previously hypermetabolic 2.3 cm in short axis left lower periaortic lymph node shown on 2 image 224/3 of the prior exam  is not visible or measurable on today' s exam. No new hypermetabolic activity in the abdomen/pelvis. Postoperative findings of the stomach ; prior cholecystectomy. Aortoiliac atherosclerotic vascular disease. Normal splenic size and activity. SKELETON Diffuse accentuated marrow activity in the skeleton, probably a response to marrow stimulation such as granulocyte stimulation. Small lipoma of the right that vastus lateralis muscle is partially included on today' s exam. IMPRESSION: 1. Excellent response with resolution of the thoracic paraspinal, retroperitoneal, and omental/peritoneal hypermetabolic soft tissue lesions. No significant residual measurable disease, compatible with [place] 1. 2. Diffuse marrow activity, likely from marrow stimulation. 3. Other imaging findings of potential clinical significance: Chronic maxillary and ethmoid sinusitis. Aortic Atherosclerosis (ICD10-I70.0). Mild cardiomegaly. Low-density blood pool suggests anemia. Electronically Signed   By:  Van Clines M.D.   On: 06/07/2017 14:42   Dg Chest Port 1 View  Result Date: 06/16/2017 CLINICAL DATA:  Abdominal pain and constipation. EXAM: PORTABLE CHEST 1 VIEW COMPARISON:  03/04/2017 FINDINGS: Heart size upper limits of normal. Mediastinal shadows are normal. Lungs are clear. The vascularity is normal. No effusions. Power port on the right with tip in the SVC 2 cm above the right atrium. IMPRESSION: No active disease.  Power port. Electronically Signed   By: Nelson Chimes M.D.   On: 06/16/2017 14:42   Ct Renal Stone Study  Result Date: 06/16/2017 CLINICAL DATA:  Generalized abdominal pain. Personal history of lymphoma. EXAM: CT ABDOMEN AND PELVIS WITHOUT CONTRAST TECHNIQUE: Multidetector CT imaging of the abdomen and pelvis was performed following the standard protocol without IV contrast. COMPARISON:  CT scan 06/07/2017. FINDINGS: Lower chest: Minimal airspace disease is noted posteriorly in the lingula. Chief the lung bases  are otherwise clear. Heart size is normal. Low-density blood pool is again seen. No significant pleural or pericardial effusion is present. Hepatobiliary: No focal liver abnormality is seen. Status post cholecystectomy. No biliary dilatation. Pancreas: Unremarkable. No pancreatic ductal dilatation or surrounding inflammatory changes. Spleen: Normal in size without focal abnormality. Adrenals/Urinary Tract: The adrenal glands are normal bilaterally. The kidneys are unremarkable. No focal stone or mass lesion is present. There is no hindfoot. The ureters and urinary bladder are within normal limits bilaterally. Stomach/Bowel: Postsurgical changes are again noted in the stomach. The duodenum is within normal limits. Small bowel is unremarkable. The appendix is visualized and normal. The ascending and proximal transverse colon are collapsed. There is no focal inflammation. The descending and sigmoid colon are within normal limits. Vascular/Lymphatic: Minimal atherosclerotic calcifications are present in the aorta without aneurysm. Reproductive: Prostate is unremarkable. Other: A superior midline ventral hernia contains fat without bowel. There may be a prior repair. No other significant ventral hernia is present. Musculoskeletal: Endplate Schmorl's nodes are present. No focal lytic or blastic lesion is present otherwise. Vertebral body heights are maintained. Slight leftward curvature is present at L4-5. Bony pelvis is intact. The hips are within normal limits. IMPRESSION: 1. No acute or focal lesion to explain the patient's abdominal pain. 2. The bowel is within normal limits. There is no evidence for constipation. 3. Midline ventral hernia contains fat but no bowel or adhesion. 4. Atherosclerosis. 5. Previous stomach surgery. 6. Posterior lingular airspace disease likely reflects atelectasis. Electronically Signed   By: San Morelle M.D.   On: 06/16/2017 14:53    ASSESSMENT: Stage III DLBCL.  PLAN:     1. DLBCL: Bone marrow biopsy was negative for lymphoma. Pretreatment MUGA scan on March 16, 2017 reported an EF of 66% repeat in July 2018. PET scan results from June 07, 2017 reviewed independently and reported as above with essentially complete resolution of disease. Patient is moving back to [place] at the beginning of September but wishes to continue his follow-up care in [place]. Return to clinic on August 30, 2017 with repeat imaging and evaluation on the same day. He will get his port flushed before he leaves for [place]. 2. Atrial fibrillation: Patient underwent RFA on December 21, 2016. He also had a negative nuclear medicine cardiac stress test on January 13, 2017. 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 Patient will require continued cardiac follow-up in [place]. Once patient completes his treatment, his INR will need to be monitored by primary care or cardiology. No issues today.  3.  Pain: Improved. Continue oxycodone 10 mg every 6 hours as needed. 4. Constipation: better sense he was discharged from the hospital. Continue anusol suppository twice a day. 5. Nausea: Resolved. 6. Neutropenia: Resolved.  7. Mouth sores: Resolved. 8. Back pain/abdominal pain: RX 25 mcg Fentanyl patch prescribed. Continue Anusol suppository twice a day. 9. RTC in one month for PORT flush.  10. Fatigue/Weakness: PT consult placed.  11: Patient is okay to stop allopurinol since he is no longer receiving chemotherapy. Clarified with Dr. Grayland Ormond.  Patient expressed understanding and was in agreement with this plan. He also understands that He can call clinic at any time with any questions, concerns, or complaints.   Cancer Staging DLBCL (diffuse large B cell lymphoma) ([place]) Staging form: Hodgkin and Non-Hodgkin Lymphoma, AJCC 8th Edition - Clinical stage from 03/15/2017: Stage III (Diffuse large B-cell lymphoma) - Signed by Lloyd Huger, MD on 03/15/2017  Diffuse large B-cell  lymphoma of lymph nodes of multiple regions [facility]) Staging form: Hodgkin and Non-Hodgkin Lymphoma, AJCC 8th Edition - Clinical stage from 03/17/2017: Stage III (Diffuse large B-cell lymphoma) - Signed by Lloyd Huger, MD on 03/17/2017   Jacquelin Hawking, NP   06/24/2017 9:12 AM

## 2017-06-24 NOTE — Telephone Encounter (Signed)
Prior authorization faxed to [place], pending review.

## 2017-06-28 ENCOUNTER — Encounter: Admission: RE | Payer: Self-pay | Source: Ambulatory Visit

## 2017-06-28 ENCOUNTER — Ambulatory Visit: Admission: RE | Admit: 2017-06-28 | Payer: 59 | Source: Ambulatory Visit | Admitting: Vascular Surgery

## 2017-06-28 SURGERY — PORTA CATH REMOVAL
Anesthesia: Moderate Sedation

## 2017-06-29 ENCOUNTER — Encounter: Payer: Self-pay | Admitting: Physical Therapy

## 2017-06-29 ENCOUNTER — Ambulatory Visit: Payer: 59 | Attending: Oncology | Admitting: Physical Therapy

## 2017-06-29 DIAGNOSIS — M6281 Muscle weakness (generalized): Secondary | ICD-10-CM | POA: Diagnosis not present

## 2017-06-29 DIAGNOSIS — M545 Low back pain, unspecified: Secondary | ICD-10-CM

## 2017-06-29 DIAGNOSIS — R269 Unspecified abnormalities of gait and mobility: Secondary | ICD-10-CM

## 2017-06-29 DIAGNOSIS — G8929 Other chronic pain: Secondary | ICD-10-CM | POA: Insufficient documentation

## 2017-06-29 DIAGNOSIS — R293 Abnormal posture: Secondary | ICD-10-CM | POA: Diagnosis present

## 2017-07-01 ENCOUNTER — Ambulatory Visit: Payer: 59 | Admitting: Physical Therapy

## 2017-07-01 DIAGNOSIS — R269 Unspecified abnormalities of gait and mobility: Secondary | ICD-10-CM

## 2017-07-01 DIAGNOSIS — M6281 Muscle weakness (generalized): Secondary | ICD-10-CM | POA: Diagnosis not present

## 2017-07-01 DIAGNOSIS — G8929 Other chronic pain: Secondary | ICD-10-CM

## 2017-07-01 DIAGNOSIS — R293 Abnormal posture: Secondary | ICD-10-CM

## 2017-07-01 DIAGNOSIS — M545 Low back pain, unspecified: Secondary | ICD-10-CM

## 2017-07-02 ENCOUNTER — Encounter: Payer: Self-pay | Admitting: Physical Therapy

## 2017-07-02 NOTE — Therapy (Signed)
[facility] [address] [place], [place], [address] Phone: [phone]   Fax:  [phone]  Physical Therapy Evaluation  Patient Details  Name: Nicholas Reynolds MRN: [id number] Date of Birth: 01-Jan-1951 Referring Provider: Delight Hoh, MD  Encounter Date: 06/29/2017   Treatment 1 of 8.   End of cert.: 07/27/17  Past Medical History:  Diagnosis Date  . Arthritis    thumbs  . Asymptomatic varicose veins of bilateral lower extremities    Uncomfortable but in no ulcers  . Cancer ([place])   . Complication of anesthesia    slow to wake after hernia surg.  was taking "a bunch" of herbals at that time  . Coronary artery disease, non-occlusive July 2008   Nonobstructive by cath   . Family history of premature coronary artery disease    Father  . GERD (gastroesophageal reflux disease)   . 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 Obstructive sleep apnea    severe per study 09/05/14   . Paroxysmal atrial fibrillation ([place]) 2002   a) Initially diagnosed as Lone A Fib while in [place] (on ASA & BB after) - was on warfarin for a couple years and a Flecainide Pill-in Pocket PRN; b) recurrent A. fib with RVR August 2050 - status post cardioversion with flecainide, on low-dose beta blocker  . Pre-syncope May 2015   Unrevealing monitor in May  . Uses hearing aid    bilateral  . Wears dentures    partial upper    Past Surgical History:  Procedure Laterality Date  . CARDIAC CATHETERIZATION  2008   Nonobstructive CAD  . CHOLECYSTECTOMY    . ELBOW SURGERY    . Event monitor  May 2015   Unrevealing  . HERNIA REPAIR    . KNEE  ARTHROSCOPY Left 06/28/2015   Procedure: ARTHROSCOPY KNEE WITH SYNOVECTOMY;  Surgeon: Leanor Kail, MD;  Location: [place];  Service: Orthopedics;  Laterality: Left;  CPAP  . [place] RESECTION     December 20 14th  . NM MYOVIEW LTD  June 28, 2014   [facility]: [facility] -- no evidence of ischemia or infarction. Normal EF ~58%  . PORTA CATH INSERTION N/A 03/15/2017   Procedure: Glori Luis Cath Insertion;  Surgeon: Algernon Huxley, MD;  Location: [place] [facility];  Service: Cardiovascular;  Laterality: N/A;  . RADIOFREQUENCY ABLATION of Ventricular Tachycardia  July 2000 the   RVOT VT ablation: [facility]    . TRANSTHORACIC ECHOCARDIOGRAM  May 2015   [facility] - Dr. Sharyn Lull: EF 55%, mild LVH; otherwise mostly normal    There were no vitals filed for this visit.        [facility] PT Assessment - 07/02/17 0001      Assessment   Medical Diagnosis Stage III DLBCL   Referring Provider Delight Hoh, MD   Onset Date/Surgical Date 03/08/17   Prior Therapy yes, see PT notes.     Precautions   Precautions None     Restrictions   Weight Bearing Restrictions No     Balance Screen   Has the patient fallen in the past 6 months No  Has the patient had a decrease in activity level because of a fear of falling?  Yes   Is the patient reluctant to leave their home because of a fear of falling?  No     Prior Function   Level of Independence Independent       See handouts/ HEP.   Nustep L3 6 min. (B UE/LE)- moderate fatigue.         PT Long Term Goals - 06/30/17 1328      PT LONG TERM GOAL #1   Title Pt. independent with HEP to increase B LE muscle strength to grossly 4+/5 MMT to improve standing tolerance/ mobility.    Baseline B LE muscle strength grossly 4/5 MMT except quads 4+/5 MMT   Time 4   Period Weeks   Status New   Target Date 07/27/17     PT LONG TERM GOAL #2   Title Pt. will decrease  MODI to <30% to improve pain-free functional mobility.    Baseline MODI: 48% on 8/7   Time 4   Period Weeks   Status New   Target Date 07/27/17     PT LONG TERM GOAL #3   Title Pt. able to ambulate 10 minutes with no rest breaks to improve overall functional mobility.    Baseline significant limitiations with standing tolerance/ walking due to fatigue and dizziness.    Time 4   Period Weeks   Status New   Target Date 07/27/17     PT LONG TERM GOAL #4   Title Pt. able to stand from chair with c/o dizziness or back pain to improve daily ADLS.   Baseline pain limited with standing.  Increase dizziness   Time 4   Period Weeks   Status New   Target Date 07/27/17            Plan - 07/01/17 1346    Clinical Impression Statement Pt. is a pleasant 66 y/o male who has completed chemotherapy for Stage III DLBCL.  Pt. reports being very sedentary and sick over past 4 months.  Pt. c/o 4/10 LBP with standing/walking tasks.  Pt. has difficulty tolerating standing >2 minutes in PT clinic secondary to dizziness/ generalized B LE muscle fatigue.  B UE/LE AROM WFL.  B UE muscle strength grossly 4+/5 MMT.  B LE muscle strength grossly 4/5 MMT except quads 4+/5 MMT.  Pt. easily fatigued with repetitive LE AROM (no wts.) in supine/ seated positions.  Pt. requires numerous rest breaks during PT evaluation secondary to LE fatigue/ dizziness.  HR 118 bpm at rest.  MODI: 48% self-perceived severe disability.  Pt. ambulates short distances with use of SPC and slight antalgic gait pattern/ forward head and posture.  Pt. will benefit from skilled PT services to develop exercise/ walking program to increase B LE muscle strength/ upright posture/ pain managment to improve functional mobility.    Clinical Presentation Stable   Clinical Decision Making Moderate   Rehab Potential Good   PT Frequency 2x / week   PT Duration 4 weeks   PT Treatment/Interventions ADLs/Self Care Home Management;Aquatic  Therapy;Cryotherapy;Moist Heat;Gait training;Stair training;Functional mobility training;Therapeutic activities;Therapeutic exercise;Balance training;Patient/family education;Neuromuscular re-education   PT Next Visit Plan progress HEP   PT Home Exercise Plan see handouts   Consulted and Agree with Plan of Care Patient      Patient will benefit from skilled therapeutic intervention in order to improve the following deficits and impairments:  Abnormal gait, Improper body mechanics, Pain, Cardiopulmonary status  limiting activity, Decreased mobility, Postural dysfunction, Decreased strength, Decreased range of motion, Decreased endurance, Decreased activity tolerance, Decreased balance, Difficulty walking, Impaired flexibility  Visit Diagnosis: Muscle weakness (generalized)  Gait difficulty  Abnormal posture  Chronic midline low back pain without sciatica     Problem List Patient Active Problem List   Diagnosis Date Noted  . Hypotension 06/16/2017  . Rectal prolapse 06/16/2017  . Abdominal pain 06/16/2017  . Arthritis of knee 04/15/2017  . Goals of care, counseling/discussion 03/15/2017  . 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[place]) 03/15/2017  . Chronic back pain 03/09/2017  . Gastroesophageal reflux 03/09/2017  . Morbid obesity ([place]) 03/09/2017  . Peripheral vascular disease ([place]) 03/09/2017  . 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 Ventricular tachycardia ([place]) 03/09/2017  . DLBCL (diffuse large B cell lymphoma) ([place]) 03/08/2017  . 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 Atrial tachycardia ([place]) 09/09/2016  . Near syncope 09/09/2016  . Osteoarthritis of knee 08/11/2016  . Sleep apnea   . Family history of premature coronary artery disease   . Pigmented villonodular synovitis of left knee 07/09/2015  . Strain of left quadriceps tendon 04/18/2015  . Traumatic arthritis of knee, left 04/18/2015  . Pain in right knee 03/07/2015  . Obesity (BMI 30-39.9)  12/05/2014  . Orthostatic dizziness 12/05/2014  . Trigger finger of left thumb 09/06/2014  . Paroxysmal atrial fibrillation ([place]) 07/15/2014  . Varicose veins of lower extremity with edema 07/15/2014  . Fatigue 07/15/2014  . Hearing loss, sensorineural 04/26/2014  . Palpitations 04/06/2014  . 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 Benign non-nodular prostatic hyperplasia with lower urinary tract symptoms 03/10/2013  . Venous insufficiency 11/17/2012  . 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 H/O ventricular tachycardia 05/24/2007  . Coronary artery disease, non-occlusive 05/24/2007   Pura Spice, PT, DPT # [phone] 07/02/2017, 1:36 PM  [facility] [address] [place], [place], [address] Phone: [phone]   Fax:  [phone]  Name: Nicholas Reynolds MRN: [id number] Date of Birth: April 29, 1951

## 2017-07-02 NOTE — Therapy (Signed)
[facility] [address] [place], [place], [address] Phone: [phone]   Fax:  [phone]  Physical Therapy Treatment  Patient Details  Name: SHRAGA CUSTARD MRN: [id number] Date of Birth: 1950/12/30 Referring Provider: Delight Hoh, MD  Encounter Date: 07/01/2017      PT End of Session - 07/02/17 1356    Visit Number 2   Number of Visits 8   Date for PT Re-Evaluation 07/27/17   PT Start Time 0939   PT Stop Time 1035   PT Time Calculation (min) 56 min   Activity Tolerance Patient tolerated treatment well;Patient limited by fatigue   Behavior During Therapy [facility] for tasks assessed/performed      Past Medical History:  Diagnosis Date  . Arthritis    thumbs  . Asymptomatic varicose veins of bilateral lower extremities    Uncomfortable but in no ulcers  . Cancer ([place])   . Complication of anesthesia    slow to wake after hernia surg.  was taking "a bunch" of herbals at that time  . Coronary artery disease, non-occlusive July 2008   Nonobstructive by cath   . Family history of premature coronary artery disease    Father  . GERD (gastroesophageal reflux disease)   . 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 Obstructive sleep apnea    severe per study 09/05/14   . Paroxysmal atrial fibrillation ([place]) 2002   a) Initially diagnosed as Lone A Fib while in [place] (on ASA & BB after) - was on warfarin for a couple years and a Flecainide Pill-in Pocket PRN; b) recurrent A. fib with RVR August 2050 - status post cardioversion with flecainide, on low-dose beta blocker  . Pre-syncope May 2015   Unrevealing monitor in May   . Uses hearing aid    bilateral  . Wears dentures    partial upper    Past Surgical History:  Procedure Laterality Date  . CARDIAC CATHETERIZATION  2008   Nonobstructive CAD  . CHOLECYSTECTOMY    . ELBOW SURGERY    . Event monitor  May 2015   Unrevealing  . HERNIA REPAIR    . KNEE ARTHROSCOPY Left 06/28/2015   Procedure: ARTHROSCOPY KNEE WITH SYNOVECTOMY;  Surgeon: Leanor Kail, MD;  Location: [place];  Service: Orthopedics;  Laterality: Left;  CPAP  . [place] RESECTION     December 20 14th  . NM MYOVIEW LTD  June 28, 2014   [facility]: [facility] -- no evidence of ischemia or infarction. Normal EF ~58%  . PORTA CATH INSERTION N/A 03/15/2017   Procedure: Glori Luis Cath Insertion;  Surgeon: Algernon Huxley, MD;  Location: [place] [facility];  Service: Cardiovascular;  Laterality: N/A;  . RADIOFREQUENCY ABLATION of Ventricular Tachycardia  July 2000 the   RVOT VT ablation: [facility] [place]    . TRANSTHORACIC ECHOCARDIOGRAM  May 2015   [facility] - Dr. Sharyn Lull: EF 55%, mild LVH; otherwise mostly normal    There were no vitals filed for this visit.      Subjective Assessment - 07/02/17 1345    Subjective Pt. states he has been doing HEP.  Pt. states he is getting better and better every day since completing Chemo.  Pt. entered PT with use of SPC.   Pt. reports no back pain while sitting and talking with PT.     Limitations Lifting;Standing;Walking;House hold activities   Patient Stated Goals Progress standing tolerance/ Generalized muscle strength/ decrease back pain.    Currently in Pain? No/denies            [facility] PT Assessment - 07/02/17 0001      Assessment   Medical Diagnosis Stage III DLBCL   Referring Provider Delight Hoh, MD   Onset Date/Surgical Date 03/08/17   Prior Therapy yes, see PT notes.     Precautions   Precautions None     Restrictions   Weight Bearing Restrictions  No     Balance Screen   Has the patient fallen in the past 6 months No   Has the patient had a decrease in activity level because of a fear of falling?  Yes   Is the patient reluctant to leave their home because of a fear of falling?  No     Prior Function   Level of Independence Independent      BP: 97/64 HR: 115 bpm  There.ex.:  Discussed HEP/ activity level Standing hip flexion/ heel raises/ hip abd./ knee flexion 20x (seated rest break) Standing mini-squats 10x2 Walking in //-bars: forward/ backwards 3x (moderate cuing for posture correction). Sit to stands 5x (rest break due to dizziness).  Walking in clinic/ hallway with and without SPC Supine bolster bridges/ SAQ/ hip flexion/ B shoulder flexion/ horiz. Abd. 10x2 each Standing scap. Retraction: GTB 20x. Bicep curls 20x with no theraband (seated break). Scifit L6 10 min. (no rest break)- consistent cadence.            PT Long Term Goals - 06/30/17 1328      PT LONG TERM GOAL #1   Title Pt. independent with HEP to increase B LE muscle strength to grossly 4+/5 MMT to improve standing tolerance/ mobility.    Baseline B LE muscle strength grossly 4/5 MMT except quads 4+/5 MMT   Time 4   Period Weeks   Status New   Target Date 07/27/17     PT LONG TERM GOAL #2   Title Pt. will decrease MODI to <30% to improve pain-free functional mobility.    Baseline MODI: 48% on 8/7   Time 4   Period Weeks   Status New   Target Date 07/27/17     PT LONG TERM GOAL #3   Title Pt. able to ambulate 10 minutes with no rest breaks to improve overall functional mobility.    Baseline significant limitiations with standing tolerance/ walking due to fatigue and dizziness.    Time 4   Period Weeks   Status New   Target Date 07/27/17     PT LONG TERM GOAL #4   Title Pt. able to stand from chair with c/o dizziness or back pain to improve daily ADLS.   Baseline pain limited with standing.  Increase dizziness   Time 4   Period  Weeks   Status New   Target Date 07/27/17               Plan - 07/02/17 1356    Clinical Impression Statement Several episodes of dizziness with standing requiring seated rest breaks.  No LE resisted ex. and limited with standing scapular retraction with GTB 20x secondary to fatigue.  Pt. will continue  with current HEP and planning on walking around [employer] 2x this weekend.     Clinical Presentation Stable   Clinical Decision Making Moderate   Rehab Potential Good   PT Frequency 2x / week   PT Duration 4 weeks   PT Treatment/Interventions ADLs/Self Care Home Management;Aquatic Therapy;Cryotherapy;Moist Heat;Gait training;Stair training;Functional mobility training;Therapeutic activities;Therapeutic exercise;Balance training;Patient/family education;Neuromuscular re-education   PT Next Visit Plan progress HEP   PT Home Exercise Plan see handouts   Consulted and Agree with Plan of Care Patient      Patient will benefit from skilled therapeutic intervention in order to improve the following deficits and impairments:  Abnormal gait, Improper body mechanics, Pain, Cardiopulmonary status limiting activity, Decreased mobility, Postural dysfunction, Decreased strength, Decreased range of motion, Decreased endurance, Decreased activity tolerance, Decreased balance, Difficulty walking, Impaired flexibility  Visit Diagnosis: Muscle weakness (generalized)  Gait difficulty  Abnormal posture  Chronic midline low back pain without sciatica     Problem List Patient Active Problem List   Diagnosis Date Noted  . Hypotension 06/16/2017  . Rectal prolapse 06/16/2017  . Abdominal pain 06/16/2017  . Arthritis of knee 04/15/2017  . Goals of care, counseling/discussion 03/15/2017  . 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[place]) 03/15/2017  . Chronic back pain 03/09/2017  . Gastroesophageal reflux 03/09/2017  . Morbid obesity ([place]) 03/09/2017  . Peripheral vascular  disease ([place]) 03/09/2017  . 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 Ventricular tachycardia ([place]) 03/09/2017  . DLBCL (diffuse large B cell lymphoma) ([place]) 03/08/2017  . 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 Atrial tachycardia ([place]) 09/09/2016  . Near syncope 09/09/2016  . Osteoarthritis of knee 08/11/2016  . Sleep apnea   . Family history of premature coronary artery disease   . Pigmented villonodular synovitis of left knee 07/09/2015  . Strain of left quadriceps tendon 04/18/2015  . Traumatic arthritis of knee, left 04/18/2015  . Pain in right knee 03/07/2015  . Obesity (BMI 30-39.9) 12/05/2014  . Orthostatic dizziness 12/05/2014  . Trigger finger of left thumb 09/06/2014  . Paroxysmal atrial fibrillation ([place]) 07/15/2014  . Varicose veins of lower extremity with edema 07/15/2014  . Fatigue 07/15/2014  . Hearing loss, sensorineural 04/26/2014  . Palpitations 04/06/2014  . 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 Benign non-nodular prostatic hyperplasia with lower urinary tract symptoms 03/10/2013  . Venous insufficiency 11/17/2012  . 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 H/O ventricular tachycardia 05/24/2007  . Coronary artery disease, non-occlusive 05/24/2007   Pura Spice, PT, DPT # [phone] 07/02/2017, 1:59 PM  [facility] [address] [place], [place], [address] Phone: [phone]   Fax:  [phone]  Name: GOVERNOR MATOS MRN: [id number] Date of Birth: [birth year]

## 2017-07-05 ENCOUNTER — Other Ambulatory Visit: Payer: Self-pay | Admitting: Oncology

## 2017-07-05 DIAGNOSIS — C8338 Diffuse large B-cell lymphoma, lymph nodes of multiple sites: Secondary | ICD-10-CM

## 2017-07-06 ENCOUNTER — Encounter: Payer: 59 | Admitting: Physical Therapy

## 2017-07-08 ENCOUNTER — Ambulatory Visit: Payer: 59 | Admitting: Physical Therapy

## 2017-07-08 DIAGNOSIS — G8929 Other chronic pain: Secondary | ICD-10-CM

## 2017-07-08 DIAGNOSIS — M545 Low back pain, unspecified: Secondary | ICD-10-CM

## 2017-07-08 DIAGNOSIS — R269 Unspecified abnormalities of gait and mobility: Secondary | ICD-10-CM

## 2017-07-08 DIAGNOSIS — M6281 Muscle weakness (generalized): Secondary | ICD-10-CM | POA: Diagnosis not present

## 2017-07-08 DIAGNOSIS — R293 Abnormal posture: Secondary | ICD-10-CM

## 2017-07-09 ENCOUNTER — Encounter: Payer: Self-pay | Admitting: Physical Therapy

## 2017-07-09 NOTE — Therapy (Signed)
[facility] [address] [place], [place], [address] Phone: [phone]   Fax:  [phone]  Physical Therapy Treatment  Patient Details  Name: Nicholas Reynolds MRN: [id number] Date of Birth: 02/28/1951 Referring Provider: Delight Hoh, MD  Encounter Date: 07/08/2017      PT End of Session - 07/09/17 1617    Visit Number 3   Number of Visits 8   Date for PT Re-Evaluation 07/27/17   PT Start Time 0859   PT Stop Time 0952   PT Time Calculation (min) 53 min   Activity Tolerance Patient tolerated treatment well;Patient limited by fatigue   Behavior During Therapy [facility] for tasks assessed/performed      Past Medical History:  Diagnosis Date  . Arthritis    thumbs  . Asymptomatic varicose veins of bilateral lower extremities    Uncomfortable but in no ulcers  . Cancer ([place])   . Complication of anesthesia    slow to wake after hernia surg.  was taking "a bunch" of herbals at that time  . Coronary artery disease, non-occlusive July 2008   Nonobstructive by cath   . Family history of premature coronary artery disease    Father  . GERD (gastroesophageal reflux disease)   . 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 Obstructive sleep apnea    severe per study 09/05/14   . Paroxysmal atrial fibrillation ([place]) 2002   a) Initially diagnosed as Lone A Fib while in [place] (on ASA & BB after) - was on warfarin for a couple years and a Flecainide Pill-in Pocket PRN; b) recurrent A. fib with RVR August 2050 - status post cardioversion with flecainide, on low-dose beta blocker  . Pre-syncope May 2015   Unrevealing monitor in May   . Uses hearing aid    bilateral  . Wears dentures    partial upper    Past Surgical History:  Procedure Laterality Date  . CARDIAC CATHETERIZATION  2008   Nonobstructive CAD  . CHOLECYSTECTOMY    . ELBOW SURGERY    . Event monitor  May 2015   Unrevealing  . HERNIA REPAIR    . KNEE ARTHROSCOPY Left 06/28/2015   Procedure: ARTHROSCOPY KNEE WITH SYNOVECTOMY;  Surgeon: Leanor Kail, MD;  Location: [place];  Service: Orthopedics;  Laterality: Left;  CPAP  . [place] RESECTION     December 20 14th  . NM MYOVIEW LTD  June 28, 2014   [facility]: [facility] -- no evidence of ischemia or infarction. Normal EF ~58%  . PORTA CATH INSERTION N/A 03/15/2017   Procedure: Glori Luis Cath Insertion;  Surgeon: Algernon Huxley, MD;  Location: [place] [facility];  Service: Cardiovascular;  Laterality: N/A;  . RADIOFREQUENCY ABLATION of Ventricular Tachycardia  July 2000 the   RVOT VT ablation: [facility], [place]    . TRANSTHORACIC ECHOCARDIOGRAM  May 2015   [facility] - Dr. Sharyn Lull: EF 55%, mild LVH; otherwise mostly normal    There were no vitals filed for this visit.      Subjective Assessment - 07/09/17 1616    Subjective Pt. states he didn't sleep well last night  and ended up in recliner.  Pt. states his low back is hurting and feels stiff.     Limitations Lifting;Standing;Walking;House hold activities   Patient Stated Goals Progress standing tolerance/ Generalized muscle strength/ decrease back pain.    Currently in Pain? Yes   Pain Score 3    Pain Location Back   Pain Orientation Lower;Mid   Pain Descriptors / Indicators Aching   Pain Type Chronic pain       HR: 108 bpm  There.ex.:  Supine LE/lumbar stretches 7 min.  Prone grade III mobs. To low thoracic/lumbar spine 1x20 sec. (central/unilateral). Standing hip flexion/ heel raises/ hip abd./ knee flexion 20x (cuing for posture correction).  Standing  mini-squats 10x2 Walking in //-bars: forward/ backwards 3x (moderate cuing for posture correction). Seated trunk rotn. L/R (2 cavitations noted).  Walking in clinic/ hallway without SPC  (consistent heel strike/ increase cadence). Supine bolster bridges/ SAQ/ hip flexion/ B shoulder flexion/ horiz. Abd. 10x2 each Supine B shoulder flexion with controlled breathing. Scifit L6-7 10 min. (no rest break)- consistent cadence.          PT Long Term Goals - 06/30/17 1328      PT LONG TERM GOAL #1   Title Pt. independent with HEP to increase B LE muscle strength to grossly 4+/5 MMT to improve standing tolerance/ mobility.    Baseline B LE muscle strength grossly 4/5 MMT except quads 4+/5 MMT   Time 4   Period Weeks   Status New   Target Date 07/27/17     PT LONG TERM GOAL #2   Title Pt. will decrease MODI to <30% to improve pain-free functional mobility.    Baseline MODI: 48% on 8/7   Time 4   Period Weeks   Status New   Target Date 07/27/17     PT LONG TERM GOAL #3   Title Pt. able to ambulate 10 minutes with no rest breaks to improve overall functional mobility.    Baseline significant limitiations with standing tolerance/ walking due to fatigue and dizziness.    Time 4   Period Weeks   Status New   Target Date 07/27/17     PT LONG TERM GOAL #4   Title Pt. able to stand from chair with c/o dizziness or back pain to improve daily ADLS.   Baseline pain limited with standing.  Increase dizziness   Time 4   Period Weeks   Status New   Target Date 07/27/17               Plan - 07/09/17 1618    Clinical Impression Statement Marked improvement in overall treatment and standing tolerance with ther.ex.  Decrease c/o low back stiffness after stretching/ mobs.  Pt. ambulates around PT clinic with no assistive device and consistent heel strike.     Clinical Presentation Stable   Clinical Decision Making Moderate   Rehab Potential Good   PT Frequency 2x / week   PT  Duration 4 weeks   PT Treatment/Interventions ADLs/Self Care Home Management;Aquatic Therapy;Cryotherapy;Moist Heat;Gait training;Stair training;Functional mobility training;Therapeutic activities;Therapeutic exercise;Balance training;Patient/family education;Neuromuscular re-education   PT Next Visit Plan progress HEP   PT Home Exercise Plan see handouts   Consulted and Agree with Plan of Care Patient      Patient will benefit from skilled therapeutic intervention in order to improve the following deficits and impairments:  Abnormal gait, Improper body mechanics, Pain, Cardiopulmonary status limiting activity, Decreased mobility, Postural dysfunction, Decreased strength, Decreased range of  motion, Decreased endurance, Decreased activity tolerance, Decreased balance, Difficulty walking, Impaired flexibility  Visit Diagnosis: Muscle weakness (generalized)  Gait difficulty  Abnormal posture  Chronic midline low back pain without sciatica     Problem List Patient Active Problem List   Diagnosis Date Noted  . Hypotension 06/16/2017  . Rectal prolapse 06/16/2017  . Abdominal pain 06/16/2017  . Arthritis of knee 04/15/2017  . Goals of care, counseling/discussion 03/15/2017  . 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[place]) 03/15/2017  . Chronic back pain 03/09/2017  . Gastroesophageal reflux 03/09/2017  . Morbid obesity ([place]) 03/09/2017  . Peripheral vascular disease ([place]) 03/09/2017  . 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 Ventricular tachycardia ([place]) 03/09/2017  . DLBCL (diffuse large B cell lymphoma) ([place]) 03/08/2017  . 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 Atrial tachycardia ([place]) 09/09/2016  . Near syncope 09/09/2016  . Osteoarthritis of knee 08/11/2016  . Sleep apnea   . Family history of premature coronary artery disease   . Pigmented villonodular synovitis of left knee 07/09/2015  . Strain of left quadriceps tendon 04/18/2015  . Traumatic arthritis of  knee, left 04/18/2015  . Pain in right knee 03/07/2015  . Obesity (BMI 30-39.9) 12/05/2014  . Orthostatic dizziness 12/05/2014  . Trigger finger of left thumb 09/06/2014  . Paroxysmal atrial fibrillation ([place]) 07/15/2014  . Varicose veins of lower extremity with edema 07/15/2014  . Fatigue 07/15/2014  . Hearing loss, sensorineural 04/26/2014  . Palpitations 04/06/2014  . 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 Benign non-nodular prostatic hyperplasia with lower urinary tract symptoms 03/10/2013  . Venous insufficiency 11/17/2012  . 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 H/O ventricular tachycardia 05/24/2007  . Coronary artery disease, non-occlusive 05/24/2007   Pura Spice, PT, DPT # [id number] r8/17/2018, 4:21 PM  [facility] [address] [place], [place], [address] Phone: [phone]   Fax:  [phone]  Name: LAW CORSINO MRN: [id number] Date of Birth: Feb 18, 1951

## 2017-07-12 ENCOUNTER — Other Ambulatory Visit: Payer: Self-pay | Admitting: Gastroenterology

## 2017-07-12 DIAGNOSIS — R194 Change in bowel habit: Secondary | ICD-10-CM

## 2017-07-12 DIAGNOSIS — C8330 Diffuse large B-cell lymphoma, unspecified site: Secondary | ICD-10-CM

## 2017-07-13 ENCOUNTER — Ambulatory Visit: Payer: 59 | Admitting: Physical Therapy

## 2017-07-13 ENCOUNTER — Encounter: Payer: Self-pay | Admitting: Physical Therapy

## 2017-07-13 DIAGNOSIS — M545 Low back pain, unspecified: Secondary | ICD-10-CM

## 2017-07-13 DIAGNOSIS — R293 Abnormal posture: Secondary | ICD-10-CM

## 2017-07-13 DIAGNOSIS — M6281 Muscle weakness (generalized): Secondary | ICD-10-CM

## 2017-07-13 DIAGNOSIS — R269 Unspecified abnormalities of gait and mobility: Secondary | ICD-10-CM

## 2017-07-13 DIAGNOSIS — G8929 Other chronic pain: Secondary | ICD-10-CM

## 2017-07-13 NOTE — Therapy (Signed)
[facility] [address] [place], [place], [address] Phone: [phone]   Fax:  [phone]  Physical Therapy Treatment  Patient Details  Name: Nicholas Reynolds MRN: [id number] Date of Birth: [birth year] Referring Provider: Delight Hoh, MD  Encounter Date: 07/13/2017      PT End of Session - 07/13/17 1610    Visit Number 4   Number of Visits 8   Date for PT Re-Evaluation 07/27/17   PT Start Time 0848   PT Stop Time 0939   PT Time Calculation (min) 51 min   Activity Tolerance Patient tolerated treatment well;Patient limited by fatigue   Behavior During Therapy [facility] for tasks assessed/performed      Past Medical History:  Diagnosis Date  . Arthritis    thumbs  . Asymptomatic varicose veins of bilateral lower extremities    Uncomfortable but in no ulcers  . Cancer ([place])   . Complication of anesthesia    slow to wake after hernia surg.  was taking "a bunch" of herbals at that time  . Coronary artery disease, non-occlusive July 2008   Nonobstructive by cath   . Family history of premature coronary artery disease    Father  . GERD (gastroesophageal reflux disease)   . 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 Obstructive sleep apnea    severe per study 09/05/14   . Paroxysmal atrial fibrillation ([place]) 2002   a) Initially diagnosed as Lone A Fib while in [place] (on ASA & BB after) - was on warfarin for a couple years and a Flecainide Pill-in Pocket PRN; b) recurrent A. fib with RVR August 2050 - status post cardioversion with flecainide, on low-dose beta blocker  . Pre-syncope May 2015   Unrevealing monitor in May   . Uses hearing aid    bilateral  . Wears dentures    partial upper    Past Surgical History:  Procedure Laterality Date  . CARDIAC CATHETERIZATION  2008   Nonobstructive CAD  . CHOLECYSTECTOMY    . ELBOW SURGERY    . Event monitor  May 2015   Unrevealing  . HERNIA REPAIR    . KNEE ARTHROSCOPY Left 06/28/2015   Procedure: ARTHROSCOPY KNEE WITH SYNOVECTOMY;  Surgeon: Leanor Kail, MD;  Location: [place];  Service: Orthopedics;  Laterality: Left;  CPAP  . [place] RESECTION     December 20 14th  . NM MYOVIEW LTD  June 28, 2014   [facility]: [facility] -- no evidence of ischemia or infarction. Normal EF ~58%  . PORTA CATH INSERTION N/A 03/15/2017   Procedure: Glori Luis Cath Insertion;  Surgeon: Algernon Huxley, MD;  Location: [place] [facility];  Service: Cardiovascular;  Laterality: N/A;  . RADIOFREQUENCY ABLATION of Ventricular Tachycardia  July 2000 the   RVOT VT ablation: [facility], [place]    . TRANSTHORACIC ECHOCARDIOGRAM  May 2015   [facility] - Dr. Sharyn Lull: EF 55%, mild LVH; otherwise mostly normal    There were no vitals filed for this visit.      Subjective Assessment - 07/13/17 1607    Subjective Pt reports he feels much better today than  he did in the previous tx session, and reports he has minimal LBP.   Limitations Lifting;Standing;Walking;House hold activities   Patient Stated Goals Progress standing tolerance/ Generalized muscle strength/ decrease back pain.    Currently in Pain? Yes   Pain Score 1    Pain Location Back   Pain Orientation Lower;Mid   Pain Descriptors / Indicators Aching   Pain Type Chronic pain   Pain Onset More than a month ago   Pain Frequency Intermittent   Multiple Pain Sites No     Ther.ex.:  Supine hip flexion stretches 3x30s BLE  Marching in // bars x6 laps Sideways walking in // bars x4 laps  Heel raises x15 Toe raises x15 Standing mini-squats  2x10 Standing biceps curls with 4lb weights Standing shoulder abduction and flexion with 3lb weights Nautilius machine 30lbs lat pull downs, 20lbs triceps extensions, 20lbs chest press, 20lbs scapular retractions x15 each  Scifit L8 10 min (not billed)         PT Education - 07/13/17 1610    Education provided Yes   Education Details continuance of HEP   Person(s) Educated Patient;Spouse   Methods Explanation   Comprehension Verbalized understanding             PT Long Term Goals - 06/30/17 1328      PT LONG TERM GOAL #1   Title Pt. independent with HEP to increase B LE muscle strength to grossly 4+/5 MMT to improve standing tolerance/ mobility.    Baseline B LE muscle strength grossly 4/5 MMT except quads 4+/5 MMT   Time 4   Period Weeks   Status New   Target Date 07/27/17     PT LONG TERM GOAL #2   Title Pt. will decrease MODI to <30% to improve pain-free functional mobility.    Baseline MODI: 48% on 8/7   Time 4   Period Weeks   Status New   Target Date 07/27/17     PT LONG TERM GOAL #3   Title Pt. able to ambulate 10 minutes with no rest breaks to improve overall functional mobility.    Baseline significant limitiations with standing tolerance/ walking due to fatigue and dizziness.    Time 4   Period Weeks   Status New   Target Date 07/27/17     PT LONG TERM GOAL #4   Title Pt. able to stand from chair with c/o dizziness or back pain to improve daily ADLS.   Baseline pain limited with standing.  Increase dizziness   Time 4   Period Weeks   Status New   Target Date 07/27/17               Plan - 07/13/17 1752    Clinical Impression Statement Pt demonstrated signifcant improvement in tolerance of standing therex, and only required one seated rest break t/o entire tx session. Pt reports he feels signifcantly better than he did during previous tx session and spouse agreed.    Clinical Presentation Stable   Clinical Decision Making Moderate    Rehab Potential Good   PT Frequency 2x / week   PT Duration 4 weeks   PT Treatment/Interventions ADLs/Self Care Home Management;Aquatic Therapy;Cryotherapy;Moist Heat;Gait training;Stair training;Functional mobility training;Therapeutic activities;Therapeutic exercise;Balance training;Patient/family education;Neuromuscular re-education   PT Next Visit Plan progress HEP   PT Home Exercise Plan see handouts   Consulted and Agree with Plan of Care Patient      Patient will benefit from skilled therapeutic intervention in order  to improve the following deficits and impairments:  Abnormal gait, Improper body mechanics, Pain, Cardiopulmonary status limiting activity, Decreased mobility, Postural dysfunction, Decreased strength, Decreased range of motion, Decreased endurance, Decreased activity tolerance, Decreased balance, Difficulty walking, Impaired flexibility  Visit Diagnosis: Muscle weakness (generalized)  Gait difficulty  Abnormal posture  Chronic midline low back pain without sciatica     Problem List Patient Active Problem List   Diagnosis Date Noted  . Hypotension 06/16/2017  . Rectal prolapse 06/16/2017  . Abdominal pain 06/16/2017  . Arthritis of knee 04/15/2017  . Goals of care, counseling/discussion 03/15/2017  . 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[place]) 03/15/2017  . Chronic back pain 03/09/2017  . Gastroesophageal reflux 03/09/2017  . Morbid obesity ([place]) 03/09/2017  . Peripheral vascular disease ([place]) 03/09/2017  . 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 Ventricular tachycardia ([place]) 03/09/2017  . DLBCL (diffuse large B cell lymphoma) ([place]) 03/08/2017  . 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 Atrial tachycardia ([place]) 09/09/2016  . Near syncope 09/09/2016  . Osteoarthritis of knee 08/11/2016  . Sleep apnea   . Family history of premature coronary artery disease   . Pigmented villonodular synovitis of left knee 07/09/2015  . Strain of left  quadriceps tendon 04/18/2015  . Traumatic arthritis of knee, left 04/18/2015  . Pain in right knee 03/07/2015  . Obesity (BMI 30-39.9) 12/05/2014  . Orthostatic dizziness 12/05/2014  . Trigger finger of left thumb 09/06/2014  . Paroxysmal atrial fibrillation ([place]) 07/15/2014  . Varicose veins of lower extremity with edema 07/15/2014  . Fatigue 07/15/2014  . Hearing loss, sensorineural 04/26/2014  . Palpitations 04/06/2014  . 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 Benign non-nodular prostatic hyperplasia with lower urinary tract symptoms 03/10/2013  . Venous insufficiency 11/17/2012  . 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 H/O ventricular tachycardia 05/24/2007  . Coronary artery disease, non-occlusive 05/24/2007   Pura Spice, PT, DPT # [id number] Betsy Coder, SPT 07/14/2017, 7:26 AM  [facility] [address] [place], [place], [address] Phone: [phone]   Fax:  [phone]  Name: Nicholas Reynolds MRN: [id number] Date of Birth: 06/30/1951

## 2017-07-15 ENCOUNTER — Ambulatory Visit: Payer: 59 | Admitting: Physical Therapy

## 2017-07-16 ENCOUNTER — Ambulatory Visit
Admission: RE | Admit: 2017-07-16 | Discharge: 2017-07-16 | Disposition: A | Discharge status: Home / Self Care | Payer: 59 | Source: Ambulatory Visit | Attending: Gastroenterology | Admitting: Gastroenterology

## 2017-07-16 DIAGNOSIS — R194 Change in bowel habit: Secondary | ICD-10-CM

## 2017-07-16 DIAGNOSIS — C8330 Diffuse large B-cell lymphoma, unspecified site: Secondary | ICD-10-CM

## 2017-07-18 NOTE — Progress Notes (Signed)
[place]  Telephone:([phone]) [phone] Fax:(336) [phone]  ID: Nicholas Reynolds OB: 10-28-1951  MR#: [id number]  YOV#:[id number]  Patient Care Team: Valera Castle, MD as PCP - General  CHIEF COMPLAINT: Stage III DLBCL.  INTERVAL HISTORY: Patient returns to clinic today for follow-up and medication refill. He completed 7 cycles of R-CHOP chemotherapy and discontinued after cycle 7 due to side effects including mouth sores, weakness, fatigue, and mild anemia. He had some episodes of hypotension but is followed by cardiology at [facility]. He continues anticoagulation for hx of afib. He reports feeling well today and still plans to move to [place] in the next few weeks. He has a CT scan for re-imaging on October 8th. He plans to continue to receive oncology care here in [place] versus transferring care to [place]. He denies nausea or fatigue today, dizziness. He continues to have back pain and plans to see orthopedics in [place]. He feels his appetite has stabilized, denies neurologic complaints, chest pain, and/or shortness of breath. He denies urinary complaints. He is accompanied by his family member today.  REVIEW OF SYSTEMS:   Review of Systems  Constitutional: Negative for fever, malaise/fatigue and weight loss.  Respiratory: Negative for cough, hemoptysis and shortness of breath.   Cardiovascular: Negative for chest pain and leg swelling.  Gastrointestinal: Negative for abdominal pain, blood in stool, constipation, diarrhea, melena, nausea and vomiting.  Genitourinary: Negative.   Musculoskeletal: Positive for back pain.  Neurological: Negative for dizziness, sensory change and weakness.  Psychiatric/Behavioral: The patient is nervous/anxious.     As per HPI. Otherwise, a complete review of systems is negative.  PAST MEDICAL HISTORY: Past Medical History:  Diagnosis Date  . Arthritis    thumbs  . Asymptomatic varicose veins of bilateral lower extremities    Uncomfortable but in no ulcers  . Cancer ([place])   . Complication of anesthesia    slow to wake after hernia surg.  was taking "a bunch" of herbals at that time  . Coronary artery disease, non-occlusive July 2008   Nonobstructive by cath   . Family history of premature coronary artery disease    Father  . GERD (gastroesophageal reflux disease)   . 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 Obstructive sleep apnea    severe per study 09/05/14   . Paroxysmal atrial fibrillation ([place]) 2002   a) Initially diagnosed as Lone A Fib while in [place] (on ASA & BB after) - was on warfarin for a couple years and a Flecainide Pill-in Pocket PRN; b) recurrent A. fib with RVR August 2050 - status post cardioversion with flecainide, on low-dose beta blocker  . Pre-syncope May 2015   Unrevealing monitor in May  . Uses hearing aid    bilateral  . Wears dentures    partial upper    PAST SURGICAL HISTORY: Past Surgical History:  Procedure Laterality Date  . CARDIAC CATHETERIZATION  2008   Nonobstructive CAD  . CHOLECYSTECTOMY    . ELBOW SURGERY    . Event monitor  May 2015   Unrevealing  . HERNIA REPAIR    . KNEE ARTHROSCOPY Left 06/28/2015   Procedure: ARTHROSCOPY KNEE WITH SYNOVECTOMY;  Surgeon: Leanor Kail, MD;  Location: [place]  [facility];  Service: Orthopedics;  Laterality: Left;  CPAP  . [place] RESECTION     December 20 14th  . NM MYOVIEW LTD  June 28, 2014   [facility]: [facility] -- no evidence of ischemia or infarction. Normal EF ~58%  . PORTA CATH INSERTION N/A 03/15/2017   Procedure: Glori Luis Cath Insertion;  Surgeon: Algernon Huxley, MD;  Location: [place]  [facility];  Service: Cardiovascular;  Laterality: N/A;  . RADIOFREQUENCY ABLATION of Ventricular Tachycardia  July 2000 the   RVOT VT ablation: [facility] [place]    . TRANSTHORACIC ECHOCARDIOGRAM  May 2015   [facility] - Dr. Sharyn Lull: EF 55%, mild LVH; otherwise mostly normal    FAMILY HISTORY: Family History  Problem Relation Age of Onset  . Breast cancer Mother   . Bone cancer Mother   . COPD Father   . Arrhythmia Father   . Heart failure Father   . Heart attack Father     ADVANCED DIRECTIVES (Y/N):  N  HEALTH MAINTENANCE: Social History  Substance Use Topics  . Smoking status: Former Smoker    Quit date: 11/24/1999  . Smokeless tobacco: Never Used  . Alcohol use No     Colonoscopy:  PAP:  Bone density:  Lipid panel:  No Known Allergies  Current Outpatient Prescriptions  Medication Sig Dispense Refill  . allopurinol (ZYLOPRIM) 300 MG tablet Take 1 tablet (300 mg total) by mouth daily. 30 tablet 3  . cyclobenzaprine (FLEXERIL) 5 MG tablet Take 1 tablet (5 mg total) by mouth 3 (three) times daily as needed for muscle spasms. 60 tablet 0  . fentaNYL (DURAGESIC - DOSED MCG/HR) 25 MCG/HR patch Place 1 patch (25 mcg total) onto the skin every 3 (three) days. 10 patch 0  . hydrocortisone (ANUSOL-HC) 25 MG suppository Place 1 suppository (25 mg total) rectally 2 (two) times daily. 12 suppository 1  . lidocaine-prilocaine (EMLA) cream Apply to affected area once 30 g 3  . metoprolol tartrate (LOPRESSOR) 25 MG tablet Take 25 mg by mouth 2 (two) times daily.  2  . ondansetron (ZOFRAN) 8 MG tablet Take 1 tablet (8 mg total) by mouth 2 (two) times daily as needed for refractory nausea / vomiting. Start on day 3 after cyclophosphamide chemotherapy. 60 tablet 2  . Oxycodone HCl 10 MG TABS Take 1 tablet (10 mg total) by mouth every 6 (six) hours as needed. 90 tablet 0  . senna (SENOKOT) 8.6 MG tablet Take by mouth.    . tamsulosin (FLOMAX)  0.4 MG CAPS capsule Take 0.4 mg by mouth daily. PM    . warfarin (COUMADIN) 5 MG tablet Take 5 mg by mouth daily. Mon and Fri 7.5 mg, all other days 5 mg     No current facility-administered medications for this visit.    Facility-Administered Medications Ordered in Other Visits  Medication Dose Route Frequency Provider Last Rate Last Dose  . sodium chloride flush (NS) 0.9 % injection 10 mL  10 mL Intravenous PRN Lloyd Huger, MD   10 mL at 07/19/17 1050    OBJECTIVE: Vitals:   07/19/17 1112  BP: 135/88  Pulse: 84  Temp: (!) 97 F (36.1 C)     Body mass index is 32.68 kg/m.    ECOG FS:0 - Asymptomatic  General: Well-developed, well-nourished, no acute distress. Eyes: Pink conjunctiva, anicteric sclera. HEENT: Clear oropharynx without exudate or erythema. Lungs: Clear to auscultation bilaterally. Heart:  Regular rate and rhythm. No rubs, murmurs, or gallops. Abdomen: Soft, nontender, nondistended. No organomegaly noted, normoactive bowel sounds. Musculoskeletal: No edema, cyanosis, or clubbing. Neuro: Alert, answering all questions appropriately. Cranial nerves grossly intact. Skin: No rashes or petechiae noted. Psych: Normal affect. Lymphatics: No cervical, calvicular, axillary or inguinal LAD.   LAB RESULTS:  Lab Results  Component Value Date   NA 137 07/19/2017   K 4.1 07/19/2017   CL 106 07/19/2017   CO2 26 07/19/2017   GLUCOSE 93 07/19/2017   BUN 19 07/19/2017   CREATININE 0.82 07/19/2017   CALCIUM 8.8 (L) 07/19/2017   PROT 6.0 (L) 07/19/2017   ALBUMIN 3.5 07/19/2017   AST 27 07/19/2017   ALT 24 07/19/2017   ALKPHOS 72 07/19/2017   BILITOT 0.4 07/19/2017   GFRNONAA >60 07/19/2017   GFRAA >60 07/19/2017    Lab Results  Component Value Date   WBC 5.7 07/19/2017   NEUTROABS 4.0 07/19/2017   HGB 10.6 (L) 07/19/2017   HCT 31.2 (L) 07/19/2017   MCV 91.8 07/19/2017   PLT 220 07/19/2017     STUDIES: Ct Virtual Colonoscopy Diagnostic  Result Date:  07/16/2017 CLINICAL DATA:  Incomplete optical colonoscopy 2014 due to poor prep. EXAM: CT VIRTUAL COLONOSCOPY DIAGNOSTIC TECHNIQUE: The patient was given a standard bowel preparation with Gastrografin and barium for fluid and stool tagging respectively. The quality of the bowel preparation is poor. Automated CO2 insufflation of the colon was performed prior to image acquisition and colonic distention is poor. Image post processing was used to generate a 3D endoluminal fly-through projection of the colon and to electronically subtract stool/fluid as appropriate. COMPARISON:  None. FINDINGS: VIRTUAL COLONOSCOPY The study is nondiagnostic due to an large amount of retained barium and under distention of the colon. The rectum, sigmoid colon and distal descending colon completely collapsed on all positions despite insufflate Ing with several L of air. Distention of the transverse colon and right colon is also suboptimal. I see no fixed annular constricting lesions, but cannot exclude polypoid lesions throughout much of the colon. Virtual colonoscopy is not designed to detect diminutive polyps (i.e., less than or equal to 5 mm), the presence or absence of which may not affect clinical management. CT ABDOMEN AND PELVIS WITHOUT CONTRAST Lower chest: Lung bases are clear. No effusions. Heart is normal size. Hepatobiliary: No focal liver abnormality is seen. Status post cholecystectomy. No biliary dilatation. Pancreas: No focal abnormality or ductal dilatation. Spleen: No focal abnormality.  Normal size. Adrenals/Urinary Tract: No adrenal abnormality. No focal renal abnormality. No stones or hydronephrosis. Urinary bladder is unremarkable. Stomach/Bowel: Stomach and small bowel decompressed, unremarkable. Postoperative changes along the greater curvature of the stomach. Vascular/Lymphatic: No evidence of aneurysm or adenopathy. Reproductive: No free fluid or free air. Other: No free fluid or free air. Musculoskeletal: No  acute bony abnormality IMPRESSION: Nondiagnostic virtual colonoscopy due to large amount of retained barium and nondistention of most of the colon despite multiple attempts CT insufflation with several L of gas. No visible annular constricting lesion. No acute extra colonic abnormality. Electronically Signed   By: Rolm Baptise M.D.   On: 07/16/2017 09:21    ASSESSMENT: Stage III DLBCL.  PLAN:    1. DLBCL: Bone marrow biopsy was negative for lymphoma. Pretreatment MUGA scan on March 16, 2017 reported an EF of 66%. PET scan results from June 07, 2017 reviewed independently with essentially complete resolution of disease. Inititally planned to complete 8 cycles of R-CHOP but treatment was discontinued  after cycle 7 due to side effects. He also received OnPro Neulasta support. Patient is moving back to [place] at the conclusion of his treatments, but wishes to continue his follow-up care in [place]. Return to clinic on August 30, 2017 with repeat imaging, labs, and evaluation on the same day.  2. Atrial fibrillation: Patient underwent RFA on December 21, 2016. He also had a negative nuclear medicine cardiac stress test on January 13, 2017. 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 Patient will require continued cardiac follow-up in [place]. Once patient completes his treatment, his INR will need to be monitored by primary care or cardiology. 3. Pain: continues to have back pain. PET scan from 06/07/17 showed resolution of cancer but back pain persists. Will refill his fentanyl patch today and discussed the need for complete work-up for back pain through orthopedics. He has scheduled this appointment post-move to [place]. Anticipate that pain is not oncological in nature and will be able to be managed through ortho and/or primary care. Will re-visit at his October follow-up appointment.  4. Constipation: Improving. Has seen GI and had a virtual colonoscopy on 07/16/17. He will follow-up with  GI. Reports good results from colace and miralax usage for constipation.  5. Anemia- this is likely secondary to R-CHOP. H&H has improved consistently since discontinuing treatment. Will continue to monitor as patient is asymptomatic today.  6. Nausea: Continue Zofran as prescribed. 7. Neutropenia: Resolved. Last dose of Neulasta was 06/10/17.  8. Mouth sores: resolved   Patient expressed understanding and was in agreement with this plan. He also understands that He can call clinic at any time with any questions, concerns, or complaints.   Cancer Staging Diffuse large B-cell lymphoma of lymph nodes of multiple regions [facility]) Staging form: Hodgkin and Non-Hodgkin Lymphoma, AJCC 8th Edition - Clinical stage from 03/17/2017: Stage III (Diffuse large B-cell lymphoma) - Signed by Lloyd Huger, MD on 03/17/2017  Beverely Risen. Zenia Resides, NP 07/19/17 11:51 AM  Patient was seen and evaluated independently and I agree with the assessment and plan as dictated above.  Lloyd Huger, MD 07/19/17 12:35 PM

## 2017-07-19 ENCOUNTER — Inpatient Hospital Stay (HOSPITAL_BASED_OUTPATIENT_CLINIC_OR_DEPARTMENT_OTHER): Payer: 59 | Admitting: Oncology

## 2017-07-19 ENCOUNTER — Encounter: Payer: Self-pay | Admitting: Oncology

## 2017-07-19 ENCOUNTER — Inpatient Hospital Stay: Payer: 59

## 2017-07-19 VITALS — BP 135/88 | HR 84 | Temp 97.0°F | Wt 275.6 lb

## 2017-07-19 DIAGNOSIS — K5900 Constipation, unspecified: Secondary | ICD-10-CM | POA: Diagnosis not present

## 2017-07-19 DIAGNOSIS — I480 Paroxysmal atrial fibrillation: Secondary | ICD-10-CM | POA: Diagnosis not present

## 2017-07-19 DIAGNOSIS — Z7901 Long term (current) use of anticoagulants: Secondary | ICD-10-CM | POA: Diagnosis not present

## 2017-07-19 DIAGNOSIS — C8338 Diffuse large B-cell lymphoma, lymph nodes of multiple sites: Secondary | ICD-10-CM

## 2017-07-19 DIAGNOSIS — Z79899 Other long term (current) drug therapy: Secondary | ICD-10-CM | POA: Diagnosis not present

## 2017-07-19 DIAGNOSIS — Z95828 Presence of other vascular implants and grafts: Secondary | ICD-10-CM

## 2017-07-19 DIAGNOSIS — R1310 Dysphagia, unspecified: Secondary | ICD-10-CM

## 2017-07-19 LAB — CBC WITH DIFFERENTIAL/PLATELET
BASOS ABS: 0.1 10*3/uL (ref 0–0.1)
Basophils Relative: 2 %
Eosinophils Absolute: 0.5 10*3/uL (ref 0–0.7)
Eosinophils Relative: 8 %
HEMATOCRIT: 31.2 % — AB (ref 40.0–52.0)
HEMOGLOBIN: 10.6 g/dL — AB (ref 13.0–18.0)
LYMPHS PCT: 9 %
Lymphs Abs: 0.5 10*3/uL — ABNORMAL LOW (ref 1.0–3.6)
MCH: 31.2 pg (ref 26.0–34.0)
MCHC: 34 g/dL (ref 32.0–36.0)
MCV: 91.8 fL (ref 80.0–100.0)
MONO ABS: 0.6 10*3/uL (ref 0.2–1.0)
MONOS PCT: 10 %
NEUTROS ABS: 4 10*3/uL (ref 1.4–6.5)
NEUTROS PCT: 71 %
Platelets: 220 10*3/uL (ref 150–440)
RBC: 3.4 MIL/uL — ABNORMAL LOW (ref 4.40–5.90)
RDW: 15.4 % — AB (ref 11.5–14.5)
WBC: 5.7 10*3/uL (ref 3.8–10.6)

## 2017-07-19 LAB — PROTIME-INR
INR: 2.93
PROTHROMBIN TIME: 31.2 s — AB (ref 11.4–15.2)

## 2017-07-19 LAB — COMPREHENSIVE METABOLIC PANEL
ALBUMIN: 3.5 g/dL (ref 3.5–5.0)
ALK PHOS: 72 U/L (ref 38–126)
ALT: 24 U/L (ref 17–63)
AST: 27 U/L (ref 15–41)
Anion gap: 5 (ref 5–15)
BILIRUBIN TOTAL: 0.4 mg/dL (ref 0.3–1.2)
BUN: 19 mg/dL (ref 6–20)
CALCIUM: 8.8 mg/dL — AB (ref 8.9–10.3)
CO2: 26 mmol/L (ref 22–32)
CREATININE: 0.82 mg/dL (ref 0.61–1.24)
Chloride: 106 mmol/L (ref 101–111)
GFR calc Af Amer: >60 mL/min (ref >60)
GLUCOSE: 93 mg/dL (ref 65–99)
POTASSIUM: 4.1 mmol/L (ref 3.5–5.1)
Sodium: 137 mmol/L (ref 135–145)
TOTAL PROTEIN: 6 g/dL — AB (ref 6.5–8.1)

## 2017-07-19 MED ORDER — FENTANYL 25 MCG/HR TD PT72: 25.0000 ug | MEDICATED_PATCH | TRANSDERMAL | 0 refills | Status: DC

## 2017-07-19 MED ORDER — SODIUM CHLORIDE 0.9% FLUSH
10.0000 mL | INTRAVENOUS | Status: AC | PRN
  Administered 2017-07-19: 10 mL via INTRAVENOUS
  Filled 2017-07-19: qty 10

## 2017-07-19 MED ORDER — HEPARIN SOD (PORK) LOCK FLUSH 100 UNIT/ML IV SOLN
500.0000 [IU] | Freq: Once | INTRAVENOUS | Status: AC
  Administered 2017-07-19: 500 [IU] via INTRAVENOUS

## 2017-07-20 ENCOUNTER — Encounter: Payer: Self-pay | Admitting: Physical Therapy

## 2017-07-20 ENCOUNTER — Ambulatory Visit: Payer: 59 | Admitting: Physical Therapy

## 2017-07-20 DIAGNOSIS — M6281 Muscle weakness (generalized): Secondary | ICD-10-CM

## 2017-07-20 DIAGNOSIS — G8929 Other chronic pain: Secondary | ICD-10-CM

## 2017-07-20 DIAGNOSIS — R293 Abnormal posture: Secondary | ICD-10-CM

## 2017-07-20 DIAGNOSIS — M545 Low back pain: Secondary | ICD-10-CM

## 2017-07-20 DIAGNOSIS — R269 Unspecified abnormalities of gait and mobility: Secondary | ICD-10-CM

## 2017-07-20 NOTE — Therapy (Signed)
[facility] [address] [place], [place], [address] Phone: [phone]   Fax:  [phone]  Physical Therapy Treatment  Patient Details  Name: Nicholas Reynolds MRN: [id number] Date of Birth: 06-01-1951 Referring Provider: Delight Hoh, MD  Encounter Date: 07/20/2017      PT End of Session - 07/20/17 0906    Visit Number 5   Number of Visits 8   Date for PT Re-Evaluation 07/27/17   PT Start Time 0859   PT Stop Time 0950   PT Time Calculation (min) 51 min   Activity Tolerance Patient tolerated treatment well;Patient limited by fatigue   Behavior During Therapy [facility] for tasks assessed/performed      Past Medical History:  Diagnosis Date  . Arthritis    thumbs  . Asymptomatic varicose veins of bilateral lower extremities    Uncomfortable but in no ulcers  . Cancer ([place])   . Complication of anesthesia    slow to wake after hernia surg.  was taking "a bunch" of herbals at that time  . Coronary artery disease, non-occlusive July 2008   Nonobstructive by cath   . Family history of premature coronary artery disease    Father  . GERD (gastroesophageal reflux disease)   . 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 Obstructive sleep apnea    severe per study 09/05/14   . Paroxysmal atrial fibrillation ([place]) 2002   a) Initially diagnosed as Lone A Fib while in [place] (on ASA & BB after) - was on warfarin for a couple years and a Flecainide Pill-in Pocket PRN; b) recurrent A. fib with RVR August 2050 - status post cardioversion with flecainide, on low-dose beta blocker  . Pre-syncope May 2015   Unrevealing monitor in May   . Uses hearing aid    bilateral  . Wears dentures    partial upper    Past Surgical History:  Procedure Laterality Date  . CARDIAC CATHETERIZATION  2008   Nonobstructive CAD  . CHOLECYSTECTOMY    . ELBOW SURGERY    . Event monitor  May 2015   Unrevealing  . HERNIA REPAIR    . KNEE ARTHROSCOPY Left 06/28/2015   Procedure: ARTHROSCOPY KNEE WITH SYNOVECTOMY;  Surgeon: Leanor Kail, MD;  Location: [place];  Service: Orthopedics;  Laterality: Left;  CPAP  . [place] RESECTION     December 20 14th  . NM MYOVIEW LTD  June 28, 2014   [facility]: [facility] -- no evidence of ischemia or infarction. Normal EF ~58%  . PORTA CATH INSERTION N/A 03/15/2017   Procedure: Glori Luis Cath Insertion;  Surgeon: Algernon Huxley, MD;  Location: [place] [facility];  Service: Cardiovascular;  Laterality: N/A;  . RADIOFREQUENCY ABLATION of Ventricular Tachycardia  July 2000 the   RVOT VT ablation: [facility] [place]    . TRANSTHORACIC ECHOCARDIOGRAM  May 2015   [facility] - Dr. Sharyn Lull: EF 55%, mild LVH; otherwise mostly normal    There were no vitals filed for this visit.      Subjective Assessment - 07/20/17 0903    Subjective Pt. states he is getting more active but  having increase back pain with bending/ moving (6/10 currently).     Limitations Lifting;Standing;Walking;House hold activities   Patient Stated Goals Progress standing tolerance/ Generalized muscle strength/ decrease back pain.    Currently in Pain? Yes   Pain Score 6    Pain Location Back   Pain Orientation Lower   Pain Descriptors / Indicators Aching   Pain Type Chronic pain   Pain Onset More than a month ago      Ther.ex.:  Scifit L810 min (B UE/LE)- warm-up/ no charge. Standing Nautilus: 30# lat. Pull downs/ 30# tricep ext./ 30# chest press 20x each. High marching/ walking in //-bars with no UE assist.   BOSU step ups/ downs with light to no UE  assist (forward/ lateral)- moderate cuing for posture. Step touches/ up/ over with no UE assist. Resisted gait 1BTB 8x all 4-planes.  Reviewed HEP.        PT Long Term Goals - 06/30/17 1328      PT LONG TERM GOAL #1   Title Pt. independent with HEP to increase B LE muscle strength to grossly 4+/5 MMT to improve standing tolerance/ mobility.    Baseline B LE muscle strength grossly 4/5 MMT except quads 4+/5 MMT   Time 4   Period Weeks   Status New   Target Date 07/27/17     PT LONG TERM GOAL #2   Title Pt. will decrease MODI to <30% to improve pain-free functional mobility.    Baseline MODI: 48% on 8/7   Time 4   Period Weeks   Status New   Target Date 07/27/17     PT LONG TERM GOAL #3   Title Pt. able to ambulate 10 minutes with no rest breaks to improve overall functional mobility.    Baseline significant limitiations with standing tolerance/ walking due to fatigue and dizziness.    Time 4   Period Weeks   Status New   Target Date 07/27/17     PT LONG TERM GOAL #4   Title Pt. able to stand from chair with c/o dizziness or back pain to improve daily ADLS.   Baseline pain limited with standing.  Increase dizziness   Time 4   Period Weeks   Status New   Target Date 07/27/17            Plan - 07/20/17 0906    Clinical Impression Statement Pt. able to complete standing/ walking tasks with only 1 episode of dizziness/ fatigue reported.  Pt. c/o LBP t/o tx. session and PT educated pt. on proper posture/ body mechanics.  Pt. has 1 more PT tx. session prior to being discharged due to moving back to [place].     Clinical Presentation Stable   Clinical Decision Making Moderate   Rehab Potential Good   PT Frequency 2x / week   PT Duration 4 weeks   PT Treatment/Interventions ADLs/Self Care Home Management;Aquatic Therapy;Cryotherapy;Moist Heat;Gait training;Stair training;Functional mobility training;Therapeutic activities;Therapeutic exercise;Balance  training;Patient/family education;Neuromuscular re-education   PT Next Visit Plan progress HEP/ CHECK GOALS for discharge next tx. session   PT Home Exercise Plan see handouts   Consulted and Agree with Plan of Care Patient      Patient will benefit from skilled therapeutic intervention in order to improve the following deficits and impairments:  Abnormal gait, Improper body mechanics, Pain, Cardiopulmonary status limiting activity, Decreased mobility, Postural dysfunction, Decreased strength, Decreased range of motion, Decreased endurance, Decreased activity tolerance, Decreased balance, Difficulty walking, Impaired flexibility  Visit  Diagnosis: Muscle weakness (generalized)  Gait difficulty  Abnormal posture  Chronic midline low back pain without sciatica     Problem List Patient Active Problem List   Diagnosis Date Noted  . Hypotension 06/16/2017  . Rectal prolapse 06/16/2017  . Abdominal pain 06/16/2017  . Arthritis of knee 04/15/2017  . Goals of care, counseling/discussion 03/15/2017  . 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[place]) 03/15/2017  . Chronic back pain 03/09/2017  . Gastroesophageal reflux 03/09/2017  . Morbid obesity ([place]) 03/09/2017  . Peripheral vascular disease ([place]) 03/09/2017  . 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 Ventricular tachycardia ([place]) 03/09/2017  . 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 Atrial tachycardia ([place]) 09/09/2016  . Near syncope 09/09/2016  . Osteoarthritis of knee 08/11/2016  . Sleep apnea   . Family history of premature coronary artery disease   . Pigmented villonodular synovitis of left knee 07/09/2015  . Strain of left quadriceps tendon 04/18/2015  . Traumatic arthritis of knee, left 04/18/2015  . Pain in right knee 03/07/2015  . Obesity (BMI 30-39.9) 12/05/2014  . Orthostatic dizziness 12/05/2014  . Trigger finger of left thumb 09/06/2014  . Atrial fibrillation ([place]) 07/15/2014  . Varicose veins of lower  extremity with edema 07/15/2014  . Fatigue 07/15/2014  . Hearing loss, sensorineural 04/26/2014  . Palpitations 04/06/2014  . 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 Benign non-nodular prostatic hyperplasia with lower urinary tract symptoms 03/10/2013  . Venous insufficiency 11/17/2012  . 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 H/O ventricular tachycardia 05/24/2007  . Coronary artery disease, non-occlusive 05/24/2007   Pura Spice, PT, DPT # [phone] 07/20/2017, 2:41 PM  [facility] [address] [place], [place], [address] Phone: [phone]   Fax:  [phone]  Name: Nicholas Reynolds MRN: [id number] Date of Birth: Feb 09, 1951

## 2017-07-22 ENCOUNTER — Ambulatory Visit: Payer: 59 | Admitting: Physical Therapy

## 2017-07-22 DIAGNOSIS — R269 Unspecified abnormalities of gait and mobility: Secondary | ICD-10-CM

## 2017-07-22 DIAGNOSIS — R293 Abnormal posture: Secondary | ICD-10-CM

## 2017-07-22 DIAGNOSIS — M545 Low back pain, unspecified: Secondary | ICD-10-CM

## 2017-07-22 DIAGNOSIS — M6281 Muscle weakness (generalized): Secondary | ICD-10-CM

## 2017-07-22 DIAGNOSIS — G8929 Other chronic pain: Secondary | ICD-10-CM

## 2017-07-23 NOTE — Therapy (Signed)
[facility] [address] [place], [place], [address] Phone: [phone]   Fax:  [phone]  Physical Therapy Treatment  Patient Details  Name: Nicholas Reynolds MRN: [id number] Date of Birth: 1951/11/19 Referring Provider: Delight Hoh, MD  Encounter Date: 07/22/2017      PT End of Session - 07/23/17 1225    Visit Number 6   Number of Visits 8   Date for PT Re-Evaluation 07/27/17   PT Start Time 0859   PT Stop Time 0949   PT Time Calculation (min) 50 min   Activity Tolerance Patient tolerated treatment well;Patient limited by fatigue   Behavior During Therapy [facility] for tasks assessed/performed      Past Medical History:  Diagnosis Date  . Arthritis    thumbs  . Asymptomatic varicose veins of bilateral lower extremities    Uncomfortable but in no ulcers  . Cancer ([place])   . Complication of anesthesia    slow to wake after hernia surg.  was taking "a bunch" of herbals at that time  . Coronary artery disease, non-occlusive July 2008   Nonobstructive by cath   . Family history of premature coronary artery disease    Father  . GERD (gastroesophageal reflux disease)   . 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 Obstructive sleep apnea    severe per study 09/05/14   . Paroxysmal atrial fibrillation ([place]) 2002   a) Initially diagnosed as [place] Fib while in [place] (on ASA & BB after) - was on warfarin for a couple years and a Flecainide Pill-in Pocket PRN; b) recurrent A. fib with RVR August 2050 - status post cardioversion with flecainide, on low-dose beta blocker  . Pre-syncope May 2015   Unrevealing monitor in May   . Uses hearing aid    bilateral  . Wears dentures    partial upper    Past Surgical History:  Procedure Laterality Date  . CARDIAC CATHETERIZATION  2008   Nonobstructive CAD  . CHOLECYSTECTOMY    . ELBOW SURGERY    . Event monitor  May 2015   Unrevealing  . HERNIA REPAIR    . KNEE ARTHROSCOPY Left 06/28/2015   Procedure: ARTHROSCOPY KNEE WITH SYNOVECTOMY;  Surgeon: Leanor Kail, MD;  Location: [place];  Service: Orthopedics;  Laterality: Left;  CPAP  . [place] RESECTION     December 20 14th  . NM MYOVIEW LTD  June 28, 2014   [facility]: [facility] -- no evidence of ischemia or infarction. Normal EF ~58%  . PORTA CATH INSERTION N/A 03/15/2017   Procedure: Glori Luis Cath Insertion;  Surgeon: Algernon Huxley, MD;  Location: [place] CV [facility];  Service: Cardiovascular;  Laterality: N/A;  . RADIOFREQUENCY ABLATION of Ventricular Tachycardia  July 2000 the   RVOT VT ablation: [facility], [place]    . TRANSTHORACIC ECHOCARDIOGRAM  May 2015   [facility] - Dr. Sharyn Lull: EF 55%, mild LVH; otherwise mostly normal    There were no vitals filed for this visit.      Subjective Assessment - 07/23/17 1222    Subjective Pt. states back pain is still present and  was really sore in B LE muscles after last tx. session.  Pt. reports he understands HEP.     Limitations Lifting;Standing;Walking;House hold activities   Patient Stated Goals Progress standing tolerance/ Generalized muscle strength/ decrease back pain.    Currently in Pain? Yes   Pain Score 5    Pain Location Back   Pain Orientation Lower   Pain Descriptors / Indicators Aching   Multiple Pain Sites Yes   Pain Score 6   Pain Location Knee   Pain Orientation Right   Pain Descriptors / Indicators Aching   Pain Type Chronic pain      Ther.ex.:  Supine knee to chest/ hamstring stretches/ bridging with bolster 20x each Seated LAQ/ alt. UE and LE  15x Moderate hypomobility noted in R knee joint.   High marching/ walking in //-bars with no UE assist.   Step touches/ up/ over with no UE assist. Resisted gait 1BTB 10x all 4-planes.  Discussed benefits of continued HEP and gym based ex.          PT Long Term Goals - 07/23/17 1237      PT LONG TERM GOAL #1   Title Pt. independent with HEP to increase B LE muscle strength to grossly 4+/5 MMT to improve standing tolerance/ mobility.    Baseline B LE strength grossly 4+/5 MMT   Time 4   Period Weeks   Status Achieved     PT LONG TERM GOAL #2   Title Pt. will decrease MODI to <30% to improve pain-free functional mobility.    Baseline MODI: 48% on 8/7   Time 4   Period Weeks   Status Unable to assess     PT LONG TERM GOAL #3   Title Pt. able to ambulate 10 minutes with no rest breaks to improve overall functional mobility.    Baseline marked improvement in walking endurance   Time 4   Period Weeks   Status Achieved     PT LONG TERM GOAL #4   Title Pt. able to stand from chair with c/o dizziness or back pain to improve daily ADLS.   Baseline marked improvement in standing tolerance   Time 4   Period Weeks   Status Achieved            Plan - 07/23/17 1226    Clinical Impression Statement Discharge from PT at this time.  Pt. has shown consistent progress with skilled PT services and tolerating standing therex.  tasks.  Moderate knee pain with flexion/ SLS wt. bearing tasks.  Pt. is candidate for TKA but waiting until next year.  Pt. understands HEP and will continue with daily routine after he returns to [place] next week.     Clinical Presentation Stable   Clinical Decision Making Moderate   Rehab Potential Good   PT Frequency 2x / week   PT Duration 4 weeks   PT Treatment/Interventions ADLs/Self Care Home Management;Aquatic Therapy;Cryotherapy;Moist Heat;Gait training;Stair training;Functional mobility training;Therapeutic activities;Therapeutic exercise;Balance  training;Patient/family education;Neuromuscular re-education   PT Next Visit Plan Discharge visit.  Pt. moving back to [place] to return to work.     PT Home Exercise Plan see handouts   Consulted and Agree with Plan of Care Patient      Patient will benefit from skilled therapeutic intervention in order to improve the following deficits and impairments:  Abnormal gait, Improper body mechanics, Pain, Cardiopulmonary status limiting activity, Decreased mobility, Postural dysfunction, Decreased strength, Decreased range of motion, Decreased endurance, Decreased activity  tolerance, Decreased balance, Difficulty walking, Impaired flexibility  Visit Diagnosis: Muscle weakness (generalized)  Gait difficulty  Abnormal posture  Chronic midline low back pain without sciatica     Problem List Patient Active Problem List   Diagnosis Date Noted  . Hypotension 06/16/2017  . Rectal prolapse 06/16/2017  . Abdominal pain 06/16/2017  . Arthritis of knee 04/15/2017  . Goals of care, counseling/discussion 03/15/2017  . 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[place]) 03/15/2017  . Chronic back pain 03/09/2017  . Gastroesophageal reflux 03/09/2017  . Morbid obesity ([place]) 03/09/2017  . Peripheral vascular disease ([place]) 03/09/2017  . 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 Ventricular tachycardia ([place]) 03/09/2017  . 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 Atrial tachycardia ([place]) 09/09/2016  . Near syncope 09/09/2016  . Osteoarthritis of knee 08/11/2016  . Sleep apnea   . Family history of premature coronary artery disease   . Pigmented villonodular synovitis of left knee 07/09/2015  . Strain of left quadriceps tendon 04/18/2015  . Traumatic arthritis of knee, left 04/18/2015  . Pain in right knee 03/07/2015  . Obesity (BMI 30-39.9) 12/05/2014  . Orthostatic dizziness 12/05/2014  . Trigger finger of left thumb 09/06/2014  . Atrial fibrillation ([place]) 07/15/2014  . Varicose veins  of lower extremity with edema 07/15/2014  . Fatigue 07/15/2014  . Hearing loss, sensorineural 04/26/2014  . Palpitations 04/06/2014  . 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 Benign non-nodular prostatic hyperplasia with lower urinary tract symptoms 03/10/2013  . Venous insufficiency 11/17/2012  . 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 H/O ventricular tachycardia 05/24/2007  . Coronary artery disease, non-occlusive 05/24/2007   Pura Spice, PT, DPT # [phone] 07/23/2017, 12:47 PM  [facility] [place] [address] [place], [place], [address] Phone: [phone]   Fax:  [phone]  Name: Nicholas Reynolds MRN: [id number] Date of Birth: 10/14/1951

## 2017-08-26 ENCOUNTER — Other Ambulatory Visit: Payer: Self-pay | Admitting: *Deleted

## 2017-08-26 DIAGNOSIS — C8330 Diffuse large B-cell lymphoma, unspecified site: Secondary | ICD-10-CM

## 2017-08-28 ENCOUNTER — Encounter (HOSPITAL_COMMUNITY)
Admission: RE | Admit: 2017-08-28 | Discharge: 2017-08-28 | Disposition: A | Discharge status: Home / Self Care | Payer: 59 | Source: Ambulatory Visit | Attending: Oncology | Admitting: Oncology

## 2017-08-28 DIAGNOSIS — C8330 Diffuse large B-cell lymphoma, unspecified site: Secondary | ICD-10-CM | POA: Diagnosis present

## 2017-08-28 LAB — GLUCOSE, CAPILLARY: GLUCOSE-CAPILLARY: 96 mg/dL (ref 65–99)

## 2017-08-28 MED ORDER — FLUDEOXYGLUCOSE F - 18 (FDG) INJECTION
13.1000 | Freq: Once | INTRAVENOUS | Status: AC | PRN
  Administered 2017-08-28: 13.1 via INTRAVENOUS

## 2017-08-29 NOTE — Progress Notes (Signed)
Nicholas Reynolds  [phone] [phone]  ID: Nicholas Reynolds OB: Dec 09, 1950  MR#: [id number]  OMB#:[id number]  Patient Care Team: Valera Castle, MD as PCP - General  CHIEF COMPLAINT: Stage III DLBCL.  INTERVAL HISTORY: Patient returns to clinic today for further evaluation and discussion of his imaging results. He currently feels well and is nearly back to his baseline. He continues to have occasional mouth pain particularly with eating acidic foods. He has no neurologic complaints. He has a good appetite and denies weight loss. He denies any recent fevers or illnesses. He has no neurologic complaints. He denies any chest pain, shortness of breath, cough, or hemoptysis. He has no nausea, vomiting, constipation, or diarrhea. He has no urinary complaints. Patient offers no further specific complaints today.  REVIEW OF SYSTEMS:   Review of Systems  Constitutional: Negative for fever, malaise/fatigue and weight loss.  Respiratory: Negative for cough, hemoptysis and shortness of breath.   Cardiovascular: Negative for chest pain and leg swelling.  Gastrointestinal: Negative.  Negative for abdominal pain, blood in stool, constipation, diarrhea, melena, nausea and vomiting.  Genitourinary: Negative.   Musculoskeletal: Negative.  Negative for back pain.  Neurological: Negative.  Negative for sensory change and weakness.  Psychiatric/Behavioral: Negative.  The patient is not nervous/anxious and does not have insomnia.     As per HPI. Otherwise, a complete review of systems is negative.  PAST MEDICAL HISTORY: Past Medical History:  Diagnosis Date  . Arthritis    thumbs  . Asymptomatic varicose veins of bilateral lower extremities    Uncomfortable but in no ulcers  . Cancer ([place])   . Complication of anesthesia    slow to wake after hernia surg.  was taking "a bunch" of herbals at that time  . Coronary artery disease, non-occlusive July 2008   Nonobstructive by cath   . Family history of premature coronary artery disease    Father  . GERD (gastroesophageal reflux disease)   . 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 Obstructive sleep apnea    severe per study 09/05/14   . Paroxysmal atrial fibrillation ([place]) 2002   a) Initially diagnosed as [place] A Fib while in [place] (on ASA & BB after) - was on warfarin for a couple years and a Flecainide Pill-in Pocket PRN; b) recurrent A. fib with RVR August 2050 - status post cardioversion with flecainide, on low-dose beta blocker  . Pre-syncope May 2015   Unrevealing monitor in May  . Uses hearing aid    bilateral  . Wears dentures    partial upper    PAST SURGICAL HISTORY: Past Surgical History:  Procedure Laterality Date  . CARDIAC CATHETERIZATION  2008   Nonobstructive CAD  . CHOLECYSTECTOMY    . ELBOW SURGERY    . Event monitor  May 2015   Unrevealing  . HERNIA REPAIR    . KNEE ARTHROSCOPY Left 06/28/2015   Procedure: ARTHROSCOPY KNEE WITH SYNOVECTOMY;  Surgeon: Leanor Kail, MD;  Location: [place];  Service: Orthopedics;  Laterality: Left;  CPAP  . [place] RESECTION     December 20 14th  . NM MYOVIEW LTD  June 28, 2014   [facility]: [facility] --  no evidence of ischemia or infarction. Normal EF ~58%  . PORTA CATH INSERTION N/A 03/15/2017   Procedure: Glori Luis Cath Insertion;  Surgeon: Algernon Huxley, MD;  Location: [place] CV [facility];  Service: Cardiovascular;  Laterality: N/A;  . RADIOFREQUENCY ABLATION of Ventricular Tachycardia  July 2000 the   RVOT VT ablation: [facility], [place]    .  TRANSTHORACIC ECHOCARDIOGRAM  May 2015   [facility] - Dr. Sharyn Lull: EF 55%, mild LVH; otherwise mostly normal    FAMILY HISTORY: Family History  Problem Relation Age of Onset  . Breast cancer Mother   . Bone cancer Mother   . COPD Father   . Arrhythmia Father   . Heart failure Father   . Heart attack Father     ADVANCED DIRECTIVES (Y/N):  N  HEALTH MAINTENANCE: Social History  Substance Use Topics  . Smoking status: Former Smoker    Quit date: 11/24/1999  . Smokeless tobacco: Never Used  . Alcohol use No     Colonoscopy:  PAP:  Bone density:  Lipid panel:  No Known Allergies  Current Outpatient Prescriptions  Medication Sig Dispense Refill  . lidocaine-prilocaine (EMLA) cream Apply to affected area once 30 g 3  . LINZESS 290 MCG CAPS capsule   11  . metoprolol succinate (TOPROL-XL) 50 MG 24 hr tablet Take 50 mg by mouth daily. Take with or immediately following a meal.    . tamsulosin (FLOMAX) 0.4 MG CAPS capsule Take 0.4 mg by mouth daily. PM    . warfarin (COUMADIN) 5 MG tablet Take 5 mg by mouth daily. Mon and Fri 7.5 mg, all other days 5 mg    . allopurinol (ZYLOPRIM) 300 MG tablet Take 1 tablet (300 mg total) by mouth daily. (Patient not taking: Reported on 08/30/2017) 30 tablet 3  . cyclobenzaprine (FLEXERIL) 5 MG tablet Take 1 tablet (5 mg total) by mouth 3 (three) times daily as needed for muscle spasms. (Patient not taking: Reported on 08/30/2017) 60 tablet 0  . fentaNYL (DURAGESIC - DOSED MCG/HR) 25 MCG/HR patch Place 1 patch (25 mcg total) onto the skin every 3 (three) days. (Patient not taking: Reported on 08/30/2017) 10 patch 0  . hydrocortisone (ANUSOL-HC) 25 MG suppository Place 1 suppository (25 mg total) rectally 2 (two) times daily. (Patient not taking: Reported on 08/30/2017) 12 suppository 1  . ondansetron (ZOFRAN) 8 MG tablet Take 1 tablet (8 mg total) by mouth 2 (two) times daily as needed for refractory nausea / vomiting. Start on day 3 after  cyclophosphamide chemotherapy. (Patient not taking: Reported on 08/30/2017) 60 tablet 2  . Oxycodone HCl 10 MG TABS Take 1 tablet (10 mg total) by mouth every 6 (six) hours as needed. (Patient not taking: Reported on 08/30/2017) 90 tablet 0  . senna (SENOKOT) 8.6 MG tablet Take by mouth.     No current facility-administered medications for this visit.    Facility-Administered Medications Ordered in Other Visits  Medication Dose Route Frequency Provider Last Rate Last Dose  . sodium chloride flush (NS) 0.9 % injection 10 mL  10 mL Intravenous PRN Lloyd Huger, MD   10 mL at 07/19/17 1050    OBJECTIVE: Vitals:   08/30/17 1418  BP: 115/83  Pulse: 94  Resp: 18  Temp: (!) 96.5 F (35.8 C)     Body mass index is 33.7 kg/m.    ECOG FS:0 - Asymptomatic  General: Well-developed, well-nourished, no acute distress.  Eyes: Pink conjunctiva, anicteric sclera. HEENT: Clear oropharynx without exudate or erythema. Lungs: Clear to auscultation bilaterally. Heart: Regular rate and rhythm. No rubs, murmurs, or gallops. Abdomen: Soft, nontender, nondistended. No organomegaly noted, normoactive bowel sounds. Musculoskeletal: No edema, cyanosis, or clubbing. Neuro: Alert, answering all questions appropriately. Cranial nerves grossly intact. Skin: No rashes or petechiae noted. Psych: Normal affect. Lymphatics: No cervical, calvicular, axillary or inguinal LAD.   LAB RESULTS:  Lab Results  Component Value Date   NA 140 08/30/2017   K 4.4 08/30/2017   CL 105 08/30/2017   CO2 26 08/30/2017   GLUCOSE 91 08/30/2017   BUN 20 08/30/2017   CREATININE 1.03 08/30/2017   CALCIUM 9.0 08/30/2017   PROT 6.3 (L) 08/30/2017   ALBUMIN 3.6 08/30/2017   AST 26 08/30/2017   ALT 23 08/30/2017   ALKPHOS 95 08/30/2017   BILITOT 0.3 08/30/2017   GFRNONAA >60 08/30/2017   GFRAA >60 08/30/2017    Lab Results  Component Value Date   WBC 3.4 (L) 08/30/2017   NEUTROABS 2.1 08/30/2017   HGB 13.1  08/30/2017   HCT 39.0 (L) 08/30/2017   MCV 89.1 08/30/2017   PLT 207 08/30/2017     STUDIES: Nm Pet Image Restag (ps) Skull Base To Thigh  Result Date: 08/28/2017 CLINICAL DATA:  Subsequent treatment strategy for large B-cell lymphoma. EXAM: NUCLEAR MEDICINE PET SKULL BASE TO THIGH TECHNIQUE: 13.1 mCi F-18 FDG was injected intravenously. Full-ring PET imaging was performed from the skull base to thigh after the radiotracer. CT data was obtained and used for attenuation correction and anatomic localization. FASTING BLOOD GLUCOSE:  Value: 96 mg/dl COMPARISON:  No prior PET-CT.  CT the abdomen and pelvis 07/16/2017. FINDINGS: NECK: No hypermetabolic lymph nodes in the neck. CHEST: No hypermetabolic mediastinal or hilar nodes. No suspicious pulmonary nodules on the CT scan. Right internal jugular single-lumen porta cath with tip terminating in the right atrium. ABDOMEN/PELVIS: No abnormal hypermetabolic activity within the liver, pancreas, adrenal glands, or spleen. No hypermetabolic lymph nodes in the abdomen or pelvis. Status post cholecystectomy. Aortic atherosclerosis. Postoperative changes of sleeve gastrectomy. SKELETON: There are a few focal areas of low-level hypermetabolism in the skeleton, including L2 superior endplate at the site of a Schmorl's node, tip of the spinous, and in the L4-L5 intervertebral disc space process of L4, all of which are favored to be physiologic. No other focal hypermetabolic activity to suggest skeletal metastasis. IMPRESSION: 1. No evidence of residual or recurrent disease in the neck, chest, abdomen or pelvis. 2.  Aortic Atherosclerosis (ICD10-I70.0). 3. Additional incidental findings, as above. Electronically Signed   By: Vinnie Langton M.D.   On: 08/28/2017 16:07    ASSESSMENT: Stage III DLBCL.  PLAN:    1. DLBCL: Bone marrow biopsy was negative for lymphoma. Patient completed cycle 7 of RCHOP chemotherapy on June 10, 2017. Given declining performance status,  patient did not complete cycle 8. PET scan results from August 28, 2017 reviewed independently and reported as above with a complete metabolic response. Patient now lives in [place] full-time. He will return to clinic in 6 months with repeat imaging with CT scan, laboratory work, and evaluation all on the same day.  2. Atrial fibrillation: Patient underwent RFA on December 21, 2016. He also had a negative nuclear medicine cardiac stress test on January 13, 2017. 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 Continue INR monitoring by primary care in [place].  3. Pain: Improved. Patient does not complain of  this today. 4. Constipation: Continue Colace and MiraLAX as needed.  5. Nausea: Resolved.  Patient expressed understanding and was in agreement with this plan. He also understands that He can call clinic at any time with any questions, concerns, or complaints.   Cancer Staging Diffuse large B-cell lymphoma of lymph nodes of multiple regions [facility]) Staging form: Hodgkin and Non-Hodgkin Lymphoma, AJCC 8th Edition - Clinical stage from 03/17/2017: Stage III (Diffuse large B-cell lymphoma) - Signed by Lloyd Huger, MD on 03/17/2017   Lloyd Huger, MD   08/30/2017 3:18 PM

## 2017-08-30 ENCOUNTER — Telehealth: Payer: Self-pay | Admitting: Oncology

## 2017-08-30 ENCOUNTER — Inpatient Hospital Stay: Payer: 59

## 2017-08-30 ENCOUNTER — Inpatient Hospital Stay: Payer: 59 | Attending: Oncology | Admitting: Oncology

## 2017-08-30 ENCOUNTER — Ambulatory Visit: Payer: 59

## 2017-08-30 VITALS — BP 115/83 | HR 94 | Temp 96.5°F | Resp 18 | Wt 284.2 lb

## 2017-08-30 DIAGNOSIS — G4733 Obstructive sleep apnea (adult) (pediatric): Secondary | ICD-10-CM | POA: Insufficient documentation

## 2017-08-30 DIAGNOSIS — I700 Atherosclerosis of aorta: Secondary | ICD-10-CM | POA: Diagnosis not present

## 2017-08-30 DIAGNOSIS — C8338 Diffuse large B-cell lymphoma, lymph nodes of multiple sites: Secondary | ICD-10-CM

## 2017-08-30 DIAGNOSIS — I2510 Atherosclerotic heart disease of native coronary artery without angina pectoris: Secondary | ICD-10-CM | POA: Insufficient documentation

## 2017-08-30 DIAGNOSIS — Z87891 Personal history of nicotine dependence: Secondary | ICD-10-CM | POA: Insufficient documentation

## 2017-08-30 DIAGNOSIS — Z79899 Other long term (current) drug therapy: Secondary | ICD-10-CM | POA: Insufficient documentation

## 2017-08-30 DIAGNOSIS — Z7901 Long term (current) use of anticoagulants: Secondary | ICD-10-CM | POA: Diagnosis not present

## 2017-08-30 DIAGNOSIS — K5900 Constipation, unspecified: Secondary | ICD-10-CM | POA: Insufficient documentation

## 2017-08-30 DIAGNOSIS — I4891 Unspecified atrial fibrillation: Secondary | ICD-10-CM | POA: Diagnosis not present

## 2017-08-30 DIAGNOSIS — K219 Gastro-esophageal reflux disease without esophagitis: Secondary | ICD-10-CM | POA: Insufficient documentation

## 2017-08-30 LAB — CBC WITH DIFFERENTIAL/PLATELET
BASOS PCT: 2 %
Basophils Absolute: 0.1 10*3/uL (ref 0–0.1)
EOS ABS: 0.1 10*3/uL (ref 0–0.7)
EOS PCT: 4 %
HCT: 39 % — ABNORMAL LOW (ref 40.0–52.0)
Hemoglobin: 13.1 g/dL (ref 13.0–18.0)
Lymphocytes Relative: 18 %
Lymphs Abs: 0.6 10*3/uL — ABNORMAL LOW (ref 1.0–3.6)
MCH: 29.9 pg (ref 26.0–34.0)
MCHC: 33.5 g/dL (ref 32.0–36.0)
MCV: 89.1 fL (ref 80.0–100.0)
MONO ABS: 0.5 10*3/uL (ref 0.2–1.0)
MONOS PCT: 15 %
Neutro Abs: 2.1 10*3/uL (ref 1.4–6.5)
Neutrophils Relative %: 61 %
Platelets: 207 10*3/uL (ref 150–440)
RBC: 4.38 MIL/uL — ABNORMAL LOW (ref 4.40–5.90)
RDW: 14.1 % (ref 11.5–14.5)
WBC: 3.4 10*3/uL — ABNORMAL LOW (ref 3.8–10.6)

## 2017-08-30 LAB — COMPREHENSIVE METABOLIC PANEL
ALBUMIN: 3.6 g/dL (ref 3.5–5.0)
ALT: 23 U/L (ref 17–63)
ANION GAP: 9 (ref 5–15)
AST: 26 U/L (ref 15–41)
Alkaline Phosphatase: 95 U/L (ref 38–126)
BUN: 20 mg/dL (ref 6–20)
CO2: 26 mmol/L (ref 22–32)
Calcium: 9 mg/dL (ref 8.9–10.3)
Chloride: 105 mmol/L (ref 101–111)
Creatinine, Ser: 1.03 mg/dL (ref 0.61–1.24)
GFR calc non Af Amer: >60 mL/min (ref >60)
GLUCOSE: 91 mg/dL (ref 65–99)
POTASSIUM: 4.4 mmol/L (ref 3.5–5.1)
SODIUM: 140 mmol/L (ref 135–145)
TOTAL PROTEIN: 6.3 g/dL — AB (ref 6.5–8.1)
Total Bilirubin: 0.3 mg/dL (ref 0.3–1.2)

## 2017-08-30 NOTE — Progress Notes (Signed)
Patient denies any concerns today, here for PET results.

## 2017-08-30 NOTE — Telephone Encounter (Signed)
Ordered CT for Chest/Abdomen/Pelvis with Contrast. Appt conf with patient for 02/28/17 at 9 am for CT at [facility], with Labs here (required prior to CT, per central Radiology Scheduler/Kennisha) at 8 a.m. Patient instructed to pick up Oral Contrast prior to CT appt date.   Please note that Katrine Coho made a mistake and scheduled CT Neck. Einar Crow of what she ordered and she said that she will unlink it and correct the CT. (patient is aware)

## 2017-09-06 ENCOUNTER — Encounter: Payer: Self-pay | Admitting: *Deleted

## 2017-09-30 ENCOUNTER — Telehealth: Payer: Self-pay | Admitting: Oncology

## 2017-09-30 NOTE — Telephone Encounter (Signed)
Spoke to pt's wife in regards to medical records request to send records to physician in [facility]. Advised that we would need to have pt fill out [place] form. Walked her through steps on how to download form from [facility] website and gave fax number where it can be sent to for request to be fulfilled. She was happy with this

## 2018-02-15 ENCOUNTER — Other Ambulatory Visit: Payer: Self-pay | Admitting: Oncology

## 2018-02-15 DIAGNOSIS — C8338 Diffuse large B-cell lymphoma, lymph nodes of multiple sites: Secondary | ICD-10-CM

## 2018-02-28 ENCOUNTER — Ambulatory Visit: Payer: 59

## 2018-02-28 ENCOUNTER — Ambulatory Visit: Payer: 59 | Admitting: Oncology

## 2018-02-28 ENCOUNTER — Other Ambulatory Visit: Payer: 59

## 2018-09-14 IMAGING — PT NM PET TUM IMG INITIAL (PI) SKULL BASE T - THIGH
1 of 8 series · 1 of 25 positions shown · non-contrast
Comparison: CT chest 02/19/2017 and 02/12/2011.

CLINICAL DATA: Initial treatment strategy for lung mass.

EXAM:
NUCLEAR MEDICINE PET SKULL BASE TO THIGH
TECHNIQUE: 12.3 mCi F-18 FDG was injected intravenously. Full-ring PET imaging
was performed from the skull base to thigh after the radiotracer. CT
data was obtained and used for attenuation correction and anatomic
localization.
FASTING BLOOD GLUCOSE:  Value: 103 mg/dl

[Series 3: ct wb 5.0 b30f · axial · 5.0mm · 0.98mm/px · 1 of 368 slices shown]
[im 368/368  brain]
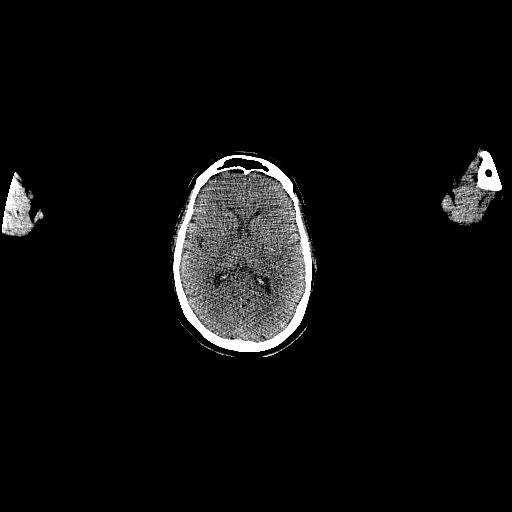

[1 of 25 positions shown; findings below may reference images not displayed]

FINDINGS: NECK

No hypermetabolic lymph nodes in the neck. CT images show no acute
findings.

CHEST

Hypermetabolic extrapleural nodules are seen bilaterally, right
greater than left. Largest nodule on the right measures 2.2 x 2.9 cm
(CT image 126) and is contiguous with nodularity seen inferiorly,
with an overall SUV max of 34.3. No hypermetabolic mediastinal,
hilar or axillary lymph nodes. No hypermetabolic pulmonary nodules.
Heart is enlarged. No pericardial or pleural effusion.

ABDOMEN/PELVIS

Note is made of misregistration artifact in the abdomen. No abnormal
hypermetabolism in the liver, adrenal glands, spleen or pancreas.
There are scattered hypermetabolic peritoneal nodules with an index
nodule in the right lower quadrant measuring 1.7 cm (CT image 213)
and SUV max of 20.8. Hypermetabolic left periaortic lymph node is
seen just above the bifurcation, measuring 2.6 x 2.8 cm with an SUV
max of 28.1. Right inguinal lymph node measures 10 mm (CT image 305)
with an SUV max of 7.0. 8 mm subcutaneous nodule along the right
lateral ventral abdominal wall (CT image 218) is mildly
hypermetabolic. Liver, adrenal glands, kidneys, spleen and pancreas
are grossly unremarkable. Cholecystectomy. Postoperative changes in
the stomach. Bowel is grossly unremarkable.

SKELETON

No abnormal osseous hypermetabolism.
IMPRESSION: 1. Hypermetabolic extrapleural lesions, peritoneal nodules and
retroperitoneal/right inguinal lymph nodes, worrisome for lymphoma.
2. Mildly hypermetabolic subcutaneous nodule along the lower right
lateral abdominal wall, nonspecific. Continued attention on followup
exams is warranted.

## 2018-10-29 ENCOUNTER — Other Ambulatory Visit: Payer: Self-pay | Admitting: Nurse Practitioner

## 2018-12-17 ENCOUNTER — Other Ambulatory Visit: Payer: Self-pay | Admitting: Nurse Practitioner

## 2019-05-16 DIAGNOSIS — H25011 Cortical age-related cataract, right eye: Secondary | ICD-10-CM | POA: Insufficient documentation

## 2019-05-16 DIAGNOSIS — H25012 Cortical age-related cataract, left eye: Secondary | ICD-10-CM | POA: Insufficient documentation

## 2019-05-31 DIAGNOSIS — H2513 Age-related nuclear cataract, bilateral: Secondary | ICD-10-CM | POA: Insufficient documentation

## 2019-05-31 DIAGNOSIS — H527 Unspecified disorder of refraction: Secondary | ICD-10-CM | POA: Insufficient documentation

## 2019-05-31 DIAGNOSIS — H04129 Dry eye syndrome of unspecified lacrimal gland: Secondary | ICD-10-CM | POA: Insufficient documentation

## 2019-06-06 DIAGNOSIS — Z961 Presence of intraocular lens: Secondary | ICD-10-CM | POA: Insufficient documentation

## 2020-04-09 ENCOUNTER — Other Ambulatory Visit: Payer: Self-pay

## 2020-04-09 ENCOUNTER — Encounter: Payer: Self-pay | Admitting: Physical Therapy

## 2020-04-09 ENCOUNTER — Ambulatory Visit: Payer: No Typology Code available for payment source | Attending: Emergency Medicine | Admitting: Physical Therapy

## 2020-04-09 DIAGNOSIS — R269 Unspecified abnormalities of gait and mobility: Secondary | ICD-10-CM

## 2020-04-09 DIAGNOSIS — M25662 Stiffness of left knee, not elsewhere classified: Secondary | ICD-10-CM | POA: Diagnosis present

## 2020-04-09 DIAGNOSIS — M6281 Muscle weakness (generalized): Secondary | ICD-10-CM | POA: Diagnosis present

## 2020-04-09 DIAGNOSIS — Z96652 Presence of left artificial knee joint: Secondary | ICD-10-CM

## 2020-04-11 NOTE — Therapy (Signed)
[facility] [address] [place], [place], [address] Phone: [phone]   Fax:  [phone]  Physical Therapy Evaluation  Patient Details  Name: Nicholas Reynolds MRN: [id number] Date of Birth: 1951-10-15 Referring Provider (PT): Rockwell Germany, PA-C   Encounter Date: 04/09/2020  PT End of Session - 04/11/20 2014    Visit Number  1    Number of Visits  9    Date for PT Re-Evaluation  05/07/20    Authorization - Visit Number  1    Authorization - Number of Visits  10    PT Start Time  [id number]    PT Stop Time  1446    PT Time Calculation (min)  51 min    Activity Tolerance  Patient tolerated treatment well;Patient limited by pain    Behavior During Therapy  [facility] for tasks assessed/performed       Past Medical History:  Diagnosis Date  . Arthritis    thumbs  . Asymptomatic varicose veins of bilateral lower extremities    Uncomfortable but in no ulcers  . Cancer ([place])   . Complication of anesthesia    slow to wake after hernia surg.  was taking "a bunch" of herbals at that time  . Coronary artery disease, non-occlusive July 2008   Nonobstructive by cath   . Family history of premature coronary artery disease    Father  . GERD (gastroesophageal reflux disease)   . 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 Obstructive sleep apnea    severe per study 09/05/14   . Paroxysmal atrial fibrillation ([place]) 2002   a) Initially diagnosed as Lone A Fib while in [place] (on ASA & BB after) - was on warfarin for a couple years and a Flecainide Pill-in Pocket PRN; b) recurrent A. fib with RVR August 2050 - status post cardioversion with  flecainide, on low-dose beta blocker  . Pre-syncope May 2015   Unrevealing monitor in May  . Uses hearing aid    bilateral  . Wears dentures    partial upper    Past Surgical History:  Procedure Laterality Date  . CARDIAC CATHETERIZATION  2008   Nonobstructive CAD  . CHOLECYSTECTOMY    . ELBOW SURGERY    . Event monitor  May 2015   Unrevealing  . HERNIA REPAIR    . KNEE ARTHROSCOPY Left 06/28/2015   Procedure: ARTHROSCOPY KNEE WITH SYNOVECTOMY;  Surgeon: Leanor Kail, MD;  Location: [place];  Service: Orthopedics;  Laterality: Left;  CPAP  . [place] RESECTION     December 20 14th  . NM MYOVIEW LTD  June 28, 2014   [facility]: [facility] -- no evidence of ischemia or infarction. Normal EF ~58%  . PORTA CATH INSERTION N/A 03/15/2017   Procedure: Glori Luis Cath Insertion;  Surgeon: Algernon Huxley, MD;  Location: [place] [facility];  Service: Cardiovascular;  Laterality: N/A;  . RADIOFREQUENCY ABLATION of Ventricular Tachycardia  July 2000 the   RVOT VT ablation: [facility], [place]    . TRANSTHORACIC ECHOCARDIOGRAM  May 2015   [facility] - Dr. Sharyn Lull: EF 55%, mild LVH; otherwise mostly normal  There were no vitals filed for this visit.   Subjective Assessment - 04/11/20 2006    Subjective  Pt. s/p L TKA on 03/21/2020 after chronic h/o L knee pain/ OA.  Pt. entered PT with use of SPC and states he had HHPT for past couple of weeks.  Pt. is sleeping in a recliner at this time and reports compliance with HEP/ icing.  Pt. reports 1/10 low back pain at this time.  Pt. reports no pain in L knee, "just stiffness".    Patient is accompained by:  Family member    Pertinent History  Pt. known well to PT clinic.  See PMHx.    Limitations  Standing;Walking;House hold activities    Patient Stated Goals  Increase L knee AROM/ strength to improve pain-free walking    Currently in Pain?  Yes    Pain Score  1     Pain  Location  Back    Pain Orientation  Lower    Pain Descriptors / Indicators  Aching    Pain Type  Chronic pain         [facility] PT Assessment - 04/11/20 0001      Assessment   Medical Diagnosis  S/p L TKA    Referring Provider (PT)  Traci Little, PA-C    Onset Date/Surgical Date  03/21/20    Prior Therapy  Yes, HHPT and pt. known to PT clinic.       Precautions   Precautions  None      Restrictions   Weight Bearing Restrictions  No      Balance Screen   Has the patient fallen in the past 6 months  No      [place] residence      Prior Function   Level of Independence  Independent      Cognition   Overall Cognitive Status  Within Functional Limits for tasks assessed       See flowsheet  Swelling in L/R knee:  Joint line (52/49 cm.), 2" superior (59.5/ 54 cm.), Mid-gastroc (47/ 42.5 cm.)  STS with min. UE assist  Pt. Has pool at home (will use once incision healed).     Objective measurements completed on examination: See above findings.       See handouts.   PT Education - 04/11/20 2013    Education Details  Reviewed current HEP.  Discussed importance of ice.    Person(s) Educated  Patient;Spouse    Methods  Explanation;Demonstration    Comprehension  Verbalized understanding;Returned demonstration          PT Long Term Goals - 04/11/20 2027      PT LONG TERM GOAL #1   Title  Pt. independent with HEP to increase L knee AROM (0-120 deg.) to improve functional mobility.    Baseline  R knee AROM: -2 to 125 deg.   L knee AROM: -3 to 114 deg.    Time  4    Period  Weeks    Status  New    Target Date  05/07/20      PT LONG TERM GOAL #2   Title  Pt. will increase FOTO to 61 to improve pain-free mobility.    Baseline  Initial FOTO: 47    Time  4    Period  Weeks    Status  New    Target Date  05/07/20      PT LONG TERM GOAL #3  Title  Pt. able to ambulate 10 minutes with normalized gait pattern and no c/o L  knee pain to improve overall functional mobility.    Baseline  L antalgic gait wtih ues of SPC on R.    Time  4    Period  Weeks    Status  New    Target Date  05/07/20      PT LONG TERM GOAL #4   Title  Pt. able to ascend/descend 10 stairs with recip. pattern and no c/o L knee pain or limitations.    Baseline  Step to gait with stairs.    Time  4    Period  Weeks    Status  New    Target Date  05/07/20             Plan - 04/11/20 2015    Clinical Impression Statement  Pt. is a pleasant 69 y/o male s/p L TKA on 03/21/20.  Pt. reports no L knee pain currently at rest and 1/10 low back pain.  Pt. c/o joint stiffness in L knee with flexion/ walking.  Pt. has steristrips in place at this time with good incision healing noted.  Supine R knee AROM: -2 to 125 deg. (no pain).  L knee AROM: -3 to 114 deg. (pain limited).  FOTO: initial 47/ goal 61.  Pt. ambulates with moderate L antalgic gait pattern with SPC in PT clinic.  Limited L hip/knee flexion during swing through phase of gait.  Pt. ascends/descends stairs with step to gait pattern with handrail use.  Pt. will benefit from skilled PT services to increse L knee ROM/ strength to improve functional mobility/ pain-free walking.    Stability/Clinical Decision Making  Evolving/Moderate complexity    Clinical Decision Making  Moderate    Rehab Potential  Good    PT Frequency  2x / week    PT Duration  4 weeks    PT Treatment/Interventions  ADLs/Self Care Home Management;Aquatic Therapy;Cryotherapy;Electrical Stimulation;Therapeutic activities;Therapeutic exercise;Balance training;Neuromuscular re-education;Functional mobility training;Stair training;Gait training;DME Instruction;Patient/family education;Manual techniques;Scar mobilization;Passive range of motion    PT Next Visit Plan  Increase L knee AROM (extension/ flexion).  Progress to independent gait without assistive device.       Patient will benefit from skilled therapeutic  intervention in order to improve the following deficits and impairments:  Abnormal gait, Decreased balance, Decreased endurance, Decreased mobility, Difficulty walking, Hypomobility, Obesity, Improper body mechanics, Decreased scar mobility, Decreased range of motion, Decreased activity tolerance, Decreased safety awareness, Decreased strength, Postural dysfunction, Pain  Visit Diagnosis: Status post total left knee replacement  Joint stiffness of knee, left  Muscle weakness (generalized)  Gait difficulty     Problem List Patient Active Problem List   Diagnosis Date Noted  . Hypotension 06/16/2017  . Rectal prolapse 06/16/2017  . Abdominal pain 06/16/2017  . Arthritis of knee 04/15/2017  . Goals of care, counseling/discussion 03/15/2017  . 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[place]) 03/15/2017  . Chronic back pain 03/09/2017  . Gastroesophageal reflux 03/09/2017  . Morbid obesity ([place]) 03/09/2017  . Peripheral vascular disease ([place]) 03/09/2017  . 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 Ventricular tachycardia ([place]) 03/09/2017  . 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 Atrial tachycardia ([place]) 09/09/2016  . Near syncope 09/09/2016  . Osteoarthritis of knee 08/11/2016  . Sleep apnea   . Family history of premature coronary artery disease   . Pigmented villonodular synovitis of left knee 07/09/2015  . Strain of left quadriceps  tendon 04/18/2015  . Traumatic arthritis of knee, left 04/18/2015  . Pain in right knee 03/07/2015  . Obesity (BMI 30-39.9) 12/05/2014  . Orthostatic dizziness 12/05/2014  . Trigger finger of left thumb 09/06/2014  . Atrial fibrillation ([place]) 07/15/2014  . Varicose veins of lower extremity with edema 07/15/2014  . Fatigue 07/15/2014  . Hearing loss, sensorineural 04/26/2014  . Palpitations 04/06/2014  . 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 Benign non-nodular prostatic hyperplasia with lower urinary tract symptoms 03/10/2013   . Venous insufficiency 11/17/2012  . 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 H/O ventricular tachycardia 05/24/2007  . Coronary artery disease, non-occlusive 05/24/2007   Pura Spice, PT, DPT # [phone] 04/11/2020, 8:33 PM  [facility] [address] [place], [place], [address] Phone: [phone]   Fax:  [phone]  Name: Nicholas Reynolds MRN: [id number] Date of Birth: Aug 13, 1951

## 2020-04-16 ENCOUNTER — Ambulatory Visit: Payer: 59 | Admitting: Physical Therapy

## 2020-04-16 ENCOUNTER — Ambulatory Visit: Payer: No Typology Code available for payment source | Admitting: Physical Therapy

## 2020-04-16 ENCOUNTER — Other Ambulatory Visit: Payer: Self-pay

## 2020-04-16 DIAGNOSIS — R269 Unspecified abnormalities of gait and mobility: Secondary | ICD-10-CM

## 2020-04-16 DIAGNOSIS — Z96652 Presence of left artificial knee joint: Secondary | ICD-10-CM

## 2020-04-16 DIAGNOSIS — M25662 Stiffness of left knee, not elsewhere classified: Secondary | ICD-10-CM

## 2020-04-16 DIAGNOSIS — M6281 Muscle weakness (generalized): Secondary | ICD-10-CM

## 2020-04-18 ENCOUNTER — Other Ambulatory Visit: Payer: Self-pay

## 2020-04-18 ENCOUNTER — Ambulatory Visit: Payer: No Typology Code available for payment source | Admitting: Physical Therapy

## 2020-04-18 DIAGNOSIS — M25662 Stiffness of left knee, not elsewhere classified: Secondary | ICD-10-CM

## 2020-04-18 DIAGNOSIS — Z96652 Presence of left artificial knee joint: Secondary | ICD-10-CM | POA: Diagnosis not present

## 2020-04-18 DIAGNOSIS — R269 Unspecified abnormalities of gait and mobility: Secondary | ICD-10-CM

## 2020-04-18 DIAGNOSIS — M6281 Muscle weakness (generalized): Secondary | ICD-10-CM

## 2020-04-19 NOTE — Therapy (Signed)
[facility] [address] [place], [place], [address] Phone: [phone]   Fax:  [phone]  Physical Therapy Treatment  Patient Details  Name: Nicholas Reynolds MRN: [id number] Date of Birth: Jun 14, 1951 Referring Provider (PT): Rockwell Germany, PA-C   Encounter Date: 04/16/2020  PT End of Session - 04/19/20 1415    Visit Number  2    Number of Visits  9    Date for PT Re-Evaluation  05/07/20    Authorization - Visit Number  2    Authorization - Number of Visits  10    PT Start Time  [id number]    PT Stop Time  [id number]    PT Time Calculation (min)  49 min    Activity Tolerance  Patient tolerated treatment well;Patient limited by pain    Behavior During Therapy  [facility] for tasks assessed/performed       Past Medical History:  Diagnosis Date  . Arthritis    thumbs  . Asymptomatic varicose veins of bilateral lower extremities    Uncomfortable but in no ulcers  . Cancer ([place])   . Complication of anesthesia    slow to wake after hernia surg.  was taking "a bunch" of herbals at that time  . Coronary artery disease, non-occlusive July 2008   Nonobstructive by cath   . Family history of premature coronary artery disease    Father  . GERD (gastroesophageal reflux disease)   . 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 Obstructive sleep apnea    severe per study 09/05/14   . Paroxysmal atrial fibrillation ([place]) 2002   a) Initially diagnosed as Lone A Fib while in [place] (on ASA & BB after) - was on warfarin for a couple years and a Flecainide Pill-in Pocket PRN; b) recurrent A. fib with RVR August 2050 - status post cardioversion with  flecainide, on low-dose beta blocker  . Pre-syncope May 2015   Unrevealing monitor in May  . Uses hearing aid    bilateral  . Wears dentures    partial upper    Past Surgical History:  Procedure Laterality Date  . CARDIAC CATHETERIZATION  2008   Nonobstructive CAD  . CHOLECYSTECTOMY    . ELBOW SURGERY    . Event monitor  May 2015   Unrevealing  . HERNIA REPAIR    . KNEE ARTHROSCOPY Left 06/28/2015   Procedure: ARTHROSCOPY KNEE WITH SYNOVECTOMY;  Surgeon: Leanor Kail, MD;  Location: [place];  Service: Orthopedics;  Laterality: Left;  CPAP  . [place] RESECTION     December 20 14th  . NM MYOVIEW LTD  June 28, 2014   [facility]: [facility] -- no evidence of ischemia or infarction. Normal EF ~58%  . PORTA CATH INSERTION N/A 03/15/2017   Procedure: Glori Luis Cath Insertion;  Surgeon: Algernon Huxley, MD;  Location: [place] [facility];  Service: Cardiovascular;  Laterality: N/A;  . RADIOFREQUENCY ABLATION of Ventricular Tachycardia  July 2000 the   RVOT VT ablation: [facility], [place]    . TRANSTHORACIC ECHOCARDIOGRAM  May 2015   [facility] - Dr. Sharyn Lull: EF 55%, mild LVH; otherwise mostly normal  There were no vitals filed for this visit.  Subjective Assessment - 04/19/20 1413    Subjective  Pt. reports 1/10 L knee pain and presents with significant L lower leg swelling.  Pt. has good pulse in calf/foot and no redness.    Patient is accompained by:  Family member    Pertinent History  Pt. known well to PT clinic.  See PMHx.    Limitations  Standing;Walking;House hold activities    Patient Stated Goals  Increase L knee AROM/ strength to improve pain-free walking    Currently in Pain?  Yes    Pain Score  1     Pain Location  Knee    Pain Orientation  Left    Pain Descriptors / Indicators  Aching       There.ex.:  Standing marching (forward/ backwards/ lateral)- 3x in //-bars with cuing for  posture Step ups at stairs (discomfort in knee with step ups) Reviewed HEP Nustep L5 10 min.    Manual tx.:  Supine patellar mobs. (medial/ lateral/ superior/ inferior)- tenderness STM to L distal quad/ knee Supine L knee AAROM (flexion/ extension)- 117 deg. Flexion Supine hamstring stretches/ knee to chest (as tolerated)    PT Long Term Goals - 04/11/20 2027      PT LONG TERM GOAL #1   Title  Pt. independent with HEP to increase L knee AROM (0-120 deg.) to improve functional mobility.    Baseline  R knee AROM: -2 to 125 deg.   L knee AROM: -3 to 114 deg.    Time  4    Period  Weeks    Status  New    Target Date  05/07/20      PT LONG TERM GOAL #2   Title  Pt. will increase FOTO to 61 to improve pain-free mobility.    Baseline  Initial FOTO: 47    Time  4    Period  Weeks    Status  New    Target Date  05/07/20      PT LONG TERM GOAL #3   Title  Pt. able to ambulate 10 minutes with normalized gait pattern and no c/o L knee pain to improve overall functional mobility.    Baseline  L antalgic gait wtih ues of SPC on R.    Time  4    Period  Weeks    Status  New    Target Date  05/07/20      PT LONG TERM GOAL #4   Title  Pt. able to ascend/descend 10 stairs with recip. pattern and no c/o L knee pain or limitations.    Baseline  Step to gait with stairs.    Time  4    Period  Weeks    Status  New    Target Date  05/07/20            Plan - 04/19/20 1423    Clinical Impression Statement  Pt. presents with excellent L knee flexion (117 deg.).  Pt. ambulates with consistent recip. pattern with slight L antalgic gait pattern and benefits from [facility].  Pts. incision is healing well with moderate scabbing noted.  Slight warmth at knee and significant L lower leg swelling.  BP: 128/82.  Pt. works hard during tx. session and fatigue noted. Tenderness with medial/ lateral patellar mobs.    Stability/Clinical Decision Making  Evolving/Moderate complexity    Clinical Decision  Making  Moderate    Rehab Potential  Good  PT Frequency  2x / week    PT Duration  4 weeks    PT Treatment/Interventions  ADLs/Self Care Home Management;Aquatic Therapy;Cryotherapy;Electrical Stimulation;Therapeutic activities;Therapeutic exercise;Balance training;Neuromuscular re-education;Functional mobility training;Stair training;Gait training;DME Instruction;Patient/family education;Manual techniques;Scar mobilization;Passive range of motion    PT Next Visit Plan  Increase L knee AROM (extension/ flexion).  Progress to independent gait without assistive device.       Patient will benefit from skilled therapeutic intervention in order to improve the following deficits and impairments:  Abnormal gait, Decreased balance, Decreased endurance, Decreased mobility, Difficulty walking, Hypomobility, Obesity, Improper body mechanics, Decreased scar mobility, Decreased range of motion, Decreased activity tolerance, Decreased safety awareness, Decreased strength, Postural dysfunction, Pain  Visit Diagnosis: Status post total left knee replacement  Joint stiffness of knee, left  Muscle weakness (generalized)  Gait difficulty     Problem List Patient Active Problem List   Diagnosis Date Noted  . Hypotension 06/16/2017  . Rectal prolapse 06/16/2017  . Abdominal pain 06/16/2017  . Arthritis of knee 04/15/2017  . Goals of care, counseling/discussion 03/15/2017  . 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[place]) 03/15/2017  . Chronic back pain 03/09/2017  . Gastroesophageal reflux 03/09/2017  . Morbid obesity ([place]) 03/09/2017  . Peripheral vascular disease ([place]) 03/09/2017  . 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 Ventricular tachycardia ([place]) 03/09/2017  . 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 Atrial tachycardia ([place]) 09/09/2016  . Near syncope 09/09/2016  . Osteoarthritis of knee 08/11/2016  . Sleep apnea   . Family history of premature coronary artery disease   .  Pigmented villonodular synovitis of left knee 07/09/2015  . Strain of left quadriceps tendon 04/18/2015  . Traumatic arthritis of knee, left 04/18/2015  . Pain in right knee 03/07/2015  . Obesity (BMI 30-39.9) 12/05/2014  . Orthostatic dizziness 12/05/2014  . Trigger finger of left thumb 09/06/2014  . Atrial fibrillation ([place]) 07/15/2014  . Varicose veins of lower extremity with edema 07/15/2014  . Fatigue 07/15/2014  . Hearing loss, sensorineural 04/26/2014  . Palpitations 04/06/2014  . 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 Benign non-nodular prostatic hyperplasia with lower urinary tract symptoms 03/10/2013  . Venous insufficiency 11/17/2012  . 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 H/O ventricular tachycardia 05/24/2007  . Coronary artery disease, non-occlusive 05/24/2007   Pura Spice, PT, DPT # [phone] 04/19/2020, 2:36 PM  [facility] [address] [place], [place], [address] Phone: [phone]   Fax:  [phone]  Name: Nicholas Reynolds MRN: [id number] Date of Birth: 27-Feb-1951

## 2020-04-20 NOTE — Therapy (Signed)
[facility] [address]. [place], [place], [address] Phone: [phone]   Fax:  [phone]  Physical Therapy Treatment  Patient Details  Name: Nicholas Reynolds MRN: [id number] Date of Birth: 1951-06-28 Referring Provider (PT): Rockwell Germany, PA-C   Encounter Date: 04/18/2020  PT End of Session - 04/20/20 1327    Visit Number  3    Number of Visits  9    Date for PT Re-Evaluation  05/07/20    Authorization - Visit Number  3    Authorization - Number of Visits  10    PT Start Time  1600    PT Stop Time  1650    PT Time Calculation (min)  50 min    Activity Tolerance  Patient tolerated treatment well;Patient limited by pain    Behavior During Therapy  [facility] for tasks assessed/performed       Past Medical History:  Diagnosis Date  . Arthritis    thumbs  . Asymptomatic varicose veins of bilateral lower extremities    Uncomfortable but in no ulcers  . Cancer ([place])   . Complication of anesthesia    slow to wake after hernia surg.  was taking "a bunch" of herbals at that time  . Coronary artery disease, non-occlusive July 2008   Nonobstructive by cath   . Family history of premature coronary artery disease    Father  . GERD (gastroesophageal reflux disease)   . 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 Obstructive sleep apnea    severe per study 09/05/14   . Paroxysmal atrial fibrillation ([place]) 2002   a) Initially diagnosed as Lone A Fib while in [place] (on ASA & BB after) - was on warfarin for a couple years and a Flecainide Pill-in Pocket PRN; b) recurrent A. fib with RVR August 2050 - status post cardioversion with  flecainide, on low-dose beta blocker  . Pre-syncope May 2015   Unrevealing monitor in May  . Uses hearing aid    bilateral  . Wears dentures    partial upper    Past Surgical History:  Procedure Laterality Date  . CARDIAC CATHETERIZATION  2008   Nonobstructive CAD  . CHOLECYSTECTOMY    . ELBOW SURGERY    . Event monitor  May 2015   Unrevealing  . HERNIA REPAIR    . KNEE ARTHROSCOPY Left 06/28/2015   Procedure: ARTHROSCOPY KNEE WITH SYNOVECTOMY;  Surgeon: Leanor Kail, MD;  Location: [facility];  Service: Orthopedics;  Laterality: Left;  CPAP  . [place] RESECTION     December 20 14th  . NM MYOVIEW LTD  June 28, 2014   [facility]: [facility] -- no evidence of ischemia or infarction. Normal EF ~58%  . PORTA CATH INSERTION N/A 03/15/2017   Procedure: Glori Luis Cath Insertion;  Surgeon: Algernon Huxley, MD;  Location: [facility];  Service: Cardiovascular;  Laterality: N/A;  . RADIOFREQUENCY ABLATION of Ventricular Tachycardia  July 2000 the   RVOT VT ablation: [facility], [place]    . TRANSTHORACIC ECHOCARDIOGRAM  May 2015   [facility] - Dr. Sharyn Lull: EF 55%, mild LVH; otherwise mostly normal  There were no vitals filed for this visit.  Subjective Assessment - 04/20/20 1321    Subjective  Pt. c/o 1/10 L knee pain while walking into PT clinic.  Pt. presents with decrease L lower leg swelling as compared to last tx. session.  Pt. is traveling to [place] next week for work and will continue with HEP.    Patient is accompained by:  Family member    Pertinent History  Pt. known well to PT clinic.  See PMHx.    Limitations  Standing;Walking;House hold activities    Patient Stated Goals  Increase L knee AROM/ strength to improve pain-free walking    Currently in Pain?  Yes    Pain Score  1     Pain Location  Knee    Pain Orientation  Left    Pain Descriptors / Indicators  Aching        There.ex.:  Nustep L5 10 min. (seat position #14)- 0.61 miles    (HR: 135 bpm) //-bars walking: forward/ backwards/ lateral 2x each (no UE) Walking partial lunges in //-bars 10x. Tandem stance/ gait (improvement) SLS >10 sec. (limited on L) Step ups (recip. Pattern): 3x Attempted 12" step ups (pain limited)- 1x stopped SAQ 20x (good incision healing)  Manual tx.:  Supine L knee A/AROM (flexion/ extension)- 5x each (excellent ROM)- 121 deg. Flexion STM to L distal quad 4 min. Patellar mobs. (all planes)     PT Long Term Goals - 04/11/20 2027      PT LONG TERM GOAL #1   Title  Pt. independent with HEP to increase L knee AROM (0-120 deg.) to improve functional mobility.    Baseline  R knee AROM: -2 to 125 deg.   L knee AROM: -3 to 114 deg.    Time  4    Period  Weeks    Status  New    Target Date  05/07/20      PT LONG TERM GOAL #2   Title  Pt. will increase FOTO to 61 to improve pain-free mobility.    Baseline  Initial FOTO: 47    Time  4    Period  Weeks    Status  New    Target Date  05/07/20      PT LONG TERM GOAL #3   Title  Pt. able to ambulate 10 minutes with normalized gait pattern and no c/o L knee pain to improve overall functional mobility.    Baseline  L antalgic gait wtih ues of SPC on R.    Time  4    Period  Weeks    Status  New    Target Date  05/07/20      PT LONG TERM GOAL #4   Title  Pt. able to ascend/descend 10 stairs with recip. pattern and no c/o L knee pain or limitations.    Baseline  Step to gait with stairs.    Time  4    Period  Weeks    Status  New    Target Date  05/07/20            Plan - 04/20/20 1327    Clinical Impression Statement  Pt. continues to impress with progression of gait pattern/ L knee ROM ex.  L knee flexion 121 deg.  SLS for 10 sec. on L.  Increase in tandem stance/ gait without UE assist.  Pt. limited with L eccentric knee/quad control during descending stairs and unable to step up on  12" step due to  pain.  Pt. instructed to take breaks while traveling to [place] and wear compression stockings.    Stability/Clinical Decision Making  Evolving/Moderate complexity    Clinical Decision Making  Moderate    Rehab Potential  Good    PT Frequency  2x / week    PT Duration  4 weeks    PT Treatment/Interventions  ADLs/Self Care Home Management;Aquatic Therapy;Cryotherapy;Electrical Stimulation;Therapeutic activities;Therapeutic exercise;Balance training;Neuromuscular re-education;Functional mobility training;Stair training;Gait training;DME Instruction;Patient/family education;Manual techniques;Scar mobilization;Passive range of motion    PT Next Visit Plan  Increase L knee AROM (extension/ flexion).  Progress to independent gait without assistive device.       Patient will benefit from skilled therapeutic intervention in order to improve the following deficits and impairments:  Abnormal gait, Decreased balance, Decreased endurance, Decreased mobility, Difficulty walking, Hypomobility, Obesity, Improper body mechanics, Decreased scar mobility, Decreased range of motion, Decreased activity tolerance, Decreased safety awareness, Decreased strength, Postural dysfunction, Pain  Visit Diagnosis: Status post total left knee replacement  Joint stiffness of knee, left  Muscle weakness (generalized)  Gait difficulty     Problem List Patient Active Problem List   Diagnosis Date Noted  . Hypotension 06/16/2017  . Rectal prolapse 06/16/2017  . Abdominal pain 06/16/2017  . Arthritis of knee 04/15/2017  . Goals of care, counseling/discussion 03/15/2017  . 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[place]) 03/15/2017  . Chronic back pain 03/09/2017  . Gastroesophageal reflux 03/09/2017  . Morbid obesity ([place]) 03/09/2017  . Peripheral vascular disease ([place]) 03/09/2017  . 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 Ventricular tachycardia ([place]) 03/09/2017  . 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   . Atrial tachycardia ([place]) 09/09/2016  . Near syncope 09/09/2016  . Osteoarthritis of knee 08/11/2016  . Sleep apnea   . Family history of premature coronary artery disease   . Pigmented villonodular synovitis of left knee 07/09/2015  . Strain of left quadriceps tendon 04/18/2015  . Traumatic arthritis of knee, left 04/18/2015  . Pain in right knee 03/07/2015  . Obesity (BMI 30-39.9) 12/05/2014  . Orthostatic dizziness 12/05/2014  . Trigger finger of left thumb 09/06/2014  . Atrial fibrillation ([place]) 07/15/2014  . Varicose veins of lower extremity with edema 07/15/2014  . Fatigue 07/15/2014  . Hearing loss, sensorineural 04/26/2014  . Palpitations 04/06/2014  . 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 Benign non-nodular prostatic hyperplasia with lower urinary tract symptoms 03/10/2013  . Venous insufficiency 11/17/2012  . 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 H/O ventricular tachycardia 05/24/2007  . Coronary artery disease, non-occlusive 05/24/2007   Pura Spice, PT, DPT # [phone] 04/20/2020, 1:34 PM  [facility] [address] [place], [place], [address] Phone: [phone]   Fax:  [phone]  Name: YAVIN LUKOWSKI MRN: [id number] Date of Birth: 1951-03-10

## 2020-04-23 ENCOUNTER — Ambulatory Visit: Payer: No Typology Code available for payment source | Admitting: Physical Therapy

## 2020-04-25 ENCOUNTER — Encounter: Payer: Self-pay | Admitting: Physical Therapy

## 2020-04-30 ENCOUNTER — Encounter: Payer: Self-pay | Admitting: Physical Therapy

## 2020-04-30 ENCOUNTER — Ambulatory Visit: Payer: No Typology Code available for payment source | Attending: Emergency Medicine | Admitting: Physical Therapy

## 2020-04-30 ENCOUNTER — Other Ambulatory Visit: Payer: Self-pay

## 2020-04-30 DIAGNOSIS — M6281 Muscle weakness (generalized): Secondary | ICD-10-CM

## 2020-04-30 DIAGNOSIS — R269 Unspecified abnormalities of gait and mobility: Secondary | ICD-10-CM | POA: Diagnosis present

## 2020-04-30 DIAGNOSIS — M25662 Stiffness of left knee, not elsewhere classified: Secondary | ICD-10-CM | POA: Diagnosis present

## 2020-04-30 DIAGNOSIS — Z96652 Presence of left artificial knee joint: Secondary | ICD-10-CM | POA: Insufficient documentation

## 2020-04-30 NOTE — Therapy (Signed)
[facility] [address] [place], [place], [address] Phone: [phone]   Fax:  [phone]  Physical Therapy Treatment  Patient Details  Name: Nicholas Reynolds MRN: [id number] Date of Birth: 08-Jun-1951 Referring Provider (PT): Rockwell Germany, PA-C   Encounter Date: 04/30/2020  PT End of Session - 04/30/20 1701    Visit Number  4    Number of Visits  9    Date for PT Re-Evaluation  05/07/20    Authorization - Visit Number  4    Authorization - Number of Visits  10    PT Start Time  7408    PT Stop Time  1448    PT Time Calculation (min)  36 min    Activity Tolerance  Patient tolerated treatment well;Patient limited by pain    Behavior During Therapy  [facility] for tasks assessed/performed       Past Medical History:  Diagnosis Date   Arthritis    thumbs   Asymptomatic varicose veins of bilateral lower extremities    Uncomfortable but in no ulcers   Cancer ([place])    Complication of anesthesia    slow to wake after hernia surg.  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 fib with RVR August 2050 - status post cardioversion with  flecainide, on low-dose beta blocker   Pre-syncope May 2015   Unrevealing monitor in May   Uses hearing aid    bilateral   Wears dentures    partial upper    Past Surgical History:  Procedure Laterality Date   CARDIAC CATHETERIZATION  2008   Nonobstructive CAD   CHOLECYSTECTOMY     ELBOW SURGERY     Event monitor  May 2015   Unrevealing   HERNIA REPAIR     KNEE ARTHROSCOPY Left 06/28/2015   Procedure: ARTHROSCOPY KNEE WITH SYNOVECTOMY;  Surgeon: Leanor Kail, MD;  Location: [facility];  Service: Orthopedics;  Laterality: Left;  CPAP   LAPAROSCOPIC GASTRIC SLEEVE RESECTION     December 20 14th   NM [place]  June 28, 2014   [facility]: [facility] -- no evidence of ischemia or infarction. Normal EF ~58%   PORTA CATH INSERTION N/A 03/15/2017   Procedure: Glori Luis Cath Insertion;  Surgeon: Algernon Huxley, MD;  Location: [facility];  Service: Cardiovascular;  Laterality: N/A;   RADIOFREQUENCY ABLATION of Ventricular Tachycardia  July 2000 the   RVOT VT ablation: [facility] [place] ECHOCARDIOGRAM  May 2015   [facility] - Dr. Sharyn Lull: EF 55%, mild LVH; otherwise mostly normal  There were no vitals filed for this visit.  Subjective Assessment - 04/30/20 1700    Subjective  Pt reports 0/10 L knee pain. Pt states he had no pain or issues with L knee during trip to [place].    Patient is accompained by:  Family member    Pertinent History  Pt. known well to PT clinic.  See PMHx.    Limitations  Standing;Walking;House hold activities    Patient Stated Goals  Increase L knee AROM/ strength to improve pain-free walking    Currently in Pain?  No/denies    Pain Score  0-No pain        There.ex:    Nu-Step: 10 min, L5  (LE only)  12" Step-ups with 1 railing UE support: 2x10   STS: 2x10 (good symmetry in LE)  Partial LE lunge in // bars: 2x10   4-way resisted ambulation on Nautilus: 1x5  each direction, 80 lbs   Manual Therapy:    Sup/inf/lat/medial patellar mobs: Grade 3, 3x30 seconds each direction  SL hamstring stretch/piriformis stretch: 1x30 sec each LLE  L passive gastroc stretch: 1x30   Supine L knee flexion 122 deg.     PT Long Term Goals - 04/11/20 2027      PT LONG TERM GOAL #1   Title  Pt. independent with HEP to increase L knee AROM (0-120 deg.) to improve functional mobility.    Baseline  R knee AROM: -2 to 125 deg.   L knee AROM: -3 to 114 deg.    Time  4    Period  Weeks    Status  New    Target Date  05/07/20      PT LONG TERM GOAL #2   Title  Pt. will increase FOTO to 61 to improve pain-free mobility.    Baseline  Initial FOTO: 47    Time  4    Period  Weeks    Status  New    Target Date  05/07/20      PT LONG TERM GOAL #3   Title  Pt. able to ambulate 10 minutes with normalized gait pattern and no c/o L knee pain to improve overall functional mobility.    Baseline  L antalgic gait wtih ues of SPC on R.    Time  4    Period  Weeks    Status  New    Target Date  05/07/20      PT LONG TERM GOAL #4   Title  Pt. able to ascend/descend 10 stairs with recip. pattern and no c/o L knee pain or limitations.    Baseline  Step to gait with stairs.    Time  4    Period  Weeks    Status  New    Target Date  05/07/20            Plan - 04/30/20 1701    Clinical Impression Statement  Pt ambulating in normal, reciprical gait pattern. No antalgic gait noticeable on L side compared to previous sessions. Pt was also able to perform 12" step-up with no c/o L knee pain compared to previous session. Pt displays minor, L eccentric quad weakness with STS's. Pt displayed increased fatigue after 4 way resisted ambulation with increased dificulty side-stepping to the L side. Pt's L knee flexion continues to be [facility] at 122 degrees. Pt can continue to benefit from further skilled PT treatment to improve L knee eccentric strength and hip strength to improve  ADL's such as asc/desc stairs.    Stability/Clinical Decision Making  Evolving/Moderate complexity    Clinical Decision Making  Moderate    Rehab Potential  Good    PT Frequency  2x / week    PT Duration  4 weeks    PT Treatment/Interventions  ADLs/Self Care Home Management;Aquatic Therapy;Cryotherapy;Electrical Stimulation;Therapeutic activities;Therapeutic exercise;Balance training;Neuromuscular re-education;Functional mobility training;Stair training;Gait training;DME Instruction;Patient/family education;Manual techniques;Scar mobilization;Passive range of motion    PT Next Visit Plan  Reassess goals.       Patient will benefit from skilled therapeutic intervention in order to improve the following deficits and impairments:  Abnormal gait, Decreased balance, Decreased endurance, Decreased mobility, Difficulty walking, Hypomobility, Obesity, Improper body mechanics, Decreased scar mobility, Decreased range of motion, Decreased activity tolerance, Decreased safety awareness, Decreased strength, Postural dysfunction, Pain  Visit Diagnosis: Status post total left knee replacement  Joint stiffness of knee, left  Muscle weakness (generalized)  Gait difficulty     Problem List Patient Active Problem List   Diagnosis Date Noted   Hypotension 06/16/2017   Rectal prolapse 06/16/2017   Abdominal pain 06/16/2017   Arthritis of knee 04/15/2017   Goals of care, counseling/discussion 03/15/2017   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[place]) 03/15/2017   Chronic back pain 03/09/2017   Gastroesophageal reflux 03/09/2017   Morbid obesity ([place]) 03/09/2017   Peripheral vascular disease ([place]) 03/09/2017   Vasomotor rhinitis 03/09/2017   Ventricular tachycardia ([place]) 03/09/2017   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   Atrial tachycardia ([place]) 09/09/2016   Near syncope 09/09/2016   Osteoarthritis of knee 08/11/2016   Sleep apnea    Family history of  premature coronary artery disease    Pigmented villonodular synovitis of left knee 07/09/2015   Strain of left quadriceps tendon 04/18/2015   Traumatic arthritis of knee, left 04/18/2015   Pain in right knee 03/07/2015   Obesity (BMI 30-39.9) 12/05/2014   Orthostatic dizziness 12/05/2014   Trigger finger of left thumb 09/06/2014   Atrial fibrillation ([place]) 07/15/2014   Varicose veins of lower extremity with edema 07/15/2014   Fatigue 07/15/2014   Hearing loss, sensorineural 04/26/2014   Palpitations 04/06/2014   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 [place], [place], [address] Phone: [phone]   Fax:  [phone]  Name: Nicholas Reynolds MRN: [id number] Date of Birth: 1951/08/07

## 2020-05-02 ENCOUNTER — Encounter: Payer: Self-pay | Admitting: Physical Therapy

## 2020-05-02 ENCOUNTER — Ambulatory Visit: Payer: No Typology Code available for payment source | Admitting: Physical Therapy

## 2020-05-02 ENCOUNTER — Other Ambulatory Visit: Payer: Self-pay

## 2020-05-02 DIAGNOSIS — R269 Unspecified abnormalities of gait and mobility: Secondary | ICD-10-CM

## 2020-05-02 DIAGNOSIS — Z96652 Presence of left artificial knee joint: Secondary | ICD-10-CM

## 2020-05-02 DIAGNOSIS — M25662 Stiffness of left knee, not elsewhere classified: Secondary | ICD-10-CM

## 2020-05-02 DIAGNOSIS — M6281 Muscle weakness (generalized): Secondary | ICD-10-CM

## 2020-05-02 NOTE — Therapy (Signed)
[facility] [address] [place], [place], [address] Phone: [phone]   Fax:  [phone]  Physical Therapy Treatment  Patient Details  Name: SEVON ROTERT MRN: [id number] Date of Birth: October 02, 1951 Referring Provider (PT): Rockwell Germany, PA-C   Encounter Date: 05/02/2020   PT End of Session - 05/02/20 1642    Visit Number 5    Number of Visits 9    Date for PT Re-Evaluation 05/07/20    Authorization - Visit Number 5    Authorization - Number of Visits 10    PT Start Time 7253    PT Stop Time 6644    PT Time Calculation (min) 50 min    Activity Tolerance Patient tolerated treatment well;Patient limited by pain    Behavior During Therapy [facility] for tasks assessed/performed           Past Medical History:  Diagnosis Date   Arthritis    thumbs   Asymptomatic varicose veins of bilateral lower extremities    Uncomfortable but in no ulcers   Cancer ([place])    Complication of anesthesia    slow to wake after hernia surg.  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 fib with RVR August 2050 - status post cardioversion with  flecainide, on low-dose beta blocker   Pre-syncope May 2015   Unrevealing monitor in May   Uses hearing aid    bilateral   Wears dentures    partial upper    Past Surgical History:  Procedure Laterality Date   CARDIAC CATHETERIZATION  2008   Nonobstructive CAD   CHOLECYSTECTOMY     ELBOW SURGERY     Event monitor  May 2015   Unrevealing   HERNIA REPAIR     KNEE ARTHROSCOPY Left 06/28/2015   Procedure: ARTHROSCOPY KNEE WITH SYNOVECTOMY;  Surgeon: Leanor Kail, MD;  Location: [facility];  Service: Orthopedics;  Laterality: Left;  CPAP   LAPAROSCOPIC GASTRIC SLEEVE RESECTION     December 20 14th   NM [place]  June 28, 2014   [facility]: [facility] -- no evidence of ischemia or infarction. Normal EF ~58%   PORTA CATH INSERTION N/A 03/15/2017   Procedure: Glori Luis Cath Insertion;  Surgeon: Algernon Huxley, MD;  Location: [place] [facility];  Service: Cardiovascular;  Laterality: N/A;   RADIOFREQUENCY ABLATION of Ventricular Tachycardia  July 2000 the   RVOT VT ablation: [facility] [place] ECHOCARDIOGRAM  May 2015   [facility] - Dr. Sharyn Lull: EF 55%, mild LVH; otherwise mostly normal    There were  no vitals filed for this visit.   Subjective Assessment - 05/02/20 1554    Subjective Pt. reports R knee feeling good (0/10), but some pain (7/10) in both knees when going up stairs.    Patient is accompained by: Family member    Pertinent History Pt. known well to PT clinic.  See PMHx.    Limitations Standing;Walking;House hold activities    Patient Stated Goals Increase L knee AROM/ strength to improve pain-free walking    Currently in Pain? No/denies   going up stairs   Pain Score --              Therex:  Nustep: Level 7, 10 minutes   TRX squats to chair- 5 reps. Progressed to single leg controlled descent on L knee and standing with double leg support, added 2 airex pads to chair.    Walking lunges in // bars- 3x back and forth: pt cued on proper form, keeping knees from going past toes during.   12 inch step up to 6 inch step up on stairs: cued to maintain hip and trunk control to decrease forward trunk lean compensation.   Calf stretch 3x20 sec  Manual:  STM to R and L quads: found trigger points in vastus lateralis and rectus femoris on R, trigger points in rectus femoris on L.           PT Long Term Goals - 04/11/20 2027      PT LONG TERM GOAL #1   Title Pt. independent with HEP to increase L knee AROM (0-120 deg.) to improve functional mobility.    Baseline R knee AROM: -2 to 125 deg.   L knee AROM: -3 to 114 deg.    Time 4    Period Weeks    Status New    Target Date 05/07/20      PT LONG TERM GOAL #2   Title Pt. will increase FOTO to 61 to improve pain-free mobility.    Baseline Initial FOTO: 47    Time 4    Period Weeks    Status New    Target Date 05/07/20      PT LONG TERM GOAL #3   Title Pt. able to ambulate 10 minutes with normalized gait pattern and no c/o L knee pain to improve overall functional mobility.    Baseline L antalgic gait wtih ues of SPC on R.    Time 4    Period Weeks    Status New    Target Date 05/07/20      PT LONG TERM GOAL #4   Title Pt. able to ascend/descend 10 stairs with recip. pattern and no c/o L knee pain or limitations.    Baseline Step to gait with stairs.    Time 4    Period Weeks    Status New    Target Date 05/07/20                 Plan - 05/02/20 1657    Clinical Impression Statement Pt. ambulated with reciprocal gait pattern, slightly less dorsiflexion on L foot compared to right. Pt. mentioned having 7/10 pain in both knees when ascending stairs. Pt. completed upper level glute strengthening today, with focus on proper form and muscular control. Pt. displayed decreased contorl with single leg activities with maintaining a level pelvis, can maintain it with verbal cueing. Pt. will  benefit from further therapy to improve postural control and to decrease pain when climbing stairs.  Stability/Clinical Decision Making Evolving/Moderate complexity    Clinical Decision Making Moderate    Rehab Potential Good    PT Frequency 2x / week    PT Duration 4 weeks    PT Treatment/Interventions ADLs/Self Care Home Management;Aquatic Therapy;Cryotherapy;Electrical Stimulation;Therapeutic activities;Therapeutic exercise;Balance training;Neuromuscular re-education;Functional mobility training;Stair training;Gait training;DME Instruction;Patient/family education;Manual techniques;Scar mobilization;Passive range of motion    PT Next Visit Plan Reassess goals, upper level glute strength    Consulted and Agree with Plan of Care Patient           Patient will benefit from skilled therapeutic intervention in order to improve the following deficits and impairments:  Abnormal gait, Decreased balance, Decreased endurance, Decreased mobility, Difficulty walking, Hypomobility, Obesity, Improper body mechanics, Decreased scar mobility, Decreased range of motion, Decreased activity tolerance, Decreased safety awareness, Decreased strength, Postural dysfunction, Pain  Visit Diagnosis: Status post total left knee replacement  Joint stiffness of knee, left  Muscle weakness (generalized)  Gait difficulty     Problem List Patient Active Problem List   Diagnosis Date Noted   Hypotension 06/16/2017   Rectal prolapse 06/16/2017   Abdominal pain 06/16/2017   Arthritis of knee 04/15/2017   Goals of care, counseling/discussion 03/15/2017   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[place]) 03/15/2017   Chronic back pain 03/09/2017   Gastroesophageal reflux 03/09/2017   Morbid obesity ([place]) 03/09/2017   Peripheral vascular disease ([place]) 03/09/2017   Vasomotor rhinitis 03/09/2017   Ventricular tachycardia ([place]) 03/09/2017   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    Atrial tachycardia ([place]) 09/09/2016   Near syncope 09/09/2016   Osteoarthritis of knee 08/11/2016   Sleep apnea    Family history of premature coronary artery disease    Pigmented villonodular synovitis of left knee 07/09/2015   Strain of left quadriceps tendon 04/18/2015   Traumatic arthritis of knee, left 04/18/2015   Pain in right knee 03/07/2015   Obesity (BMI 30-39.9) 12/05/2014   Orthostatic dizziness 12/05/2014   Trigger finger of left thumb 09/06/2014   Atrial fibrillation ([place]) 07/15/2014   Varicose veins of lower extremity with edema 07/15/2014   Fatigue 07/15/2014   Hearing loss, sensorineural 04/26/2014   Palpitations 04/06/2014   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   Benign non-nodular prostatic hyperplasia with lower urinary tract symptoms 03/10/2013   Venous insufficiency 11/17/2012   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   H/O ventricular tachycardia 05/24/2007   Coronary artery disease, non-occlusive 05/24/2007   Pura Spice, PT, DPT # [id number] Toney Reil, SPT 05/03/2020, 7:25 AM  [facility] [address] [place], [place], [address] Phone: [phone]   Fax:  [phone]  Name: JAVANTE NILSSON MRN: [id number] Date of Birth: 01-20-1951

## 2020-05-07 ENCOUNTER — Encounter: Payer: Self-pay | Admitting: Physical Therapy

## 2020-05-07 ENCOUNTER — Other Ambulatory Visit: Payer: Self-pay

## 2020-05-07 ENCOUNTER — Ambulatory Visit: Payer: No Typology Code available for payment source | Admitting: Physical Therapy

## 2020-05-07 DIAGNOSIS — M25662 Stiffness of left knee, not elsewhere classified: Secondary | ICD-10-CM

## 2020-05-07 DIAGNOSIS — Z96652 Presence of left artificial knee joint: Secondary | ICD-10-CM

## 2020-05-07 DIAGNOSIS — M6281 Muscle weakness (generalized): Secondary | ICD-10-CM

## 2020-05-07 DIAGNOSIS — R269 Unspecified abnormalities of gait and mobility: Secondary | ICD-10-CM

## 2020-05-07 NOTE — Therapy (Signed)
[place] [facility] [address] [place], [place], [address] Phone: [phone]   Fax:  [phone]  Physical Therapy Treatment  Patient Details  Name: Nicholas Reynolds MRN: [id number] Date of Birth: Jul 10, 1951 Referring Provider (PT): Rockwell Germany, PA-C   Encounter Date: 05/07/2020   PT End of Session - 05/07/20 1630    Visit Number 6    Number of Visits 9    Date for PT Re-Evaluation 05/07/20    Authorization - Visit Number 6    Authorization - Number of Visits 10    PT Start Time 3832    PT Stop Time 1700    PT Time Calculation (min) 51 min    Activity Tolerance Patient tolerated treatment well;Patient limited by pain    Behavior During Therapy [facility] for tasks assessed/performed           Past Medical History:  Diagnosis Date  . Arthritis    thumbs  . Asymptomatic varicose veins of bilateral lower extremities    Uncomfortable but in no ulcers  . Cancer ([place])   . Complication of anesthesia    slow to wake after hernia surg.  was taking "a bunch" of herbals at that time  . Coronary artery disease, non-occlusive July 2008   Nonobstructive by cath   . Family history of premature coronary artery disease    Father  . GERD (gastroesophageal reflux disease)   . 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 Obstructive sleep apnea    severe per study 09/05/14   . Paroxysmal atrial fibrillation ([place]) 2002   a) Initially diagnosed as Lone A Fib while in [place] (on ASA & BB after) - was on warfarin for a couple years and a Flecainide Pill-in Pocket PRN; b) recurrent A. fib with RVR August 2050 - status post cardioversion with  flecainide, on low-dose beta blocker  . Pre-syncope May 2015   Unrevealing monitor in May  . Uses hearing aid    bilateral  . Wears dentures    partial upper    Past Surgical History:  Procedure Laterality Date  . CARDIAC CATHETERIZATION  2008   Nonobstructive CAD  . CHOLECYSTECTOMY    . ELBOW SURGERY    . Event monitor  May 2015   Unrevealing  . HERNIA REPAIR    . KNEE ARTHROSCOPY Left 06/28/2015   Procedure: ARTHROSCOPY KNEE WITH SYNOVECTOMY;  Surgeon: Leanor Kail, MD;  Location: [facility];  Service: Orthopedics;  Laterality: Left;  CPAP  . [place] RESECTION     December 20 14th  . NM [employer]  June 28, 2014   [facility]: [facility] -- no evidence of ischemia or infarction. Normal EF ~58%  . PORTA CATH INSERTION N/A 03/15/2017   Procedure: Glori Luis Cath Insertion;  Surgeon: Algernon Huxley, MD;  Location: [place] [facility];  Service: Cardiovascular;  Laterality: N/A;  . RADIOFREQUENCY ABLATION of Ventricular Tachycardia  July 2000 the   RVOT VT ablation: [facility] [place]    . TRANSTHORACIC ECHOCARDIOGRAM  May 2015   [facility] - Dr. Sharyn Lull: EF 55%, mild LVH; otherwise mostly normal    There were  no vitals filed for this visit.   Subjective Assessment - 05/07/20 1624    Subjective Pt reports no L knee pain. Pt reports mild soreness in B glutes after previous session. Pt wishes to finish remaining 2 weeks of PT and wants to return to gym for independent exercise.    Patient is accompained by: Family member    Pertinent History Pt. known well to PT clinic.  See PMHx.    Limitations Standing;Walking;House hold activities    Patient Stated Goals Increase L knee AROM/ strength to improve pain-free walking    Currently in Pain? No/denies    Pain Score 0-No pain           Therex:  Nustep level 8 10 mins Total gym: 3x10 double leg support, 3x10 single leg  Bosu squats: 2x10 reps Nautilus: 95  lbs on single pulley backwards walking, sideways walking 3x each direction  Reviewed HEP     PT Long Term Goals - 04/11/20 2027      PT LONG TERM GOAL #1   Title Pt. independent with HEP to increase L knee AROM (0-120 deg.) to improve functional mobility.    Baseline R knee AROM: -2 to 125 deg.   L knee AROM: -3 to 114 deg.    Time 4    Period Weeks    Status New    Target Date 05/07/20      PT LONG TERM GOAL #2   Title Pt. will increase FOTO to 61 to improve pain-free mobility.    Baseline Initial FOTO: 47    Time 4    Period Weeks    Status New    Target Date 05/07/20      PT LONG TERM GOAL #3   Title Pt. able to ambulate 10 minutes with normalized gait pattern and no c/o L knee pain to improve overall functional mobility.    Baseline L antalgic gait wtih ues of SPC on R.    Time 4    Period Weeks    Status New    Target Date 05/07/20      PT LONG TERM GOAL #4   Title Pt. able to ascend/descend 10 stairs with recip. pattern and no c/o L knee pain or limitations.    Baseline Step to gait with stairs.    Time 4    Period Weeks    Status New    Target Date 05/07/20                 Plan - 05/07/20 1652    Clinical Impression Statement Pt. ambulated with reciprocal gait pattern. Pt. was able to complete many upper level quad and glute strength activities, with cueing to maintain upright trunk posture and utilize glute muscles. Pt. requests a list of exercises to complete at the gym on his own. Pt. will benefit from continued therapy to address HEP goals and strength deficits.    Stability/Clinical Decision Making Evolving/Moderate complexity    Clinical Decision Making Moderate    Rehab Potential Good    PT Frequency 2x / week    PT Duration 4 weeks    PT Treatment/Interventions ADLs/Self Care Home Management;Aquatic Therapy;Cryotherapy;Electrical Stimulation;Therapeutic activities;Therapeutic exercise;Balance training;Neuromuscular re-education;Functional mobility  training;Stair training;Gait training;DME Instruction;Patient/family education;Manual techniques;Scar mobilization;Passive range of motion    PT Next Visit Plan Reassess goals, upper level glute strength    Consulted and Agree with Plan of Care Patient           Patient will benefit  from skilled therapeutic intervention in order to improve the following deficits and impairments:  Abnormal gait, Decreased balance, Decreased endurance, Decreased mobility, Difficulty walking, Hypomobility, Obesity, Improper body mechanics, Decreased scar mobility, Decreased range of motion, Decreased activity tolerance, Decreased safety awareness, Decreased strength, Postural dysfunction, Pain  Visit Diagnosis: Status post total left knee replacement  Joint stiffness of knee, left  Muscle weakness (generalized)  Gait difficulty     Problem List Patient Active Problem List   Diagnosis Date Noted  . Hypotension 06/16/2017  . Rectal prolapse 06/16/2017  . Abdominal pain 06/16/2017  . Arthritis of knee 04/15/2017  . Goals of care, counseling/discussion 03/15/2017  . 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[place]) 03/15/2017  . Chronic back pain 03/09/2017  . Gastroesophageal reflux 03/09/2017  . Morbid obesity ([place]) 03/09/2017  . Peripheral vascular disease ([place]) 03/09/2017  . 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 Ventricular tachycardia ([place]) 03/09/2017  . 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 Atrial tachycardia ([place]) 09/09/2016  . Near syncope 09/09/2016  . Osteoarthritis of knee 08/11/2016  . Sleep apnea   . Family history of premature coronary artery disease   . Pigmented villonodular synovitis of left knee 07/09/2015  . Strain of left quadriceps tendon 04/18/2015  . Traumatic arthritis of knee, left 04/18/2015  . Pain in right knee 03/07/2015  . Obesity (BMI 30-39.9) 12/05/2014  . Orthostatic dizziness 12/05/2014  . Trigger finger of left thumb 09/06/2014  . Atrial  fibrillation ([place]) 07/15/2014  . Varicose veins of lower extremity with edema 07/15/2014  . Fatigue 07/15/2014  . Hearing loss, sensorineural 04/26/2014  . Palpitations 04/06/2014  . 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 Benign non-nodular prostatic hyperplasia with lower urinary tract symptoms 03/10/2013  . Venous insufficiency 11/17/2012  . 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 H/O ventricular tachycardia 05/24/2007  . Coronary artery disease, non-occlusive 05/24/2007   Pura Spice, PT, DPT # [phone] 05/08/2020, 3:09 PM  [facility] [address] [place], [place], [address] Phone: [phone]   Fax:  [phone]  Name: Nicholas Reynolds MRN: [id number] Date of Birth: [birth year]

## 2020-05-09 ENCOUNTER — Ambulatory Visit: Payer: No Typology Code available for payment source | Admitting: Physical Therapy

## 2020-05-09 ENCOUNTER — Other Ambulatory Visit: Payer: Self-pay

## 2020-05-09 ENCOUNTER — Encounter: Payer: Self-pay | Admitting: Physical Therapy

## 2020-05-09 DIAGNOSIS — Z96652 Presence of left artificial knee joint: Secondary | ICD-10-CM | POA: Diagnosis not present

## 2020-05-09 DIAGNOSIS — M25662 Stiffness of left knee, not elsewhere classified: Secondary | ICD-10-CM

## 2020-05-09 DIAGNOSIS — M6281 Muscle weakness (generalized): Secondary | ICD-10-CM

## 2020-05-09 DIAGNOSIS — R269 Unspecified abnormalities of gait and mobility: Secondary | ICD-10-CM

## 2020-05-09 NOTE — Therapy (Signed)
[facility] [address]. [place], [place], [address] Phone: [phone]   Fax:  [phone]  Physical Therapy Treatment  Patient Details  Name: Nicholas Reynolds MRN: [id number] Date of Birth: [birth year] Referring Provider (PT): Rockwell Germany, PA-C   Encounter Date: 05/09/2020   PT End of Session - 05/09/20 1752    Visit Number 7    Number of Visits 7    Date for PT Re-Evaluation 05/09/20    Authorization - Visit Number 1    Authorization - Number of Visits 1    PT Start Time 9381    PT Stop Time 1655    PT Time Calculation (min) 57 min    Activity Tolerance Patient tolerated treatment well    Behavior During Therapy WFL for tasks assessed/performed           Past Medical History:  Diagnosis Date   Arthritis    thumbs   Asymptomatic varicose veins of bilateral lower extremities    Uncomfortable but in no ulcers   Cancer ([place])    Complication of anesthesia    slow to wake after hernia surg.  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 fib with RVR August 2050 - status post cardioversion with flecainide, on low-dose beta  blocker   Pre-syncope May 2015   Unrevealing monitor in May   Uses hearing aid    bilateral   Wears dentures    partial upper    Past Surgical History:  Procedure Laterality Date   CARDIAC CATHETERIZATION  2008   Nonobstructive CAD   CHOLECYSTECTOMY     ELBOW SURGERY     Event monitor  May 2015   Unrevealing   HERNIA REPAIR     KNEE ARTHROSCOPY Left 06/28/2015   Procedure: ARTHROSCOPY KNEE WITH SYNOVECTOMY;  Surgeon: Leanor Kail, MD;  Location: [place];  Service: Orthopedics;  Laterality: Left;  CPAP   LAPAROSCOPIC GASTRIC SLEEVE RESECTION     December 20 14th   [place]  June 28, 2014   [facility]: [facility] -- no evidence of ischemia or infarction. Normal EF ~58%   PORTA CATH INSERTION N/A 03/15/2017   Procedure: Glori Luis Cath Insertion;  Surgeon: Algernon Huxley, MD;  Location: [facility];  Service: Cardiovascular;  Laterality: N/A;   RADIOFREQUENCY ABLATION of Ventricular Tachycardia  July 2000 the   RVOT VT ablation: [facility] [place] ECHOCARDIOGRAM  May 2015   [facility] - Dr. Sharyn Lull: EF 55%, mild LVH; otherwise mostly normal    There were no vitals filed  for this visit.   Subjective Assessment - 05/09/20 1750    Subjective Pt. reports no L knee pain. Reports that he is ready to join a gym and start losing weight.    Pertinent History Pt. known well to PT clinic.  See PMHx.    Limitations Standing;Walking;House hold activities    Patient Stated Goals Increase L knee AROM/ strength to improve pain-free walking    Currently in Pain? No/denies              Ther. Ex: Nustep: 10 mins L7 Squat with Chair Touch - 1 x daily - 7 x weekly - 3 sets - 10 reps  Walking Forward Lunge - 1 x daily - 7 x weekly - 3 sets - 10 reps  Lateral Lunge - 1 x daily - 7 x weekly - 3 sets - 10 reps  Forward Step Down Touch with Heel - 1 x daily - 7 x weekly - 3 sets - 10 reps   Soleus Stretch on Wall - 1 x daily - 7 x weekly - 3 sets - 10 reps  Gastroc Stretch on Wall - 1 x daily - 7 x weekly - 3 sets - 10 reps  Hooklying Hamstring Stretch with Strap - 1 x daily - 7 x weekly - 3 sets - 10 reps See HEP handouts    Manual:   STM to left quad (marked decrease in tenderness) Good incision healing (small scab at proximal incision).           PT Education - 05/09/20 1823    Education Details See HEP    Person(s) Educated Patient    Methods Explanation;Demonstration;Handout    Comprehension Verbalized understanding;Returned demonstration               PT Long Term Goals - 05/09/20 1824      PT LONG TERM GOAL #1   Title Pt. independent with HEP to increase L knee AROM (0-120 deg.) to improve functional mobility.    Baseline R knee AROM: -2 to 125 deg.   L knee AROM: -3 to 114 deg.  6/17:  0 to >120 deg.    Time 4    Period Weeks    Status Achieved    Target Date 05/09/20      PT LONG TERM GOAL #2   Title Pt. will increase FOTO to 61 to improve pain-free mobility.    Baseline Initial FOTO: 47.  6/17: 75    Time 4    Period Weeks    Status Achieved    Target Date 05/09/20      PT LONG TERM GOAL #3   Title Pt. able to ambulate 10 minutes with normalized gait pattern and no c/o L knee pain to improve overall functional mobility.    Baseline L antalgic gait wtih ues of SPC on R.  6/17: normalized gait    Time 4    Period Weeks    Status Achieved    Target Date 05/09/20      PT LONG TERM GOAL #4   Title Pt. able to ascend/descend 10 stairs with recip. pattern and no c/o L knee pain or limitations.    Baseline Step to gait with stairs.  6/17: recip. pattern    Time 4    Period Weeks    Status Achieved    Target Date 05/09/20                 Plan - 05/09/20 1753  Clinical Impression Statement Pt. ambulated with reciprocal gait pattern, demonstrates good return with HEP and knee control during dynamic activities. Pt. was  instructed in a gym HEP to complete on his own, and pt. mentioned wanting to hire a personal trainer at his gym. Pt. was instructed to call the clinic in case of worsening or any new problems.  Discharge from PT at this time.    Stability/Clinical Decision Making Evolving/Moderate complexity    Clinical Decision Making Moderate    Rehab Potential Good    PT Frequency 2x / week    PT Duration 4 weeks    PT Treatment/Interventions ADLs/Self Care Home Management;Aquatic Therapy;Cryotherapy;Electrical Stimulation;Therapeutic activities;Therapeutic exercise;Balance training;Neuromuscular re-education;Functional mobility training;Stair training;Gait training;DME Instruction;Patient/family education;Manual techniques;Scar mobilization;Passive range of motion    PT Next Visit Plan Discharge    Consulted and Agree with Plan of Care Patient           Patient will benefit from skilled therapeutic intervention in order to improve the following deficits and impairments:  Abnormal gait, Decreased balance, Decreased endurance, Decreased mobility, Difficulty walking, Hypomobility, Obesity, Improper body mechanics, Decreased scar mobility, Decreased range of motion, Decreased activity tolerance, Decreased safety awareness, Decreased strength, Postural dysfunction, Pain  Visit Diagnosis: Status post total left knee replacement  Joint stiffness of knee, left  Muscle weakness (generalized)  Gait difficulty     Problem List Patient Active Problem List   Diagnosis Date Noted   Hypotension 06/16/2017   Rectal prolapse 06/16/2017   Abdominal pain 06/16/2017   Arthritis of knee 04/15/2017   Goals of care, counseling/discussion 03/15/2017   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[place]) 03/15/2017   Chronic back pain 03/09/2017   Gastroesophageal reflux 03/09/2017   Morbid obesity ([place]) 03/09/2017   Peripheral vascular disease ([place]) 03/09/2017   Vasomotor rhinitis  03/09/2017   Ventricular tachycardia ([place]) 03/09/2017   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   Atrial tachycardia ([place]) 09/09/2016   Near syncope 09/09/2016   Osteoarthritis of knee 08/11/2016   Sleep apnea    Family history of premature coronary artery disease    Pigmented villonodular synovitis of left knee 07/09/2015   Strain of left quadriceps tendon 04/18/2015   Traumatic arthritis of knee, left 04/18/2015   Pain in right knee 03/07/2015   Obesity (BMI 30-39.9) 12/05/2014   Orthostatic dizziness 12/05/2014   Trigger finger of left thumb 09/06/2014   Atrial fibrillation ([place]) 07/15/2014   Varicose veins of lower extremity with edema 07/15/2014   Fatigue 07/15/2014   Hearing loss, sensorineural 04/26/2014   Palpitations 04/06/2014   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   Benign non-nodular prostatic hyperplasia with lower urinary tract symptoms 03/10/2013   Venous insufficiency 11/17/2012   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   H/O ventricular tachycardia 05/24/2007   Coronary artery disease, non-occlusive 05/24/2007   Pura Spice, PT, DPT # [id number] Carlyle Basques, SPT 05/09/2020, 6:29 PM  [place] [facility] [address]. [place], [place], [address] Phone: [phone]   Fax:  [phone]  Name: Nicholas Reynolds MRN: [id number] Date of Birth: 09/28/1951

## 2020-05-09 NOTE — Patient Instructions (Signed)
Access Code: [email or web address]: [email or web address]: 06/17/2021Prepared by: Haskell Flirt  Squat with Chair Touch - 1 x daily - 7 x weekly - 3 sets - 10 reps  Walking Forward Lunge - 1 x daily - 7 x weekly - 3 sets - 10 reps  Lateral Lunge - 1 x daily - 7 x weekly - 3 sets - 10 reps  Forward Step Down Touch with Heel - 1 x daily - 7 x weekly - 3 sets - 10 reps  Soleus Stretch on Wall - 1 x daily - 7 x weekly - 3 sets - 10 reps  Gastroc Stretch on Wall - 1 x daily - 7 x weekly - 3 sets - 10 reps  Hooklying Hamstring Stretch with Strap - 1 x daily - 7 x weekly - 3 sets - 10 reps

## 2022-05-31 ENCOUNTER — Other Ambulatory Visit: Payer: Self-pay

## 2022-05-31 ENCOUNTER — Emergency Department: Payer: No Typology Code available for payment source

## 2022-05-31 ENCOUNTER — Emergency Department
Admission: EM | Admit: 2022-05-31 | Discharge: 2022-05-31 | Disposition: A | Discharge status: Home / Self Care | Payer: No Typology Code available for payment source | Attending: Emergency Medicine | Admitting: Emergency Medicine

## 2022-05-31 DIAGNOSIS — R0781 Pleurodynia: Secondary | ICD-10-CM | POA: Diagnosis present

## 2022-05-31 DIAGNOSIS — R0602 Shortness of breath: Secondary | ICD-10-CM | POA: Diagnosis not present

## 2022-05-31 LAB — BASIC METABOLIC PANEL
Anion gap: 5 (ref 5–15)
BUN: 29 mg/dL — ABNORMAL HIGH (ref 8–23)
CO2: 27 mmol/L (ref 22–32)
Calcium: 9.3 mg/dL (ref 8.9–10.3)
Chloride: 109 mmol/L (ref 98–111)
Creatinine, Ser: 1.05 mg/dL (ref 0.61–1.24)
GFR, Estimated: >60 mL/min (ref >60)
Glucose, Bld: 107 mg/dL — ABNORMAL HIGH (ref 70–99)
Potassium: 4.9 mmol/L (ref 3.5–5.1)
Sodium: 141 mmol/L (ref 135–145)

## 2022-05-31 LAB — CBC
HCT: 38.3 % — ABNORMAL LOW (ref 39.0–52.0)
Hemoglobin: 12.3 g/dL — ABNORMAL LOW (ref 13.0–17.0)
MCH: 29.1 pg (ref 26.0–34.0)
MCHC: 32.1 g/dL (ref 30.0–36.0)
MCV: 90.5 fL (ref 80.0–100.0)
Platelets: 211 10*3/uL (ref 150–400)
RBC: 4.23 MIL/uL (ref 4.22–5.81)
RDW: 13.9 % (ref 11.5–15.5)
WBC: 6 10*3/uL (ref 4.0–10.5)
nRBC: 0 % (ref 0.0–0.2)

## 2022-05-31 LAB — TROPONIN I (HIGH SENSITIVITY): Troponin I (High Sensitivity): 6 ng/L (ref <18)

## 2022-05-31 MED ORDER — COLCHICINE 0.6 MG PO TABS: 0.6000 mg | ORAL_TABLET | Freq: Two times a day (BID) | ORAL | 0 refills | Status: DC

## 2022-05-31 NOTE — ED Triage Notes (Signed)
Pt comes with c/o left sided CP and SOb that started about two weeks ago. Pt states that it gets worse when he takes a breath. Pt states 6/10 pain. Pt states no nausea/v/d.  Pt states lightheaded and dizziness.

## 2022-05-31 NOTE — ED Provider Notes (Signed)
[facility] Provider Note    Event Date/Time   First MD Initiated Contact with Patient 05/31/22 1143     (approximate)   History   Chest Pain and Shortness of Breath   HPI  Nicholas Reynolds is a 71 y.o. male with history of atrial fibrillation status post ablation in February at the [place] who presents with chest pain over the last couple weeks.  It is left-sided in the lower chest.  It is sharp and pleuritic.  It only occurs when he takes a deep breath.  It is not associated with shortness of breath, weakness or lightheadedness, or any nausea or vomiting.  The patient states that he has been taking ibuprofen with minimal relief.  He talked to the PA at his cardiology office at the [facility] and was to be prescribed colchicine but has not taken it yet.  He denies any fever or chills and has no leg swelling.   Physical Exam   Triage Vital Signs: ED Triage Vitals  Enc Vitals Group     BP 05/31/22 1136 126/70     Pulse Rate 05/31/22 1136 (!) 55     Resp 05/31/22 1136 17     Temp 05/31/22 1136 98 F (36.7 C)     Temp Source 05/31/22 1136 Oral     SpO2 05/31/22 1136 94 %     Weight --      Height --      Head Circumference --      Peak Flow --      Pain Score 05/31/22 1128 6     Pain Loc --      Pain Edu? --      Excl. in [place]? --     Most recent vital signs: Vitals:   05/31/22 1200 05/31/22 1202  BP: 98/83   Pulse: (!) 53 (!) 53  Resp: (!) 8 11  Temp:    SpO2: 96% 97%     General: Alert and oriented, well-appearing. CV:  Good peripheral perfusion.  Normal heart sounds. Resp:  Normal effort.  Lungs CTAB. Abd:  No distention.  Other:  No peripheral edema.  No calf or popliteal swelling or tenderness.  No chest wall tenderness.   ED Results / Procedures / Treatments   Labs (all labs ordered are listed, but only abnormal results are displayed) Labs Reviewed  BASIC METABOLIC PANEL - Abnormal; Notable for the following components:      Result  Value   Glucose, Bld 107 (*)    BUN 29 (*)    All other components within normal limits  CBC - Abnormal; Notable for the following components:   Hemoglobin 12.3 (*)    HCT 38.3 (*)    All other components within normal limits  TROPONIN I (HIGH SENSITIVITY)     EKG  ED ECG REPORT I, Arta Silence, the attending physician, personally viewed and interpreted this ECG.  Date: 05/31/2022 EKG Time: 1134 Rate: 57 Rhythm: normal sinus rhythm QRS Axis: Borderline left axis Intervals: normal ST/T Wave abnormalities: normal Narrative Interpretation: no evidence of acute ischemia    RADIOLOGY  Chest x-ray: I independently viewed and interpreted the images; there is no focal consolidation or edema  PROCEDURES:  Critical Care performed: No  Procedures   MEDICATIONS ORDERED IN ED: Medications - No data to display   IMPRESSION / MDM / [place] / ED COURSE  I reviewed the triage vital signs and the nursing notes.  71 year old  male with PMH as noted above presents with pleuritic left-sided chest pain over the last couple of weeks with no significant associated symptoms.  I reviewed the past medical records.  The patient has no ED visits or admissions here.  The patient did show me some of his recent records in his online chart from the [place] and I confirmed the recent ablation.  His cardiologist is concerned for possible pleurisy and recommends him to start colchicine but instructed him to come to the ED if his symptoms persisted.  Physical exam is unremarkable.  The patient's vital signs are normal.  EKG is nonischemic.  Differential diagnosis includes, but is not limited to, musculoskeletal chest wall pain, pleurisy, GERD, less likely ACS.  I do not suspect PE given the lack of tachycardia or hypoxia and the time course of the symptoms.  The patient has no signs or symptoms of DVT.  Patient's presentation is most consistent with acute complicated illness / injury  requiring diagnostic workup.  We will obtain basic labs, troponin, chest x-ray, and reassess.  The patient is on the cardiac monitor to evaluate for evidence of arrhythmia and/or significant heart rate changes.  ----------------------------------------- 1:22 PM on 05/31/2022 -----------------------------------------  Troponin is negative.  Given the duration of the symptoms and nonischemic EKG there is no indication for a repeat.  BMP shows normal electrolytes.  There is no leukocytosis or other abnormal finding on the CBC.  On reassessment the patient is comfortable appearing.  He is stable for discharge at this time.  There is no evidence of ACS.  As above there is no evidence of PE or vascular etiology.  The patient is also already anticoagulated on Eliquis.  I will prescribe the colchicine as specified by his cardiology provider, since it will be quicker for him to get it prescribed to his normal pharmacy then from the [place].  I gave the patient thorough return precautions and he expressed understanding.   FINAL CLINICAL IMPRESSION(S) / ED DIAGNOSES   Final diagnoses:  Pleuritic chest pain     Rx / DC Orders   ED Discharge Orders          Ordered    colchicine 0.6 MG tablet  2 times daily        05/31/22 1322             Note:  This document was prepared using Dragon voice recognition software and may include unintentional dictation errors.    Arta Silence, MD 05/31/22 1324

## 2022-05-31 NOTE — Discharge Instructions (Addendum)
Follow-up with your regular cardiologist.  Take the colchicine as prescribed.  Return to the ER for new, worsening, or persistent severe chest pain, difficulty breathing, weakness or lightheadedness, nausea or vomiting, fever, or any other new or worsening symptoms that concern you.

## 2022-09-19 ENCOUNTER — Ambulatory Visit
Admission: RE | Admit: 2022-09-19 | Discharge: 2022-09-19 | Disposition: A | Discharge status: Home / Self Care | Payer: No Typology Code available for payment source | Source: Ambulatory Visit

## 2022-09-19 VITALS — BP 120/78 | HR 50 | Temp 97.7°F | Resp 15

## 2022-09-19 DIAGNOSIS — R0602 Shortness of breath: Secondary | ICD-10-CM | POA: Diagnosis not present

## 2022-09-19 DIAGNOSIS — H04123 Dry eye syndrome of bilateral lacrimal glands: Secondary | ICD-10-CM | POA: Insufficient documentation

## 2022-09-19 DIAGNOSIS — I77810 Thoracic aortic ectasia: Secondary | ICD-10-CM | POA: Insufficient documentation

## 2022-09-19 DIAGNOSIS — Z96653 Presence of artificial knee joint, bilateral: Secondary | ICD-10-CM | POA: Insufficient documentation

## 2022-09-19 DIAGNOSIS — J029 Acute pharyngitis, unspecified: Secondary | ICD-10-CM | POA: Diagnosis not present

## 2022-09-19 DIAGNOSIS — Z461 Encounter for fitting and adjustment of hearing aid: Secondary | ICD-10-CM | POA: Insufficient documentation

## 2022-09-19 DIAGNOSIS — Z96659 Presence of unspecified artificial knee joint: Secondary | ICD-10-CM | POA: Insufficient documentation

## 2022-09-19 DIAGNOSIS — R001 Bradycardia, unspecified: Secondary | ICD-10-CM | POA: Diagnosis not present

## 2022-09-19 DIAGNOSIS — H9201 Otalgia, right ear: Secondary | ICD-10-CM | POA: Diagnosis not present

## 2022-09-19 DIAGNOSIS — I517 Cardiomegaly: Secondary | ICD-10-CM | POA: Insufficient documentation

## 2022-09-19 DIAGNOSIS — H9193 Unspecified hearing loss, bilateral: Secondary | ICD-10-CM | POA: Insufficient documentation

## 2022-09-19 DIAGNOSIS — Z8572 Personal history of non-Hodgkin lymphomas: Secondary | ICD-10-CM | POA: Insufficient documentation

## 2022-09-19 DIAGNOSIS — Z9049 Acquired absence of other specified parts of digestive tract: Secondary | ICD-10-CM | POA: Insufficient documentation

## 2022-09-19 DIAGNOSIS — Z8579 Personal history of other malignant neoplasms of lymphoid, hematopoietic and related tissues: Secondary | ICD-10-CM | POA: Insufficient documentation

## 2022-09-19 DIAGNOSIS — M5416 Radiculopathy, lumbar region: Secondary | ICD-10-CM | POA: Insufficient documentation

## 2022-09-19 DIAGNOSIS — Z139 Encounter for screening, unspecified: Secondary | ICD-10-CM | POA: Insufficient documentation

## 2022-09-19 DIAGNOSIS — H269 Unspecified cataract: Secondary | ICD-10-CM | POA: Insufficient documentation

## 2022-09-19 DIAGNOSIS — Z8679 Personal history of other diseases of the circulatory system: Secondary | ICD-10-CM

## 2022-09-19 NOTE — ED Notes (Signed)
Patient is being discharged from the Urgent Care and sent to the Emergency Department via POV . Per [facility], patient is in need of higher level of care due to Providers assessmnet. Patient is aware and verbalizes understanding of plan of care.  Vitals:   09/19/22 0850  BP: 120/78  Pulse: (!) 50  Resp: 15  Temp: 97.7 F (36.5 C)  SpO2: 98%

## 2022-09-19 NOTE — ED Triage Notes (Signed)
Pt. Presents to UC w/ c/o intermittent ear pressure that travels to his throat and down to his right shoulder.

## 2022-09-19 NOTE — ED Provider Notes (Signed)
Nicholas Reynolds    CSN: [id number] Arrival date & time: 09/19/22  7619      History   Chief Complaint Chief Complaint  Patient presents with   Sore Throat    Right side feels like Swollen Glands, hard to swallow with pain into the right shoulder - Entered by patient   Otalgia    HPI Nicholas Reynolds is a 71 y.o. male.   Patient presents with several different chief complaints today.  Patient reports that he has been having some right-sided throat pain that radiates up to ear causing some right-sided ear pressure.  He also reports that it feels like this discomfort is radiating down to his right shoulder.  He denies nasal congestion, runny nose, cough, fever.  He does report that his wife has had similar symptoms recently. Has not taken any medications for symptoms.  I was called to room by nursing staff given that patient has low heart rate.  Patient reported to nursing staff that he has a history of atrial fibrillation and has had a low heart rate when he has been in atrial fibrillation in the past.  He states that he had an ablation in February of this year but has not had any complications since.  He denies that he has been having any chest pain or palpitations recently.  Although, he does report that he started having some shortness of breath a few days prior.  Does take metoprolol as well.   Sore Throat  Otalgia   Past Medical History:  Diagnosis Date   Arthritis    thumbs   Asymptomatic varicose veins of bilateral lower extremities    Uncomfortable but in no ulcers   Cancer (HCC)    Complication of anesthesia    slow to wake after hernia surg.  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   Bilateral hearing loss 09/19/2022   Cataract 09/19/2022   Dry eye syndrome of bilateral lacrimal glands 09/19/2022   Encounter for fitting and adjustment of hearing aid 09/19/2022   History of cholecystectomy 09/19/2022   History of total knee arthroplasty 09/19/2022   Left ventricular hypertrophy 09/19/2022   Personal history of non-Hodgkin lymphomas 09/19/2022   Personal history of other malignant neoplasms of lymphoid, hematopoietic and related tissues 09/19/2022  Presence of artificial knee joint, bilateral 09/19/2022   Radiculopathy, lumbar region 09/19/2022   Screening due 09/19/2022   Presence of intraocular lens 06/06/2019   Age-related nuclear cataract of both eyes 05/31/2019   Disorder of refraction 05/31/2019   Dry eye syndrome of unspecified lacrimal gland 05/31/2019   Cortical senile cataract of left eye 05/16/2019   Hypotension 06/16/2017   Rectal prolapse 06/16/2017   Abdominal pain 06/16/2017   Arthritis of knee 04/15/2017   Goals of care, counseling/discussion 03/15/2017   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   Chronic back pain 03/09/2017   Gastroesophageal reflux 03/09/2017   Morbid obesity ([place]) 03/09/2017   Peripheral vascular disease ([place]) 03/09/2017   Vasomotor rhinitis 03/09/2017   Ventricular tachycardia ([place]) 03/09/2017   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   Atrial tachycardia 09/09/2016   Near syncope 09/09/2016   Osteoarthritis of knee 08/11/2016   Sleep apnea    Family history of premature coronary artery disease    Pigmented villonodular synovitis of left knee 07/09/2015   Strain of left quadriceps tendon 04/18/2015   Traumatic arthritis of knee, left 04/18/2015   Pain in right knee 03/07/2015   Obesity (BMI 30-39.9) 12/05/2014   Orthostatic dizziness 12/05/2014   Trigger finger of left thumb 09/06/2014   Atrial fibrillation ([place]) 07/15/2014   Varicose veins of lower extremity with edema 07/15/2014   Fatigue 07/15/2014   Hearing loss, sensorineural 04/26/2014   Palpitations 04/06/2014   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   Benign non-nodular prostatic hyperplasia with lower urinary tract symptoms 03/10/2013   Venous insufficiency 11/17/2012   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   H/O ventricular tachycardia 05/24/2007   Coronary artery disease, non-occlusive 05/24/2007    Past Surgical History:  Procedure Laterality Date   CARDIAC CATHETERIZATION  2008   Nonobstructive CAD   CHOLECYSTECTOMY     ELBOW SURGERY     Event monitor  May 2015   Unrevealing   HERNIA REPAIR     KNEE ARTHROSCOPY Left 06/28/2015   Procedure: ARTHROSCOPY KNEE WITH SYNOVECTOMY;  Surgeon: Leanor Kail, MD;  Location: [facility];  Service: Orthopedics;  Laterality: Left;  CPAP   LAPAROSCOPIC GASTRIC SLEEVE RESECTION     December 20 14th   NM [place]  June 28, 2014   [facility]: [facility] -- no evidence of ischemia or infarction. Normal EF ~58%   PORTA CATH INSERTION N/A 03/15/2017   Procedure: Glori Luis Cath Insertion;  Surgeon: Algernon Huxley, MD;  Location:  [facility];  Service: Cardiovascular;  Laterality: N/A;   RADIOFREQUENCY ABLATION of Ventricular Tachycardia  July 2000 the   RVOT VT ablation: [facility] [place], [place] ECHOCARDIOGRAM  May 2015   [facility] - Dr. Sharyn Lull: EF 55%, mild LVH; otherwise mostly normal       Home Medications    Prior to Admission medications   Medication Sig Start Date End Date Taking? Authorizing Provider  apixaban (ELIQUIS) 5 MG TABS tablet TAKE ONE TABLET BY MOUTH EVERY 12 HOURS CAUTION BLOOD THINNER 06/08/22  Yes [provider]  dorzolamide-timolol (COSOPT) 2-0.5 % ophthalmic solution Apply to eye. 06/06/19  Yes [provider]  finasteride (PROSCAR) 5 MG tablet TAKE ONE TABLET BY MOUTH AT BEDTIME FOR ENLARGED PROSTATE 05/15/22  Yes [provider]  Lifitegrast 5 % SOLN INSTILL 1 DROP IN [facility] EYE TWO TIMES A DAY FOR DRY EYE 05/01/22  Yes [provider]  Liraglutide -Weight Management (SAXENDA) 18 MG/3ML SOPN INJECT '3MG'$  UNDER SKIN EVERY DAY FOR WEIGHT LOSS 02/11/22  Yes [provider]  metoprolol tartrate (LOPRESSOR) 50 MG tablet Take 1 tablet twice a day by oral route. 03/11/17  Yes [provider]  moxifloxacin (VIGAMOX) 0.5 % ophthalmic solution 1 gtt QID in op eye 3 days prior to operation and 1 gtt morning of operation OD July 1, OS July 13 05/16/19  Yes [provider]  omeprazole (PRILOSEC) 40 MG capsule  07/20/17  Yes [provider]  pregabalin (LYRICA) 50 MG capsule Take 1 capsule by mouth 2 (two) times daily. 09/17/22  Yes [provider]  traMADol (ULTRAM) 50 MG tablet TAKE 1 TABLET BY MOUTH TWO TIMES A DAY AS NEEDED FOR CHRONIC PAIN 09/17/22  Yes [provider]  allopurinol (ZYLOPRIM) 300 MG tablet Take 1 tablet (300 mg total) by mouth daily. Patient not taking: Reported on 08/30/2017 03/17/17   Lloyd Huger, MD  amoxicillin (AMOXIL) 500 MG  tablet TAKE 1 TABLET 3 TIMES A DAY UNTIL FINISHED    [provider]  amoxicillin-clavulanate (AUGMENTIN) 875-125 MG tablet     [provider]  colchicine 0.6 MG tablet Take 1 tablet (0.6 mg total) by mouth 2 (two) times daily for 7 days. 05/31/22 06/07/22  Arta Silence, MD  cyclobenzaprine (FLEXERIL) 5 MG tablet Take 1 tablet (5 mg total) by mouth 3 (three) times daily as needed for muscle spasms. Patient not taking: Reported on 08/30/2017 05/27/17   Lloyd Huger, MD  dabigatran (PRADAXA) 150 MG CAPS capsule Take by mouth.    [provider]  Docusate Sodium (DSS) 100 MG CAPS Take 1 capsule every day by oral route.    [provider]  enoxaparin (LOVENOX) 150 MG/ML injection     [provider]  fentaNYL (DURAGESIC - DOSED MCG/HR) 25 MCG/HR patch Place 1 patch (25 mcg total) onto the skin every 3 (three) days. Patient not taking: Reported on 08/30/2017 07/19/17   Lloyd Huger, MD  flecainide (TAMBOCOR) 50 MG tablet     [provider]  gabapentin (NEURONTIN) 300 MG capsule     [provider]  HYDROcodone-acetaminophen (NORCO) 7.5-325 MG tablet Take 1 tablet by mouth every 8 (eight) hours as needed.    [provider]  hydrocortisone (ANUSOL-HC) 2.5 % rectal cream Apply topically.    [provider]  hydrocortisone (ANUSOL-HC) 25 MG suppository Place 1 suppository (25 mg total) rectally 2 (two) times daily. Patient not taking: Reported on 08/30/2017 06/17/17   Gladstone Lighter, MD  Ibuprofen 200 MG CAPS Take 4 capsules every 6 hours by oral route as needed.    [provider]  levofloxacin (LEVAQUIN) 500 MG tablet     [provider]  LINZESS 290 MCG CAPS capsule  07/23/17   [provider]  Loratadine 10 MG CAPS Take 1 capsule every day by oral route.    [provider]  meloxicam (MOBIC) 15 MG tablet Take 1 tablet every day by oral route with food.    [provider]  metoCLOPramide (REGLAN) 10 MG tablet TAKE 1 TABLET(S) EVERY 6 HOURS BY ORAL ROUTE AS NEEDED FOR NAUSEA.    [provider]  metoprolol succinate (TOPROL-XL) 50 MG 24 hr tablet Take 50 mg by mouth daily. Take with or immediately following a meal.    [provider]  Multiple Vitamin (MULTI-VITAMIN) tablet Take 1 tablet by  mouth daily.    [provider]  ondansetron (ZOFRAN) 8 MG tablet Take 1 tablet (8 mg total) by mouth 2 (two) times daily as needed for refractory nausea / vomiting. Start on day 3 after cyclophosphamide chemotherapy. Patient not taking: Reported on 08/30/2017 04/29/17   Lloyd Huger, MD  ondansetron (ZOFRAN) 8 MG tablet Take by mouth.    [provider]  Oxycodone HCl 10 MG TABS Take 1 tablet (10 mg total) by mouth every 6 (six) hours as needed. Patient not taking: Reported on 08/30/2017 06/07/17   Lloyd Huger, MD  prochlorperazine (COMPAZINE) 10 MG tablet Take by mouth.    [provider]  REFRESH LIQUIGEL 1 % GEL Apply to eye. 06/14/22   [provider]  senna (SENOKOT) 8.6 MG tablet Take by mouth.    [provider]  sucralfate (CARAFATE) 1 g tablet     [provider]  sulfamethoxazole-trimethoprim (BACTRIM DS) 800-160 MG tablet     [provider]  tamsulosin (FLOMAX) 0.4 MG CAPS capsule Take 0.4 mg by mouth daily. PM    [provider]  warfarin (COUMADIN) 5 MG tablet Take 5 mg by mouth daily. Mon and Fri 7.5 mg, all other days 5 mg    [provider]    Family History Family History  Problem Relation Age of Onset   Breast cancer Mother    Bone cancer Mother    COPD Father    Arrhythmia Father    Heart failure Father    Heart attack Father     Social History Social History   Tobacco Use   Smoking status: Former    Types: Cigarettes    Quit date: 11/24/1999    Years since quitting: 22.8   Smokeless tobacco: Never  Vaping Use   Vaping  Use: Never used  Substance Use Topics   Alcohol use: No   Drug use: No     Allergies   Patient has no known allergies.   Review of Systems Review of Systems Per HPI  Physical Exam Triage Vital Signs ED Triage Vitals  Enc Vitals Group     BP 09/19/22 0850 120/78     Pulse Rate 09/19/22 0850 (!) 50     Resp 09/19/22 0850 15     Temp 09/19/22 0850 97.7 F (36.5 C)     Temp src --      SpO2 09/19/22 0850 98 %     Weight --      Height --      Head Circumference --      Peak Flow --      Pain Score 09/19/22 0851 0     Pain Loc --      Pain Edu? --      Excl. in [place]? --    No data found.  Updated Vital Signs BP 120/78   Pulse (!) 50 Comment: Provider notfied and aware  Temp 97.7 F (36.5 C)   Resp 15   SpO2 98%   Visual Acuity Right Eye Distance:   Left Eye Distance:   Bilateral Distance:    Right Eye Near:   Left Eye Near:    Bilateral Near:     Physical Exam Constitutional:      General: He is not in acute distress.    Appearance: Normal appearance. He is not toxic-appearing or diaphoretic.  HENT:     Head: Normocephalic and atraumatic.     Right Ear: Tympanic membrane  and ear canal normal.     Left Ear: Tympanic membrane and ear canal normal.     Nose: Congestion present.     Mouth/Throat:     Mouth: Mucous membranes are moist.     Pharynx: Posterior oropharyngeal erythema present. No pharyngeal swelling, oropharyngeal exudate or uvula swelling.     Tonsils: No tonsillar exudate or tonsillar abscesses.  Eyes:     Extraocular Movements: Extraocular movements intact.     Conjunctiva/sclera: Conjunctivae normal.     Pupils: Pupils are equal, round, and reactive to light.  Cardiovascular:     Rate and Rhythm: Regular rhythm. Bradycardia present.     Pulses: Normal pulses.     Heart sounds: Normal heart sounds.  Pulmonary:     Effort: Pulmonary effort is normal. No respiratory distress.     Breath sounds: Normal breath sounds. No stridor. No  wheezing, rhonchi or rales.  Abdominal:     General: Abdomen is flat. Bowel sounds are normal.     Palpations: Abdomen is soft.  Musculoskeletal:        General: Normal range of motion.     Cervical back: Normal range of motion.  Skin:    General: Skin is warm and dry.  Neurological:     General: No focal deficit present.     Mental Status: He is alert and oriented to person, place, and time. Mental status is at baseline.  Psychiatric:        Mood and Affect: Mood normal.        Behavior: Behavior normal.      UC Treatments / Results  Labs (all labs ordered are listed, but only abnormal results are displayed) Labs Reviewed - No data to display  EKG   Radiology No results found.  Procedures Procedures (including critical care time)  Medications Ordered in UC Medications - No data to display  Initial Impression / Assessment and Plan / UC Course  I have reviewed the triage vital signs and the nursing notes.  Pertinent labs & imaging results that were available during my care of the patient were reviewed by me and considered in my medical decision making (see chart for details).     Suspicious that patient's chief complaint symptoms are most likely related to a viral illness.  Although, there is concern that patient has bradycardia and on auscultation of the heart, heart rate does sound slightly irregular.  Do not have EKG capabilities here in urgent care today given technical complications.  Given this, I do think patient needs more extensive evaluation which cannot be provided here in urgent care today.  Therefore, patient was advised to go to the emergency department for further evaluation and management.  Advised given urgency of heart rate that we will defer evaluation and management of upper respiratory symptoms, sore throat, ear pain to the ED as well.  Patient verbalized understanding and was agreeable with plan.  Vital signs and patient stable at discharge.  Agree with  wife transporting him to the hospital. Final Clinical Impressions(s) / UC Diagnoses   Final diagnoses:  Discomfort of right ear  Sore throat  Shortness of breath  Bradycardia  History of atrial fibrillation     Discharge Instructions      Please go to the emergency department as soon as you leave urgent care for further evaluation and management.    ED Prescriptions   None    PDMP not reviewed this encounter.   Teodora Medici, [place] 09/19/22  0933  

## 2022-09-19 NOTE — Discharge Instructions (Addendum)
Please go to the emergency department as soon as you leave urgent care for further evaluation and management. ?

## 2022-09-22 ENCOUNTER — Ambulatory Visit
Admission: EM | Admit: 2022-09-22 | Discharge: 2022-09-22 | Disposition: A | Discharge status: Home / Self Care | Payer: Managed Care, Other (non HMO) | Attending: Emergency Medicine | Admitting: Emergency Medicine

## 2022-09-22 DIAGNOSIS — H6991 Unspecified Eustachian tube disorder, right ear: Secondary | ICD-10-CM | POA: Diagnosis not present

## 2022-09-22 MED ORDER — IPRATROPIUM BROMIDE 0.06 % NA SOLN: 2.0000 | Freq: Four times a day (QID) | NASAL | 12 refills | Status: DC

## 2022-09-22 MED ORDER — FLUTICASONE PROPIONATE 50 MCG/ACT NA SUSP: 2.0000 | Freq: Every day | NASAL | 1 refills | Status: DC

## 2022-09-22 NOTE — ED Triage Notes (Signed)
Pt c/o RT ear ache onset x1 week ago

## 2022-09-22 NOTE — ED Provider Notes (Signed)
[facility]    CSN: [id number] Arrival date & time: 09/22/22  1327      History   Chief Complaint Chief Complaint  Patient presents with   Otalgia    RT ear    HPI Nicholas Reynolds is a 71 y.o. male.   HPI  71 year old male here for evaluation of right ear pain.  Patient reports that he has been experiencing pain in his right ear for for the last week.  He also has pain with swallowing on the right side also pain with chewing but only on the right side.  This is not associate with any fever, runny nose, nasal congestion, or drainage from his ear.  He does have a history of being hard of hearing and wears hearing aids.  Past Medical History:  Diagnosis Date   Arthritis    thumbs   Asymptomatic varicose veins of bilateral lower extremities    Uncomfortable but in no ulcers   Cancer (HCC)    Complication of anesthesia    slow to wake after hernia surg.  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   Bilateral hearing loss 09/19/2022   Cataract 09/19/2022   Dry eye syndrome of bilateral lacrimal glands 09/19/2022   Encounter for fitting and adjustment of hearing aid 09/19/2022   History of cholecystectomy 09/19/2022   History of total knee arthroplasty 09/19/2022   Left ventricular hypertrophy 09/19/2022   Personal history of non-Hodgkin lymphomas 09/19/2022   Personal history of other malignant neoplasms of lymphoid, hematopoietic and related tissues 09/19/2022   Presence of artificial knee joint, bilateral 09/19/2022   Radiculopathy, lumbar region 09/19/2022   Screening due 09/19/2022   Presence of intraocular lens 06/06/2019   Age-related nuclear cataract of both eyes 05/31/2019   Disorder of refraction 05/31/2019   Dry eye syndrome of unspecified lacrimal gland 05/31/2019   Cortical senile cataract of left eye 05/16/2019   Hypotension 06/16/2017   Rectal prolapse 06/16/2017   Abdominal pain 06/16/2017   Arthritis of knee 04/15/2017   Goals of care, counseling/discussion 03/15/2017   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   Chronic back pain 03/09/2017   Gastroesophageal reflux 03/09/2017   Morbid obesity ([place]) 03/09/2017   Peripheral vascular disease ([place])  03/09/2017   Vasomotor rhinitis 03/09/2017   Ventricular tachycardia ([place]) 03/09/2017   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   Atrial tachycardia 09/09/2016   Reynolds syncope 09/09/2016   Osteoarthritis of knee 08/11/2016   Sleep apnea    Family history of premature coronary artery disease    Pigmented villonodular synovitis of left knee 07/09/2015   Strain of left quadriceps tendon 04/18/2015   Traumatic arthritis of knee, left 04/18/2015   Pain in right knee 03/07/2015   Obesity (BMI 30-39.9) 12/05/2014   Orthostatic dizziness  12/05/2014   Trigger finger of left thumb 09/06/2014   Atrial fibrillation ([place]) 07/15/2014   Varicose veins of lower extremity with edema 07/15/2014   Fatigue 07/15/2014   Hearing loss, sensorineural 04/26/2014   Palpitations 04/06/2014   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   Benign non-nodular prostatic hyperplasia with lower urinary tract symptoms 03/10/2013   Venous insufficiency 11/17/2012   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   H/O ventricular tachycardia 05/24/2007   Coronary artery disease, non-occlusive 05/24/2007    Past Surgical History:  Procedure Laterality Date   CARDIAC CATHETERIZATION  2008   Nonobstructive CAD   CHOLECYSTECTOMY     ELBOW SURGERY     Event monitor  May 2015   Unrevealing   HERNIA REPAIR     KNEE ARTHROSCOPY Left 06/28/2015   Procedure: ARTHROSCOPY KNEE WITH SYNOVECTOMY;  Surgeon: Leanor Kail, MD;  Location: [facility];  Service: Orthopedics;  Laterality: Left;  CPAP   LAPAROSCOPIC GASTRIC SLEEVE RESECTION     December 20 14th   NM [facility]rand  June 28, 2014   [facility]: [facility] -- no evidence of ischemia or infarction. Normal EF ~58%   PORTA CATH INSERTION N/A 03/15/2017   Procedure: Glori Luis Cath Insertion;  Surgeon: Algernon Huxley, MD;  Location: [facility];  Service: Cardiovascular;  Laterality: N/A;   RADIOFREQUENCY ABLATION of Ventricular Tachycardia  July 2000 the   RVOT VT ablation: [facility], [place], [place] ECHOCARDIOGRAM  May 2015   [facility] - Dr. Sharyn Lull: EF 55%, mild LVH; otherwise mostly normal       Home Medications    Prior to Admission medications   Medication Sig Start Date End Date Taking? Authorizing Provider  fluticasone (FLONASE) 50 MCG/ACT nasal spray Place 2 sprays into both nostrils daily. 09/22/22  Yes Margarette Canada, NP  ipratropium (ATROVENT) 0.06 % nasal spray Place 2 sprays into both nostrils 4 (four) times daily. 09/22/22  Yes Margarette Canada, NP  allopurinol (ZYLOPRIM) 300 MG tablet Take 1 tablet (300 mg total) by mouth daily. Patient not taking: Reported on 08/30/2017 03/17/17   Lloyd Huger, MD  amoxicillin (AMOXIL) 500 MG tablet TAKE 1 TABLET 3 TIMES A DAY UNTIL FINISHED    [provider]  apixaban (ELIQUIS) 5 MG TABS tablet TAKE ONE TABLET BY MOUTH EVERY 12 HOURS CAUTION BLOOD THINNER 06/08/22   [provider]  colchicine 0.6 MG tablet Take 1 tablet (0.6 mg total) by mouth 2 (two) times daily for 7 days. 05/31/22 06/07/22  Arta Silence, MD  cyclobenzaprine (FLEXERIL) 5 MG tablet Take 1 tablet (5 mg total) by mouth 3 (three) times daily as needed for muscle spasms. Patient not taking: Reported on 08/30/2017 05/27/17   Lloyd Huger, MD  dabigatran (PRADAXA) 150 MG CAPS capsule Take by mouth.    [provider]  Docusate Sodium (DSS) 100 MG CAPS Take 1 capsule every  day by oral route.    [provider]  dorzolamide-timolol (COSOPT) 2-0.5 % ophthalmic solution Apply to eye. 06/06/19   [provider]  enoxaparin (LOVENOX) 150 MG/ML injection     [provider]  fentaNYL (DURAGESIC - DOSED MCG/HR) 25 MCG/HR patch Place 1 patch (25 mcg total) onto the skin every 3 (three) days. Patient not taking: Reported on 08/30/2017 07/19/17   Lloyd Huger, MD  finasteride (PROSCAR) 5 MG tablet TAKE ONE TABLET BY MOUTH AT BEDTIME FOR ENLARGED PROSTATE 05/15/22   [provider]  flecainide (TAMBOCOR) 50 MG tablet     [provider]  gabapentin (NEURONTIN) 300 MG capsule     [provider]  HYDROcodone-acetaminophen (NORCO) 7.5-325 MG tablet Take 1 tablet by mouth every 8 (eight) hours as needed.    [provider]  hydrocortisone (ANUSOL-HC) 2.5 % rectal cream Apply topically.    [provider]  hydrocortisone (ANUSOL-HC) 25 MG suppository Place 1 suppository (25 mg total) rectally 2 (two) times daily. Patient not taking:  Reported on 08/30/2017 06/17/17   Gladstone Lighter, MD  Ibuprofen 200 MG CAPS Take 4 capsules every 6 hours by oral route as needed.    [provider]  levofloxacin (LEVAQUIN) 500 MG tablet     [provider]  Lifitegrast 5 % SOLN INSTILL 1 DROP IN [facility] EYE TWO TIMES A DAY FOR DRY EYE 05/01/22   [provider]  Rolan Lipa 290 MCG CAPS capsule  07/23/17   [provider]  Liraglutide -Weight Management (SAXENDA) 18 MG/3ML SOPN INJECT '3MG'$  UNDER SKIN EVERY DAY FOR WEIGHT LOSS 02/11/22   [provider]  Loratadine 10 MG CAPS Take 1 capsule every day by oral route.    [provider]  meloxicam (MOBIC) 15 MG tablet Take 1 tablet every day by oral route with food.    [provider]  metoCLOPramide (REGLAN) 10 MG tablet TAKE 1 TABLET(S) EVERY 6 HOURS BY ORAL ROUTE AS NEEDED FOR NAUSEA.    [provider]  metoprolol succinate (TOPROL-XL) 50 MG 24 hr tablet Take 50 mg by mouth daily. Take with or immediately following a meal.    [provider]  metoprolol tartrate (LOPRESSOR) 50 MG tablet Take 1 tablet twice a day by oral route. 03/11/17   [provider]  moxifloxacin (VIGAMOX) 0.5 % ophthalmic solution 1 gtt QID in op eye 3 days prior to operation and 1 gtt morning of operation OD July 1, OS July 13 05/16/19   [provider]  Multiple Vitamin (MULTI-VITAMIN) tablet Take 1 tablet by mouth daily.    [provider]  omeprazole (PRILOSEC) 40 MG capsule  07/20/17   [provider]  ondansetron (ZOFRAN) 8 MG tablet Take 1 tablet (8 mg total) by mouth 2 (two) times daily as needed for refractory nausea / vomiting. Start on day 3 after cyclophosphamide chemotherapy. Patient not taking: Reported on 08/30/2017 04/29/17   Lloyd Huger, MD  ondansetron (ZOFRAN) 8 MG tablet Take by mouth.    [provider]  Oxycodone HCl 10 MG TABS Take 1 tablet (10 mg total) by mouth every 6 (six) hours as  needed. Patient not taking: Reported on 08/30/2017 06/07/17   Lloyd Huger, MD  pregabalin (LYRICA) 50 MG capsule Take 1 capsule by mouth 2 (two) times daily. 09/17/22   [provider]  prochlorperazine (COMPAZINE) 10 MG tablet Take by mouth.    [provider]  REFRESH LIQUIGEL  1 % GEL Apply to eye. 06/14/22   [provider]  senna (SENOKOT) 8.6 MG tablet Take by mouth.    [provider]  sucralfate (CARAFATE) 1 g tablet     [provider]  sulfamethoxazole-trimethoprim (BACTRIM DS) 800-160 MG tablet     [provider]  tamsulosin (FLOMAX) 0.4 MG CAPS capsule Take 0.4 mg by mouth daily. PM    [provider]  traMADol (ULTRAM) 50 MG tablet TAKE 1 TABLET BY MOUTH TWO TIMES A DAY AS NEEDED FOR CHRONIC PAIN 09/17/22   [provider]  warfarin (COUMADIN) 5 MG tablet Take 5 mg by mouth daily. Mon and Fri 7.5 mg, all other days 5 mg    [provider]    Family History Family History  Problem Relation Age of Onset   Breast cancer Mother    Bone cancer Mother    COPD Father    Arrhythmia Father    Heart failure Father    Heart attack Father     Social History Social History   Tobacco Use   Smoking status: Former    Types: Cigarettes    Quit date: 11/24/1999    Years since quitting: 22.8   Smokeless tobacco: Never  Vaping Use   Vaping Use: Never used  Substance Use Topics   Alcohol use: No   Drug use: No     Allergies   Patient has no known allergies.   Review of Systems Review of Systems  Constitutional:  Negative for fever.  HENT:  Positive for ear pain and rhinorrhea. Negative for congestion and ear discharge.      Physical Exam Triage Vital Signs ED Triage Vitals  Enc Vitals Group     BP 09/22/22 1512 136/83     Pulse Rate 09/22/22 1512 63     Resp --      Temp 09/22/22 1512 97.7 F (36.5 C)     Temp Source 09/22/22 1512 Oral     SpO2 09/22/22 1512 97 %     Weight  09/22/22 1511 300 lb (136.1 kg)     Height 09/22/22 1511 '6\' 5"'$  (1.956 m)     Head Circumference --      Peak Flow --      Pain Score 09/22/22 1511 6     Pain Loc --      Pain Edu? --      Excl. in [place]? --    No data found.  Updated Vital Signs BP 136/83 (BP Location: Right Arm)   Pulse 63   Temp 97.7 F (36.5 C) (Oral)   Ht '6\' 5"'$  (1.956 m)   Wt 300 lb (136.1 kg)   SpO2 97%   BMI 35.57 kg/m   Visual Acuity Right Eye Distance:   Left Eye Distance:   Bilateral Distance:    Right Eye Reynolds:   Left Eye Reynolds:    Bilateral Reynolds:     Physical Exam Vitals and nursing note reviewed.  Constitutional:      Appearance: Normal appearance. He is not ill-appearing.  HENT:     Head: Normocephalic and atraumatic.     Right Ear: Tympanic membrane, ear canal and external ear normal. There is no impacted cerumen.     Mouth/Throat:     Mouth: Mucous membranes are moist.     Pharynx: Oropharynx is clear. No oropharyngeal exudate or posterior oropharyngeal erythema.  Skin:    General: Skin is warm and dry.  Capillary Refill: Capillary refill takes less than 2 seconds.  Neurological:     General: No focal deficit present.     Mental Status: He is alert and oriented to person, place, and time.  Psychiatric:        Mood and Affect: Mood normal.        Behavior: Behavior normal.        Thought Content: Thought content normal.        Judgment: Judgment normal.      UC Treatments / Results  Labs (all labs ordered are listed, but only abnormal results are displayed) Labs Reviewed - No data to display  EKG   Radiology No results found.  Procedures Procedures (including critical care time)  Medications Ordered in UC Medications - No data to display  Initial Impression / Assessment and Plan / UC Course  I have reviewed the triage vital signs and the nursing notes.  Pertinent labs & imaging results that were available during my care of the patient were reviewed by me and  considered in my medical decision making (see chart for details).   Patient is a nontoxic-appearing 71 year old male here for evaluation of muffled hearing in the right ear along with aching in the ear and aching behind the angle of the jaw with chewing and swallowing.  This is on the right-hand side.  The patient does endorse some runny nose but denies any nasal congestion.  He also denies any fever or drainage from the ear.  He states that he travels a lot.  He also wears hearing aids because he is hard of hearing.  On exam the patient's external auditory canal is clear and the tympanic membrane is pearly gray appearance with normal light reflex.  He does have tenderness when palpating the eustachian tube externally on the right.  No cervical of adenopathy appreciable exam.  Patient exam is consistent with eustachian tube dysfunction.  I will treat this with over-the-counter antihistamines, Flonase, and after nasal spray.  No pulse encouraged the patient to try equalizes his ears throughout the day to clear the blockage and allow his ear to drain.  If symptoms not improve he should return for reevaluation.   Final Clinical Impressions(s) / UC Diagnoses   Final diagnoses:  Eustachian tube dysfunction, right     Discharge Instructions      Use the Atrovent nasal spray, 2 squirts in each nostril every 6 hours, to help with nasal congestion.  Take over-the-counter Zyrtec, Claritin, or Allegra once daily to help with allergic symptoms.  Instill 2 squirts of fluticasone in each nostril at bedtime nightly.  And the nasal away from the septum of your nose and follow each set of squirts with 1 squirt of nasal saline to push the particles up into your turbinates where they will take effect.  Continue to equalize your ears as shown to help clear mucus from eustachian tubes and maintain patency.       ED Prescriptions     Medication Sig Dispense Auth. Provider   fluticasone (FLONASE) 50 MCG/ACT  nasal spray Place 2 sprays into both nostrils daily. 18.2 mL Margarette Canada, NP   ipratropium (ATROVENT) 0.06 % nasal spray Place 2 sprays into both nostrils 4 (four) times daily. 15 mL Margarette Canada, NP      PDMP not reviewed this encounter.   Margarette Canada, NP 09/22/22 1538

## 2022-09-22 NOTE — Discharge Instructions (Signed)
Use the Atrovent nasal spray, 2 squirts in each nostril every 6 hours, to help with nasal congestion.  Take over-the-counter Zyrtec, Claritin, or Allegra once daily to help with allergic symptoms.  Instill 2 squirts of fluticasone in each nostril at bedtime nightly.  And the nasal away from the septum of your nose and follow each set of squirts with 1 squirt of nasal saline to push the particles up into your turbinates where they will take effect.  Continue to equalize your ears as shown to help clear mucus from eustachian tubes and maintain patency.

## 2023-01-01 NOTE — Progress Notes (Unsigned)
Patient: Nicholas Reynolds  Service Category: E/M  Provider: Gaspar Cola, MD  DOB: 1951/04/09  DOS: 01/04/2023  Referring Provider: [facility]  MRN: [id number]  Setting: Ambulatory outpatient  PCP: [facility]  Type: New Patient  Specialty: Interventional Pain Management    Location: Office  Delivery: Face-to-face     Primary Reason(s) for Visit: Encounter for initial evaluation of one or more chronic problems (new to examiner) potentially causing chronic pain, and posing a threat to normal musculoskeletal function. (Level of risk: High) CC: No chief complaint on file.  HPI  Mr. Buckert is a 72 y.o. year old, male patient, who comes for the first time to our practice referred by [facility], [facility] for our initial evaluation of his chronic pain. He has Atrial fibrillation ([place]); Varicose veins of lower extremity with edema; Fatigue; Obesity (BMI 30-39.9); Orthostatic dizziness; H/O ventricular tachycardia; Coronary artery disease, non-occlusive; Sleep apnea; Family history of premature coronary artery disease; Retroperitoneal mass; Pain in right knee; Benign non-nodular prostatic hyperplasia with lower urinary tract symptoms; Chronic back pain; Osteoarthritis of lumbar spine; Gastroesophageal reflux; Hearing loss, sensorineural; Hypogonadism male; Morbid obesity ([place]); Palpitations; Peripheral vascular disease ([place]); Pigmented villonodular synovitis of left knee; Strain of left quadriceps tendon; Traumatic arthritis of knee, left; Trigger finger of left thumb; Vasomotor rhinitis; Venous insufficiency; Ventricular tachycardia ([place]); Goals of care, counseling/discussion; 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[place]); Arthritis of knee; Atrial tachycardia; Near syncope; Osteoarthritis of knee; Hypotension; Rectal prolapse; Abdominal pain; Age-related nuclear cataract of both eyes; Ascending aorta dilatation ([place]); Bilateral hearing loss;  Cataract; Cortical senile cataract of left eye; Disorder of refraction; Dry eye syndrome of bilateral lacrimal glands; Dry eye syndrome of unspecified lacrimal gland; Encounter for fitting and adjustment of hearing aid; History of cholecystectomy; History of total knee arthroplasty; Left ventricular hypertrophy; Personal history of non-Hodgkin lymphomas; Personal history of other malignant neoplasms of lymphoid, hematopoietic and related tissues; Presence of artificial knee joint, bilateral; Presence of intraocular lens; Radiculopathy, lumbar region; and Screening due on their problem list. Today he comes in for evaluation of his No chief complaint on file.  Pain Assessment: Location:     Radiating:   Onset:   Duration:   Quality:   Severity:  /10 (subjective, self-reported pain score)  Effect on ADL:   Timing:   Modifying factors:   BP:    HR:    Onset and Duration: {Hx; Onset and Duration:210120511} Cause of pain: {Hx; Cause:210120521} Severity: {Pain Severity:210120502} Timing: {Symptoms; Timing:210120501} Aggravating Factors: {Causes; Aggravating pain factors:210120507} Alleviating Factors: {Causes; Alleviating Factors:210120500} Associated Problems: {Hx; Associated problems:210120515} Quality of Pain: {Hx; Symptom quality or Descriptor:210120531} Previous Examinations or Tests: {Hx; Previous examinations or test:210120529} Previous Treatments: {Hx; Previous Treatment:210120503}  Mr. Riviello is being evaluated for possible interventional pain management therapies for the treatment of his chronic pain.   ***  Mr. Cottam has been informed that this initial visit was an evaluation only.  On the follow up appointment I will go over the results, including ordered tests and available interventional therapies. At that time he will have the opportunity to decide whether to proceed with offered therapies or not. In the event that Mr. Do prefers avoiding interventional options,  this will conclude our involvement in the case.  Medication management recommendations may be provided upon request.  Historic Controlled Substance Pharmacotherapy Review  PMP and historical list of controlled substances: ***  Most recently prescribed opioid analgesics:   *** MME/day: *** mg/day  Historical  Monitoring: The patient  reports no history of drug use. List of prior UDS Testing: Lab Results  Component Value Date   MDMA NEGATIVE 06/26/2014   COCAINSCRNUR NEGATIVE 06/26/2014   PCPSCRNUR NEGATIVE 06/26/2014   THCU NEGATIVE 06/26/2014   Historical Background Evaluation: [place] PMP: PDMP reviewed during this encounter. Review of the past 67-month conducted.             PMP NARX Score Report:  Narcotic: *** Sedative: *** Stimulant: ***  Department of public safety, offender search: (Editor, commissioningInformation) Non-contributory Risk Assessment Profile: Aberrant behavior: None observed or detected today Risk factors for fatal opioid overdose: None identified today PMP NARX Overdose Risk Score: *** Fatal overdose hazard ratio (HR): Calculation deferred Non-fatal overdose hazard ratio (HR): Calculation deferred Risk of opioid abuse or dependence: 0.7-3.0% with doses ? 36 MME/day and 6.1-26% with doses ? 120 MME/day. Substance use disorder (SUD) risk level: See below Personal History of Substance Abuse (SUD-Substance use disorder):  Alcohol:    Illegal Drugs:    Rx Drugs:    ORT Risk Level calculation:    ORT Scoring interpretation table:  Score <3 = Low Risk for SUD  Score between 4-7 = Moderate Risk for SUD  Score >8 = High Risk for Opioid Abuse   PHQ-2 Depression Scale:  Total score:    PHQ-2 Scoring interpretation table: (Score and probability of major depressive disorder)  Score 0 = No depression  Score 1 = 15.4% Probability  Score 2 = 21.1% Probability  Score 3 = 38.4% Probability  Score 4 = 45.5% Probability  Score 5 = 56.4% Probability  Score 6 = 78.6% Probability    PHQ-9 Depression Scale:  Total score:    PHQ-9 Scoring interpretation table:  Score 0-4 = No depression  Score 5-9 = Mild depression  Score 10-14 = Moderate depression  Score 15-19 = Moderately severe depression  Score 20-27 = Severe depression (2.4 times higher risk of SUD and 2.89 times higher risk of overuse)   Pharmacologic Plan: As per protocol, I have not taken over any controlled substance management, pending the results of ordered tests and/or consults.            Initial impression: Pending review of available data and ordered tests.  Meds   Current Outpatient Medications:    allopurinol (ZYLOPRIM) 300 MG tablet, Take 1 tablet (300 mg total) by mouth daily. (Patient not taking: Reported on 08/30/2017), Disp: 30 tablet, Rfl: 3   amoxicillin (AMOXIL) 500 MG tablet, TAKE 1 TABLET 3 TIMES A DAY UNTIL FINISHED, Disp: , Rfl:    apixaban (ELIQUIS) 5 MG TABS tablet, TAKE ONE TABLET BY MOUTH EVERY 12 HOURS CAUTION BLOOD THINNER, Disp: , Rfl:    colchicine 0.6 MG tablet, Take 1 tablet (0.6 mg total) by mouth 2 (two) times daily for 7 days., Disp: 14 tablet, Rfl: 0   cyclobenzaprine (FLEXERIL) 5 MG tablet, Take 1 tablet (5 mg total) by mouth 3 (three) times daily as needed for muscle spasms. (Patient not taking: Reported on 08/30/2017), Disp: 60 tablet, Rfl: 0   dabigatran (PRADAXA) 150 MG CAPS capsule, Take by mouth., Disp: , Rfl:    Docusate Sodium (DSS) 100 MG CAPS, Take 1 capsule every day by oral route., Disp: , Rfl:    dorzolamide-timolol (COSOPT) 2-0.5 % ophthalmic solution, Apply to eye., Disp: , Rfl:    enoxaparin (LOVENOX) 150 MG/ML injection, , Disp: , Rfl:    fentaNYL (DURAGESIC - DOSED MCG/HR) 25 MCG/HR patch, Place 1  patch (25 mcg total) onto the skin every 3 (three) days. (Patient not taking: Reported on 08/30/2017), Disp: 10 patch, Rfl: 0   finasteride (PROSCAR) 5 MG tablet, TAKE ONE TABLET BY MOUTH AT BEDTIME FOR ENLARGED PROSTATE, Disp: , Rfl:    flecainide (TAMBOCOR) 50 MG  tablet, , Disp: , Rfl:    fluticasone (FLONASE) 50 MCG/ACT nasal spray, Place 2 sprays into both nostrils daily., Disp: 18.2 mL, Rfl: 1   gabapentin (NEURONTIN) 300 MG capsule, , Disp: , Rfl:    HYDROcodone-acetaminophen (NORCO) 7.5-325 MG tablet, Take 1 tablet by mouth every 8 (eight) hours as needed., Disp: , Rfl:    hydrocortisone (ANUSOL-HC) 2.5 % rectal cream, Apply topically., Disp: , Rfl:    hydrocortisone (ANUSOL-HC) 25 MG suppository, Place 1 suppository (25 mg total) rectally 2 (two) times daily. (Patient not taking: Reported on 08/30/2017), Disp: 12 suppository, Rfl: 1   Ibuprofen 200 MG CAPS, Take 4 capsules every 6 hours by oral route as needed., Disp: , Rfl:    ipratropium (ATROVENT) 0.06 % nasal spray, Place 2 sprays into both nostrils 4 (four) times daily., Disp: 15 mL, Rfl: 12   levofloxacin (LEVAQUIN) 500 MG tablet, , Disp: , Rfl:    Lifitegrast 5 % SOLN, INSTILL 1 DROP IN EACH EYE TWO TIMES A DAY FOR DRY EYE, Disp: , Rfl:    LINZESS 290 MCG CAPS capsule, , Disp: , Rfl: 11   Liraglutide -Weight Management (SAXENDA) 18 MG/3ML SOPN, INJECT 3MG UNDER SKIN EVERY DAY FOR WEIGHT LOSS, Disp: , Rfl:    Loratadine 10 MG CAPS, Take 1 capsule every day by oral route., Disp: , Rfl:    meloxicam (MOBIC) 15 MG tablet, Take 1 tablet every day by oral route with food., Disp: , Rfl:    metoCLOPramide (REGLAN) 10 MG tablet, TAKE 1 TABLET(S) EVERY 6 HOURS BY ORAL ROUTE AS NEEDED FOR NAUSEA., Disp: , Rfl:    metoprolol succinate (TOPROL-XL) 50 MG 24 hr tablet, Take 50 mg by mouth daily. Take with or immediately following a meal., Disp: , Rfl:    metoprolol tartrate (LOPRESSOR) 50 MG tablet, Take 1 tablet twice a day by oral route., Disp: , Rfl:    moxifloxacin (VIGAMOX) 0.5 % ophthalmic solution, 1 gtt QID in op eye 3 days prior to operation and 1 gtt morning of operation OD July 1, OS July 13, Disp: , Rfl:    Multiple Vitamin (MULTI-VITAMIN) tablet, Take 1 tablet by mouth daily., Disp: , Rfl:     omeprazole (PRILOSEC) 40 MG capsule, , Disp: , Rfl:    ondansetron (ZOFRAN) 8 MG tablet, Take 1 tablet (8 mg total) by mouth 2 (two) times daily as needed for refractory nausea / vomiting. Start on day 3 after cyclophosphamide chemotherapy. (Patient not taking: Reported on 08/30/2017), Disp: 60 tablet, Rfl: 2   ondansetron (ZOFRAN) 8 MG tablet, Take by mouth., Disp: , Rfl:    Oxycodone HCl 10 MG TABS, Take 1 tablet (10 mg total) by mouth every 6 (six) hours as needed. (Patient not taking: Reported on 08/30/2017), Disp: 90 tablet, Rfl: 0   pregabalin (LYRICA) 50 MG capsule, Take 1 capsule by mouth 2 (two) times daily., Disp: , Rfl:    prochlorperazine (COMPAZINE) 10 MG tablet, Take by mouth., Disp: , Rfl:    REFRESH LIQUIGEL 1 % GEL, Apply to eye., Disp: , Rfl:    senna (SENOKOT) 8.6 MG tablet, Take by mouth., Disp: , Rfl:    sucralfate (CARAFATE) 1 g tablet, ,  Disp: , Rfl:    sulfamethoxazole-trimethoprim (BACTRIM DS) 800-160 MG tablet, , Disp: , Rfl:    tamsulosin (FLOMAX) 0.4 MG CAPS capsule, Take 0.4 mg by mouth daily. PM, Disp: , Rfl:    traMADol (ULTRAM) 50 MG tablet, TAKE 1 TABLET BY MOUTH TWO TIMES A DAY AS NEEDED FOR CHRONIC PAIN, Disp: , Rfl:    warfarin (COUMADIN) 5 MG tablet, Take 5 mg by mouth daily. Mon and Fri 7.5 mg, all other days 5 mg, Disp: , Rfl:  No current facility-administered medications for this visit.  Facility-Administered Medications Ordered in Other Visits:    sodium chloride flush (NS) 0.9 % injection 10 mL, 10 mL, Intravenous, PRN, Lloyd Huger, MD, 10 mL at 07/19/17 1050  Imaging Review  Cervical Imaging: Cervical MR wo contrast: No results found for this or any previous visit.  Cervical MR wo contrast: No valid procedures specified. Cervical MR w/wo contrast: No results found for this or any previous visit.  Cervical MR w contrast: No results found for this or any previous visit.  Cervical CT wo contrast: No results found for this or any previous  visit.  Cervical CT w/wo contrast: No results found for this or any previous visit.  Cervical CT w/wo contrast: No results found for this or any previous visit.  Cervical CT w contrast: No results found for this or any previous visit.  Cervical CT outside: No results found for this or any previous visit.  Cervical DG 1 view: No results found for this or any previous visit.  Cervical DG 2-3 views: No results found for this or any previous visit.  Cervical DG F/E views: No results found for this or any previous visit.  Cervical DG 2-3 clearing views: No results found for this or any previous visit.  Cervical DG Bending/F/E views: No results found for this or any previous visit.  Cervical DG complete: No results found for this or any previous visit.  Cervical DG Myelogram views: No results found for this or any previous visit.  Cervical DG Myelogram views: No results found for this or any previous visit.  Cervical Discogram views: No results found for this or any previous visit.   Shoulder Imaging: Shoulder-R MR w contrast: No results found for this or any previous visit.  Shoulder-L MR w contrast: No results found for this or any previous visit.  Shoulder-R MR w/wo contrast: No results found for this or any previous visit.  Shoulder-L MR w/wo contrast: No results found for this or any previous visit.  Shoulder-R MR wo contrast: No results found for this or any previous visit.  Shoulder-L MR wo contrast: No results found for this or any previous visit.  Shoulder-R CT w contrast: No results found for this or any previous visit.  Shoulder-L CT w contrast: No results found for this or any previous visit.  Shoulder-R CT w/wo contrast: No results found for this or any previous visit.  Shoulder-L CT w/wo contrast: No results found for this or any previous visit.  Shoulder-R CT wo contrast: No results found for this or any previous visit.  Shoulder-L CT wo contrast: No  results found for this or any previous visit.  Shoulder-R DG Arthrogram: No results found for this or any previous visit.  Shoulder-L DG Arthrogram: No results found for this or any previous visit.  Shoulder-R DG 1 view: No results found for this or any previous visit.  Shoulder-L DG 1 view: No results found for this or  any previous visit.  Shoulder-R DG: No results found for this or any previous visit.  Shoulder-L DG: No results found for this or any previous visit.   Thoracic Imaging: Thoracic MR wo contrast: No results found for this or any previous visit.  Thoracic MR wo contrast: No valid procedures specified. Thoracic MR w/wo contrast: No results found for this or any previous visit.  Thoracic MR w contrast: No results found for this or any previous visit.  Thoracic CT wo contrast: No results found for this or any previous visit.  Thoracic CT w/wo contrast: No results found for this or any previous visit.  Thoracic CT w/wo contrast: No results found for this or any previous visit.  Thoracic CT w contrast: No results found for this or any previous visit.  Thoracic DG 2-3 views: No results found for this or any previous visit.  Thoracic DG 4 views: No results found for this or any previous visit.  Thoracic DG: No results found for this or any previous visit.  Thoracic DG w/swimmers view: No results found for this or any previous visit.  Thoracic DG Myelogram views: No results found for this or any previous visit.  Thoracic DG Myelogram views: No results found for this or any previous visit.   Lumbosacral Imaging: Lumbar MR wo contrast: No results found for this or any previous visit.  Lumbar MR wo contrast: No valid procedures specified. Lumbar MR w/wo contrast: No results found for this or any previous visit.  Lumbar MR w/wo contrast: No results found for this or any previous visit.  Lumbar MR w contrast: No results found for this or any previous  visit.  Lumbar CT wo contrast: No results found for this or any previous visit.  Lumbar CT w/wo contrast: No results found for this or any previous visit.  Lumbar CT w/wo contrast: No results found for this or any previous visit.  Lumbar CT w contrast: No results found for this or any previous visit.  Lumbar DG 1V: No results found for this or any previous visit.  Lumbar DG 1V (Clearing): No results found for this or any previous visit.  Lumbar DG 2-3V (Clearing): No results found for this or any previous visit.  Lumbar DG 2-3 views: No results found for this or any previous visit.  Lumbar DG (Complete) 4+V: No results found for this or any previous visit.        Lumbar DG F/E views: No results found for this or any previous visit.        Lumbar DG Bending views: No results found for this or any previous visit.        Lumbar DG Myelogram views: No results found for this or any previous visit.  Lumbar DG Myelogram: No results found for this or any previous visit.  Lumbar DG Myelogram: No results found for this or any previous visit.  Lumbar DG Myelogram: No results found for this or any previous visit.  Lumbar DG Myelogram Lumbosacral: No results found for this or any previous visit.  Lumbar DG Diskogram views: No results found for this or any previous visit.  Lumbar DG Diskogram views: No results found for this or any previous visit.  Lumbar DG Epidurogram OP: No results found for this or any previous visit.  Lumbar DG Epidurogram IP: No valid procedures specified.  Sacroiliac Joint Imaging: Sacroiliac Joint DG: No results found for this or any previous visit.  Sacroiliac Joint MR w/wo contrast: No results found  for this or any previous visit.  Sacroiliac Joint MR wo contrast: No results found for this or any previous visit.   Spine Imaging: Whole Spine DG Myelogram views: No results found for this or any previous visit.  Whole Spine MR Mets screen: No results  found for this or any previous visit.  Whole Spine MR Mets screen: No results found for this or any previous visit.  Whole Spine MR w/wo: No results found for this or any previous visit.  MRA Spinal Canal w/ cm: No results found for this or any previous visit.  MRA Spinal Canal wo/ cm: No valid procedures specified. MRA Spinal Canal w/wo cm: No results found for this or any previous visit.  Spine Outside MR Films: No results found for this or any previous visit.  Spine Outside CT Films: No results found for this or any previous visit.  CT-Guided Biopsy: Results for orders placed during the hospital encounter of 03/17/17  CT Biopsy  Narrative INDICATION: Diffuse large B-cell lymphoma  EXAM: CT GUIDED RIGHT ILIAC BONE MARROW ASPIRATION AND CORE BIOPSY  Date:  4/25/20184/25/2018 9:35 am  Radiologist:  M. Daryll Brod, MD  Guidance:  CT  FLUOROSCOPY TIME:  Fluoroscopy Time: NONE.  MEDICATIONS: None.  ANESTHESIA/SEDATION: 3.0 mg IV Versed; 100 mcg IV Fentanyl  Moderate Sedation Time:  15 minutes  The patient was continuously monitored during the procedure by the interventional radiology nurse under my direct supervision.  CONTRAST:  None.  COMPLICATIONS: None  PROCEDURE: Informed consent was obtained from the patient following explanation of the procedure, risks, benefits and alternatives. The patient understands, agrees and consents for the procedure. All questions were addressed. A time out was performed.  The patient was positioned prone and non-contrast localization CT was performed of the pelvis to demonstrate the iliac marrow spaces.  Maximal barrier sterile technique utilized including caps, mask, sterile gowns, sterile gloves, large sterile drape, hand hygiene, and Betadine prep.  Under sterile conditions and local anesthesia, an 11 gauge coaxial bone biopsy needle was advanced into the right iliac marrow space. Needle position was confirmed with CT  imaging. Initially, bone marrow aspiration was performed. Next, the 11 gauge outer cannula was utilized to obtain a right iliac bone marrow core biopsy. Needle was removed. Hemostasis was obtained with compression. The patient tolerated the procedure well. Samples were prepared with the cytotechnologist. No immediate complications.  IMPRESSION: CT guided right iliac bone marrow aspiration and core biopsy.   Electronically Signed By: Jerilynn Mages.  Shick M.D. On: 03/17/2017 09:58  CT-Guided Needle Placement: No results found for this or any previous visit.  DG Spine outside: No results found for this or any previous visit.  IR Spine outside: No results found for this or any previous visit.  NM Spine outside: No results found for this or any previous visit.   Hip Imaging: Hip-R MR w contrast: No results found for this or any previous visit.  Hip-L MR w contrast: No results found for this or any previous visit.  Hip-R MR w/wo contrast: No results found for this or any previous visit.  Hip-L MR w/wo contrast: No results found for this or any previous visit.  Hip-R MR wo contrast: No results found for this or any previous visit.  Hip-L MR wo contrast: No results found for this or any previous visit.  Hip-R CT w contrast: No results found for this or any previous visit.  Hip-L CT w contrast: No results found for this or any previous visit.  Hip-R CT w/wo contrast: No results found for this or any previous visit.  Hip-L CT w/wo contrast: No results found for this or any previous visit.  Hip-R CT wo contrast: No results found for this or any previous visit.  Hip-L CT wo contrast: No results found for this or any previous visit.  Hip-R DG 2-3 views: No results found for this or any previous visit.  Hip-L DG 2-3 views: No results found for this or any previous visit.  Hip-R DG Arthrogram: No results found for this or any previous visit.  Hip-L DG Arthrogram: No results found for  this or any previous visit.  Hip-B DG Bilateral: No results found for this or any previous visit.   Knee Imaging: Knee-R MR w contrast: No results found for this or any previous visit.  Knee-L MR w/o contrast: No results found for this or any previous visit.  Knee-R MR w/wo contrast: No results found for this or any previous visit.  Knee-L MR w/wo contrast: No results found for this or any previous visit.  Knee-R MR wo contrast: No results found for this or any previous visit.  Knee-L MR wo contrast: No results found for this or any previous visit.  Knee-R CT w contrast: No results found for this or any previous visit.  Knee-L CT w contrast: No results found for this or any previous visit.  Knee-R CT w/wo contrast: No results found for this or any previous visit.  Knee-L CT w/wo contrast: No results found for this or any previous visit.  Knee-R CT wo contrast: No results found for this or any previous visit.  Knee-L CT wo contrast: No results found for this or any previous visit.  Knee-R DG 1-2 views: No results found for this or any previous visit.  Knee-L DG 1-2 views: No results found for this or any previous visit.  Knee-R DG 3 views: No results found for this or any previous visit.  Knee-L DG 3 views: No results found for this or any previous visit.  Knee-R DG 4 views: No results found for this or any previous visit.  Knee-L DG 4 views: No results found for this or any previous visit.  Knee-R DG Arthrogram: No results found for this or any previous visit.  Knee-L DG Arthrogram: No results found for this or any previous visit.   Ankle Imaging: Ankle-R DG Complete: No results found for this or any previous visit.  Ankle-L DG Complete: No results found for this or any previous visit.   Foot Imaging: Foot-R DG Complete: No results found for this or any previous visit.  Foot-L DG Complete: No results found for this or any previous visit.   Elbow  Imaging: Elbow-R DG Complete: No results found for this or any previous visit.  Elbow-L DG Complete: No results found for this or any previous visit.   Wrist Imaging: Wrist-R DG Complete: No results found for this or any previous visit.  Wrist-L DG Complete: No results found for this or any previous visit.   Hand Imaging: Hand-R DG Complete: No results found for this or any previous visit.  Hand-L DG Complete: No results found for this or any previous visit.   Complexity Note: Imaging results reviewed.                         ROS  Cardiovascular: {Hx; Cardiovascular History:210120525} Pulmonary or Respiratory: {Hx; Pumonary and/or Respiratory History:210120523} Neurological: {Hx; Neurological:210120504} Psychological-Psychiatric: {Hx;  Psychological-Psychiatric History:210120512} Gastrointestinal: {Hx; Gastrointestinal:210120527} Genitourinary: {Hx; Genitourinary:210120506} Hematological: {Hx; Hematological:210120510} Endocrine: {Hx; Endocrine history:210120509} Rheumatologic: {Hx; Rheumatological:210120530} Musculoskeletal: {Hx; Musculoskeletal:[id number]} Work History: {Hx; Work history:210120514}  Allergies  Mr. Gagon has No Known Allergies.  Laboratory Chemistry Profile   Renal Lab Results  Component Value Date   BUN 29 (H) 05/31/2022   CREATININE 1.05 05/31/2022   GFRAA >60 08/30/2017   GFRNONAA >60 05/31/2022   PROTEINUR NEGATIVE 06/16/2017     Electrolytes Lab Results  Component Value Date   NA 141 05/31/2022   K 4.9 05/31/2022   CL 109 05/31/2022   CALCIUM 9.3 05/31/2022   MG 1.6 (L) 06/28/2014     Hepatic Lab Results  Component Value Date   AST 26 08/30/2017   ALT 23 08/30/2017   ALBUMIN 3.6 08/30/2017   ALKPHOS 95 08/30/2017   LIPASE 24 06/16/2017     ID Lab Results  Component Value Date   HIV Non Reactive 06/16/2017     Bone No results found for: "VD25OH", "VD125OH2TOT", "IA:[id number]", "[id number]", "25OHVITD1", "25OHVITD2",  "25OHVITD3", "TESTOFREE", "TESTOSTERONE"   Endocrine Lab Results  Component Value Date   GLUCOSE 107 (H) 05/31/2022   GLUCOSEU NEGATIVE 06/16/2017   TSH 1.495 03/04/2017     Neuropathy Lab Results  Component Value Date   HIV Non Reactive 06/16/2017     CNS No results found for: "COLORCSF", "APPEARCSF", "RBCCOUNTCSF", "WBCCSF", "POLYSCSF", "LYMPHSCSF", "EOSCSF", "PROTEINCSF", "GLUCCSF", "JCVIRUS", "CSFOLI", "IGGCSF", "LABACHR", "ACETBL"   Inflammation (CRP: Acute  ESR: Chronic) Lab Results  Component Value Date   LATICACIDVEN 1.1 06/17/2017     Rheumatology No results found for: "RF", "ANA", "LABURIC", "URICUR", "LYMEIGGIGMAB", "LYMEABIGMQN", "HLAB27"   Coagulation Lab Results  Component Value Date   INR 2.93 07/19/2017   LABPROT 31.2 (H) 07/19/2017   APTT 40 (H) 03/17/2017   PLT 211 05/31/2022     Cardiovascular Lab Results  Component Value Date   CKTOTAL 294 06/26/2014   CKMB 3.0 06/27/2014   TROPONINI <0.03 06/17/2017   HGB 12.3 (L) 05/31/2022   HCT 38.3 (L) 05/31/2022     Screening Lab Results  Component Value Date   HIV Non Reactive 06/16/2017     Cancer No results found for: "CEA", "CA125", "LABCA2"   Allergens No results found for: "ALMOND", "APPLE", "ASPARAGUS", "AVOCADO", "BANANA", "BARLEY", "BASIL", "BAYLEAF", "GREENBEAN", "LIMABEAN", "WHITEBEAN", "BEEFIGE", "REDBEET", "BLUEBERRY", "BROCCOLI", "CABBAGE", "MELON", "CARROT", "CASEIN", "CASHEWNUT", "CAULIFLOWER", "CELERY"     Note: Lab results reviewed.  PFSH  Drug: Mr. Cumberbatch  reports no history of drug use. Alcohol:  reports no history of alcohol use. Tobacco:  reports that he quit smoking about 23 years ago. He has never used smokeless tobacco. Medical:  has a past medical history of Arthritis, Asymptomatic varicose veins of bilateral lower extremities, Cancer ([place]), Complication of anesthesia, Coronary artery disease, non-occlusive (July 2008), Family history of premature coronary artery  disease, GERD (gastroesophageal reflux disease), H/O ventricular tachycardia (05/2007), History of gastric restrictive surgery (December 2040), Obstructive sleep apnea, Paroxysmal atrial fibrillation ([place]) (2002), Pre-syncope (May 2015), Uses hearing aid, and Wears dentures. Family: family history includes Arrhythmia in his father; Bone cancer in his mother; Breast cancer in his mother; COPD in his father; Heart attack in his father; Heart failure in his father.  Past Surgical History:  Procedure Laterality Date   CARDIAC CATHETERIZATION  2008   Nonobstructive CAD   CHOLECYSTECTOMY     ELBOW SURGERY     Event monitor  May 2015   Unrevealing   HERNIA REPAIR  KNEE ARTHROSCOPY Left 06/28/2015   Procedure: ARTHROSCOPY KNEE WITH SYNOVECTOMY;  Surgeon: Leanor Kail, MD;  Location: [facility];  Service: Orthopedics;  Laterality: Left;  CPAP   LAPAROSCOPIC GASTRIC SLEEVE RESECTION     December 20 14th   [facility] [place]  June 28, 2014   [facility]: [facility] -- no evidence of ischemia or infarction. Normal EF ~58%   PORTA CATH INSERTION N/A 03/15/2017   Procedure: Glori Luis Cath Insertion;  Surgeon: Algernon Huxley, MD;  Location: [facility];  Service: Cardiovascular;  Laterality: N/A;   RADIOFREQUENCY ABLATION of Ventricular Tachycardia  July 2000 the   RVOT VT ablation: [facility], [place], [place] ECHOCARDIOGRAM  May 2015   [facility] - Dr. 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    Pain in right knee 03/07/2015   Benign non-nodular prostatic hyperplasia with lower urinary tract symptoms 03/10/2013   Chronic back pain 03/09/2017   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   Gastroesophageal reflux 03/09/2017   Hearing loss, sensorineural 04/26/2014   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   Morbid obesity ([place]) 03/09/2017   Palpitations 04/06/2014   Peripheral vascular disease ([place]) 03/09/2017   Pigmented villonodular synovitis of left knee 07/09/2015   Strain of left quadriceps tendon 04/18/2015   Traumatic arthritis of knee, left 04/18/2015   Trigger finger of left thumb 09/06/2014   Vasomotor rhinitis 03/09/2017   Venous insufficiency 11/17/2012   Ventricular tachycardia ([place]) 03/09/2017   Goals of care, counseling/discussion 03/15/2017   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   Arthritis of knee 04/15/2017   Atrial tachycardia 09/09/2016   Near syncope 09/09/2016   Osteoarthritis of knee 08/11/2016   Hypotension 06/16/2017   Rectal prolapse 06/16/2017   Abdominal pain 06/16/2017   Age-related nuclear cataract of both eyes 05/31/2019   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   Bilateral hearing loss 09/19/2022   Cataract 09/19/2022   Cortical senile cataract of left eye 05/16/2019   Disorder of refraction 05/31/2019   Dry eye syndrome of bilateral lacrimal glands 09/19/2022   Dry eye syndrome of unspecified lacrimal gland 05/31/2019   Encounter for fitting and adjustment of hearing aid 09/19/2022   History of cholecystectomy 09/19/2022   History of total knee arthroplasty 09/19/2022   Left ventricular hypertrophy 09/19/2022   Personal history of non-Hodgkin lymphomas 09/19/2022   Personal history of other malignant neoplasms of lymphoid, hematopoietic and related tissues 09/19/2022   Presence of artificial knee joint, bilateral 09/19/2022   Presence of intraocular lens 06/06/2019   Radiculopathy, lumbar region  09/19/2022   Screening due 09/19/2022   Resolved Ambulatory Problems    Diagnosis Date Noted   No Resolved Ambulatory Problems   Past Medical History:  Diagnosis Date   Arthritis    Asymptomatic varicose veins of bilateral lower extremities    Cancer ([facility])    Complication of anesthesia    GERD (gastroesophageal reflux disease)    History of gastric restrictive  surgery December 2040   Obstructive sleep apnea    Paroxysmal atrial fibrillation [facility]) 2002   Pre-syncope May 2015   Uses hearing aid    Wears dentures    Constitutional Exam  General appearance: Well nourished, well developed, and well hydrated. In no apparent acute distress There were no vitals filed for this visit. BMI Assessment: Estimated body mass index is 35.57 kg/m as calculated from the following:   Height as of 09/22/22: 6' 5"$  (1.956 m).   Weight as of 09/22/22: 300 lb (136.1 kg).  BMI interpretation table: BMI level Category Range association with higher incidence of chronic pain  <18 kg/m2 Underweight   18.5-24.9 kg/m2 Ideal body weight   25-29.9 kg/m2 Overweight Increased incidence by 20%  30-34.9 kg/m2 Obese (Class I) Increased incidence by 68%  35-39.9 kg/m2 Severe obesity (Class II) Increased incidence by 136%  >40 kg/m2 Extreme obesity (Class III) Increased incidence by 254%   Patient's current BMI Ideal Body weight  There is no height or weight on file to calculate BMI. Patient weight not recorded   BMI Readings from Last 4 Encounters:  09/22/22 35.57 kg/m  08/30/17 33.70 kg/m  07/19/17 32.68 kg/m  06/24/17 30.94 kg/m   Wt Readings from Last 4 Encounters:  09/22/22 300 lb (136.1 kg)  08/30/17 284 lb 3.2 oz (128.9 kg)  07/19/17 275 lb 9.6 oz (125 kg)  06/24/17 260 lb 14.4 oz (118.3 kg)    Psych/Mental status: Alert, oriented x 3 (person, place, & time)       Eyes: PERLA Respiratory: No evidence of acute respiratory distress  Assessment  Primary Diagnosis & Pertinent Problem  List: There were no encounter diagnoses.  Visit Diagnosis (New problems to examiner): No diagnosis found. Plan of Care (Initial workup plan)  Note: Mr. Vantrease was reminded that as per protocol, today's visit has been an evaluation only. We have not taken over the patient's controlled substance management.  Problem-specific plan: No problem-specific Assessment & Plan notes found for this encounter. Lab Orders  No laboratory test(s) ordered today   Imaging Orders  No imaging studies ordered today   Referral Orders  No referral(s) requested today   Procedure Orders    No procedure(s) ordered today   Pharmacotherapy (current): Medications ordered:  No orders of the defined types were placed in this encounter.  Medications administered during this visit: Billyjack Bushee. [place] had no medications administered during this visit.   Analgesic Pharmacotherapy:  Opioid Analgesics: For patients currently taking or requesting to take opioid analgesics, in accordance with [place], we will assess their risks and indications for the use of these substances. After completing our evaluation, we may offer recommendations, but we no longer take patients for medication management. The prescribing physician will ultimately decide, based on his/her training and level of comfort whether to adopt any of the recommendations, including whether or not to prescribe such medicines.  Membrane stabilizer: To be determined at a later time  Muscle relaxant: To be determined at a later time  NSAID: To be determined at a later time  Other analgesic(s): To be determined at a later time   Interventional management options: Mr. Elzinga was informed that there is no guarantee that he would be a candidate for interventional therapies. The decision will be based on the results of diagnostic studies, as well as Mr. Bogie risk profile.  Procedure(s) under consideration:   Pending results of ordered studies      Interventional Therapies  Risk Factors  Considerations:     Planned  Pending:   See above for possible orders   Under consideration:   Pending completion of evaluation   Completed:   None at this time   Completed by other providers:   None at this time   Therapeutic  Palliative (PRN) options:   None established      Provider-requested follow-up: No follow-ups on file.  Future Appointments  Date Time Provider [place]  01/04/2023 10:00 AM Milinda Pointer, MD [facility] None    Duration of encounter: *** minutes.  Total time on encounter, as per AMA guidelines included both the face-to-face and non-face-to-face time personally spent by the physician and/or other qualified health care professional(s) on the day of the encounter (includes time in activities that require the physician or other qualified health care professional and does not include time in activities normally performed by clinical staff). Physician's time may include the following activities when performed: Preparing to see the patient (e.g., pre-charting review of records, searching for previously ordered imaging, lab work, and nerve conduction tests) Review of prior analgesic pharmacotherapies. Reviewing PMP Interpreting ordered tests (e.g., lab work, imaging, nerve conduction tests) Performing post-procedure evaluations, including interpretation of diagnostic procedures Obtaining and/or reviewing separately obtained history Performing a medically appropriate examination and/or evaluation Counseling and educating the patient/family/caregiver Ordering medications, tests, or procedures Referring and communicating with other health care professionals (when not separately reported) Documenting clinical information in the electronic or other health record Independently interpreting results (not separately reported) and communicating results to the patient/  family/caregiver Care coordination (not separately reported)  Note by: Gaspar Cola, MD Date: 01/04/2023; Time: 11:20 AM

## 2023-01-04 ENCOUNTER — Ambulatory Visit
Admission: RE | Admit: 2023-01-04 | Discharge: 2023-01-04 | Disposition: A | Discharge status: Home / Self Care | Payer: No Typology Code available for payment source | Source: Ambulatory Visit | Attending: Pain Medicine | Admitting: Pain Medicine

## 2023-01-04 ENCOUNTER — Ambulatory Visit: Payer: No Typology Code available for payment source | Attending: Pain Medicine | Admitting: Pain Medicine

## 2023-01-04 ENCOUNTER — Encounter: Payer: Self-pay | Admitting: Pain Medicine

## 2023-01-04 VITALS — BP 128/80 | HR 76 | Temp 97.3°F | Resp 16 | Ht 77.0 in | Wt 300.0 lb

## 2023-01-04 DIAGNOSIS — Z8572 Personal history of non-Hodgkin lymphomas: Secondary | ICD-10-CM | POA: Insufficient documentation

## 2023-01-04 DIAGNOSIS — Z789 Other specified health status: Secondary | ICD-10-CM

## 2023-01-04 DIAGNOSIS — G8929 Other chronic pain: Secondary | ICD-10-CM

## 2023-01-04 DIAGNOSIS — M79642 Pain in left hand: Secondary | ICD-10-CM

## 2023-01-04 DIAGNOSIS — M5459 Other low back pain: Secondary | ICD-10-CM

## 2023-01-04 DIAGNOSIS — M47816 Spondylosis without myelopathy or radiculopathy, lumbar region: Secondary | ICD-10-CM

## 2023-01-04 DIAGNOSIS — R079 Chest pain, unspecified: Secondary | ICD-10-CM | POA: Insufficient documentation

## 2023-01-04 DIAGNOSIS — C8330 Diffuse large B-cell lymphoma, unspecified site: Secondary | ICD-10-CM | POA: Insufficient documentation

## 2023-01-04 DIAGNOSIS — M25562 Pain in left knee: Secondary | ICD-10-CM

## 2023-01-04 DIAGNOSIS — Z79899 Other long term (current) drug therapy: Secondary | ICD-10-CM | POA: Diagnosis present

## 2023-01-04 DIAGNOSIS — Z8719 Personal history of other diseases of the digestive system: Secondary | ICD-10-CM | POA: Insufficient documentation

## 2023-01-04 DIAGNOSIS — C8510 Unspecified B-cell lymphoma, unspecified site: Secondary | ICD-10-CM | POA: Insufficient documentation

## 2023-01-04 DIAGNOSIS — G8928 Other chronic postprocedural pain: Secondary | ICD-10-CM

## 2023-01-04 DIAGNOSIS — I088 Other rheumatic multiple valve diseases: Secondary | ICD-10-CM | POA: Insufficient documentation

## 2023-01-04 DIAGNOSIS — Z7901 Long term (current) use of anticoagulants: Secondary | ICD-10-CM | POA: Insufficient documentation

## 2023-01-04 DIAGNOSIS — R937 Abnormal findings on diagnostic imaging of other parts of musculoskeletal system: Secondary | ICD-10-CM

## 2023-01-04 DIAGNOSIS — G894 Chronic pain syndrome: Secondary | ICD-10-CM | POA: Diagnosis not present

## 2023-01-04 DIAGNOSIS — Z96653 Presence of artificial knee joint, bilateral: Secondary | ICD-10-CM

## 2023-01-04 DIAGNOSIS — Z791 Long term (current) use of non-steroidal anti-inflammatories (NSAID): Secondary | ICD-10-CM

## 2023-01-04 DIAGNOSIS — Z7189 Other specified counseling: Secondary | ICD-10-CM | POA: Insufficient documentation

## 2023-01-04 DIAGNOSIS — Z01818 Encounter for other preprocedural examination: Secondary | ICD-10-CM | POA: Insufficient documentation

## 2023-01-04 DIAGNOSIS — Z903 Acquired absence of stomach [part of]: Secondary | ICD-10-CM | POA: Insufficient documentation

## 2023-01-04 DIAGNOSIS — Z1211 Encounter for screening for malignant neoplasm of colon: Secondary | ICD-10-CM | POA: Insufficient documentation

## 2023-01-04 DIAGNOSIS — M545 Low back pain, unspecified: Secondary | ICD-10-CM | POA: Insufficient documentation

## 2023-01-04 DIAGNOSIS — H9201 Otalgia, right ear: Secondary | ICD-10-CM | POA: Insufficient documentation

## 2023-01-04 DIAGNOSIS — I10 Essential (primary) hypertension: Secondary | ICD-10-CM | POA: Insufficient documentation

## 2023-01-04 DIAGNOSIS — N400 Enlarged prostate without lower urinary tract symptoms: Secondary | ICD-10-CM | POA: Insufficient documentation

## 2023-01-04 DIAGNOSIS — Z8679 Personal history of other diseases of the circulatory system: Secondary | ICD-10-CM | POA: Insufficient documentation

## 2023-01-04 DIAGNOSIS — I4891 Unspecified atrial fibrillation: Secondary | ICD-10-CM | POA: Insufficient documentation

## 2023-01-04 DIAGNOSIS — M25561 Pain in right knee: Secondary | ICD-10-CM

## 2023-01-04 DIAGNOSIS — M79641 Pain in right hand: Secondary | ICD-10-CM | POA: Diagnosis not present

## 2023-01-04 DIAGNOSIS — I471 Supraventricular tachycardia, unspecified: Secondary | ICD-10-CM | POA: Insufficient documentation

## 2023-01-04 DIAGNOSIS — M1712 Unilateral primary osteoarthritis, left knee: Secondary | ICD-10-CM | POA: Insufficient documentation

## 2023-01-04 DIAGNOSIS — M899 Disorder of bone, unspecified: Secondary | ICD-10-CM

## 2023-01-04 DIAGNOSIS — Z712 Person consulting for explanation of examination or test findings: Secondary | ICD-10-CM | POA: Insufficient documentation

## 2023-01-04 DIAGNOSIS — Z6838 Body mass index (BMI) 38.0-38.9, adult: Secondary | ICD-10-CM | POA: Insufficient documentation

## 2023-01-04 DIAGNOSIS — G4733 Obstructive sleep apnea (adult) (pediatric): Secondary | ICD-10-CM | POA: Insufficient documentation

## 2023-01-04 DIAGNOSIS — R0789 Other chest pain: Secondary | ICD-10-CM | POA: Insufficient documentation

## 2023-01-04 NOTE — Patient Instructions (Addendum)
____________________________________________________________________________________________  New Patients  Welcome to [facility].   Initial Visit The first or initial visit consists of an evaluation only.   Interventional pain management.  We offer therapies other than opioid controlled substances to manage chronic pain. These include, but are not limited to, diagnostic, therapeutic, and palliative specialized injection therapies (i.e.: Epidural Steroids, Facet Blocks, etc.). We specialize in a variety of nerve blocks as well as radiofrequency treatments. We offer pain implant evaluations and trials, as well as follow up management. In addition we also provide a variety joint injections, including Viscosupplementation (AKA: Gel Therapy).  Prescription Pain Medication We provide evaluations for/of pharmacologic therapies. Recommendations will follow CDC Guidelines.  We no longer take patients for long-term medication management. We will not be taking over your pain medications.  ____________________________________________________________________________________________    ____________________________________________________________________________________________  Patient Information update  To: All of our patients.  Re: Name change.  It has been made official that our current name, "[facility]"   will soon be changed to "[facility]".   The purpose of this change is to eliminate any confusion created by the concept of our practice being a "Medication Management Pain Clinic". In the past this has led to the misconception that we treat pain primarily by the use of prescription medications.  Nothing can be farther from the truth.   Understanding PAIN MANAGEMENT: To further understand what our practice does, you  first have to understand that "Pain Management" is a subspecialty that requires additional training once a physician has completed their specialty training, which can be in either Anesthesia, Neurology, Psychiatry, or Physical Medicine and Rehabilitation (PMR). Each one of these contributes to the final approach taken by each physician to the management of their patient's pain. To be a "Pain Management Specialist" you must have first completed one of the specialty trainings below.  Anesthesiologists - trained in clinical pharmacology and interventional techniques such as nerve blockade and regional as well as central neuroanatomy. They are trained to block pain before, during, and after surgical interventions.  Neurologists - trained in the diagnosis and pharmacological treatment of complex neurological conditions, such as Multiple Sclerosis, Parkinson's, spinal cord injuries, and other systemic conditions that may be associated with symptoms that may include but are not limited to pain. They tend to rely primarily on the treatment of chronic pain using prescription medications.  Psychiatrist - trained in conditions affecting the psychosocial wellbeing of patients including but not limited to depression, anxiety, schizophrenia, personality disorders, addiction, and other substance use disorders that may be associated with chronic pain. They tend to rely primarily on the treatment of chronic pain using prescription medications.   Physical Medicine and Rehabilitation (PMR) physicians, also known as physiatrists - trained to treat a wide variety of medical conditions affecting the brain, spinal cord, nerves, bones, joints, ligaments, muscles, and tendons. Their training is primarily aimed at treating patients that have suffered injuries that have caused severe physical impairment. Their training is primarily aimed at the physical therapy and rehabilitation of those patients. They may also work alongside  orthopedic surgeons or neurosurgeons using their expertise in assisting surgical patients to recover after their surgeries.  INTERVENTIONAL PAIN MANAGEMENT is sub-subspecialty of Pain Management.  Our physicians are Board-certified in Anesthesia, Pain Management, and Interventional Pain Management.  This meaning that not only have they been trained and Board-certified in their specialty of Anesthesia, and  subspecialty of Pain Management, but they have also received further training in the sub-subspecialty of Interventional Pain Management, in order to become Board-certified as INTERVENTIONAL PAIN MANAGEMENT SPECIALIST.    Mission: Our goal is to use our skills in  [place] as alternatives to the chronic use of prescription opioid medications for the treatment of pain. To make this more clear, we have changed our name to reflect what we do and offer. We will continue to offer medication management assessment and recommendations, but we will not be taking over any patient's medication management.  ____________________________________________________________________________________________     ____________________________________________________________________________________________  General Risks and Possible Complications  Patient Responsibilities: It is important that you read this as it is part of your informed consent. It is our duty to inform you of the risks and possible complications associated with treatments offered to you. It is your responsibility as a patient to read this and to ask questions about anything that is not clear or that you believe was not covered in this document.  Patient's Rights: You have the right to refuse treatment. You also have the right to change your mind, even after initially having agreed to have the treatment done. However, under this last option, if you wait until the last second to change your mind, you may be charged for the materials  used up to that point.  Introduction: Medicine is not an Chief Strategy Officer. Everything in Medicine, including the lack of treatment(s), carries the potential for danger, harm, or loss (which is by definition: Risk). In Medicine, a complication is a secondary problem, condition, or disease that can aggravate an already existing one. All treatments carry the risk of possible complications. The fact that a side effects or complications occurs, does not imply that the treatment was conducted incorrectly. It must be clearly understood that these can happen even when everything is done following the highest safety standards.  No treatment: You can choose not to proceed with the proposed treatment alternative. The "PRO(s)" would include: avoiding the risk of complications associated with the therapy. The "CON(s)" would include: not getting any of the treatment benefits. These benefits fall under one of three categories: diagnostic; therapeutic; and/or palliative. Diagnostic benefits include: getting information which can ultimately lead to improvement of the disease or symptom(s). Therapeutic benefits are those associated with the successful treatment of the disease. Finally, palliative benefits are those related to the decrease of the primary symptoms, without necessarily curing the condition (example: decreasing the pain from a flare-up of a chronic condition, such as incurable terminal cancer).  General Risks and Complications: These are associated to most interventional treatments. They can occur alone, or in combination. They fall under one of the following six (6) categories: no benefit or worsening of symptoms; bleeding; infection; nerve damage; allergic reactions; and/or death. No benefits or worsening of symptoms: In Medicine there are no guarantees, only probabilities. No healthcare provider can ever guarantee that a medical treatment will work, they can only state the probability that it may. Furthermore,  there is always the possibility that the condition may worsen, either directly, or indirectly, as a consequence of the treatment. Bleeding: This is more common if the patient is taking a blood thinner, either prescription or over the counter (example: Goody Powders, Fish oil, Aspirin, Garlic, etc.), or if suffering a condition associated with impaired coagulation (example: Hemophilia, cirrhosis of the liver, low platelet counts, etc.). However, even if you do not have one on these, it can still happen. If  you have any of these conditions, or take one of these drugs, make sure to notify your treating physician. Infection: This is more common in patients with a compromised immune system, either due to disease (example: diabetes, cancer, human immunodeficiency virus [HIV], etc.), or due to medications or treatments (example: therapies used to treat cancer and rheumatological diseases). However, even if you do not have one on these, it can still happen. If you have any of these conditions, or take one of these drugs, make sure to notify your treating physician. Nerve Damage: This is more common when the treatment is an invasive one, but it can also happen with the use of medications, such as those used in the treatment of cancer. The damage can occur to small secondary nerves, or to large primary ones, such as those in the spinal cord and brain. This damage may be temporary or permanent and it may lead to impairments that can range from temporary numbness to permanent paralysis and/or brain death. Allergic Reactions: Any time a substance or material comes in contact with our body, there is the possibility of an allergic reaction. These can range from a mild skin rash (contact dermatitis) to a severe systemic reaction (anaphylactic reaction), which can result in death. Death: In general, any medical intervention can result in death, most of the time due to an unforeseen  complication. ____________________________________________________________________________________________   ____________________________________________________________________________________________  Blood Thinners  IMPORTANT NOTICE:  If you take any of these, make sure to notify the nursing staff.  Failure to do so may result in injury.  Recommended time intervals to stop and restart blood-thinners, before & after invasive procedures  Generic Name Brand Name Pre-procedure. Stop this long before procedure. Post-procedure. Minimum waiting period before restarting.  Abciximab Reopro 15 days 2 hrs  Alteplase Activase 10 days 10 days  Anagrelide Agrylin    Apixaban Eliquis 3 days 6 hrs  Cilostazol Pletal 3 days 5 hrs  Clopidogrel Plavix 7-10 days 2 hrs  Dabigatran Pradaxa 5 days 6 hrs  Dalteparin Fragmin 24 hours 4 hrs  Dipyridamole Aggrenox 11days 2 hrs  Edoxaban Lixiana; Savaysa 3 days 2 hrs  Enoxaparin  Lovenox 24 hours 4 hrs  Eptifibatide Integrillin 8 hours 2 hrs  Fondaparinux  Arixtra 72 hours 12 hrs  Hydroxychloroquine Plaquenil 11 days   Prasugrel Effient 7-10 days 6 hrs  Reteplase Retavase 10 days 10 days  Rivaroxaban Xarelto 3 days 6 hrs  Ticagrelor Brilinta 5-7 days 6 hrs  Ticlopidine Ticlid 10-14 days 2 hrs  Tinzaparin Innohep 24 hours 4 hrs  Tirofiban Aggrastat 8 hours 2 hrs  Warfarin Coumadin 5 days 2 hrs   Other medications with blood-thinning effects  Product indications Generic (Brand) names Note  Cholesterol Lipitor Stop 4 days before procedure  Blood thinner (injectable) Heparin (LMW or LMWH Heparin) Stop 24 hours before procedure  Cancer Ibrutinib (Imbruvica) Stop 7 days before procedure  Malaria/Rheumatoid Hydroxychloroquine (Plaquenil) Stop 11 days before procedure  Thrombolytics  10 days before or after procedures   Over-the-counter (OTC) Products with blood-thinning effects  Product Common names Stop Time  Aspirin > 325 mg Goody Powders,  Excedrin, etc. 11 days  Aspirin ? 81 mg  7 days  Fish oil  4 days  Garlic supplements  7 days  Ginkgo biloba  36 hours  Ginseng  24 hours  NSAIDs Ibuprofen, Naprosyn, etc. 3 days  Vitamin E  4 days   ____________________________________________________________________________________________

## 2023-01-04 NOTE — Progress Notes (Signed)
Safety precautions to be maintained throughout the outpatient stay will include: orient to surroundings, keep bed in low position, maintain call bell within reach at all times, provide assistance with transfer out of bed and ambulation.  

## 2023-01-07 LAB — COMPLIANCE DRUG ANALYSIS, UR

## 2023-01-09 LAB — COMP. METABOLIC PANEL (12)
AST: 23 IU/L (ref 0–40)
Albumin/Globulin Ratio: 2.1 (ref 1.2–2.2)
Albumin: 4.6 g/dL (ref 3.9–4.9)
Alkaline Phosphatase: 94 IU/L (ref 44–121)
BUN/Creatinine Ratio: 19 (ref 10–24)
BUN: 20 mg/dL (ref 8–27)
Bilirubin Total: 0.5 mg/dL (ref 0.0–1.2)
Calcium: 9.6 mg/dL (ref 8.6–10.2)
Chloride: 103 mmol/L (ref 96–106)
Creatinine, Ser: 1.08 mg/dL (ref 0.76–1.27)
Globulin, Total: 2.2 g/dL (ref 1.5–4.5)
Glucose: 105 mg/dL — ABNORMAL HIGH (ref 70–99)
Potassium: 5 mmol/L (ref 3.5–5.2)
Sodium: 140 mmol/L (ref 134–144)
Total Protein: 6.8 g/dL (ref 6.0–8.5)
eGFR: 74 mL/min/{1.73_m2} (ref >59)

## 2023-01-09 LAB — 25-HYDROXY VITAMIN D LCMS D2+D3
25-Hydroxy, Vitamin D-2: <1 ng/mL
25-Hydroxy, Vitamin D-3: 35 ng/mL
25-Hydroxy, Vitamin D: 35 ng/mL

## 2023-01-09 LAB — VITAMIN B12: Vitamin B-12: 1258 pg/mL — ABNORMAL HIGH (ref 232–1245)

## 2023-01-09 LAB — SEDIMENTATION RATE: Sed Rate: 6 mm/hr (ref 0–30)

## 2023-01-09 LAB — MAGNESIUM: Magnesium: 2.1 mg/dL (ref 1.6–2.3)

## 2023-01-09 LAB — C-REACTIVE PROTEIN: CRP: 2 mg/L (ref 0–10)

## 2023-01-21 NOTE — Progress Notes (Deleted)
PROVIDER NOTE: Information contained herein reflects review and annotations entered in association with encounter. Interpretation of such information and data should be left to medically-trained personnel. Information provided to patient can be located elsewhere in the medical record under "Patient Instructions". Document created using STT-dictation technology, any transcriptional errors that may result from process are unintentional.    Patient: Nicholas Reynolds  Service Category: E/M  Provider: Gaspar Cola, MD  DOB: Feb 16, 1951  DOS: 01/25/2023  Referring Provider: [facility]  MRN: [id number]  Specialty: Interventional Pain Management  PCP: [facility]  Type: Established Patient  Setting: Ambulatory outpatient    Location: Office  Delivery: Face-to-face     Primary Reason(s) for Visit: Encounter for evaluation before starting new chronic pain management plan of care (Level of risk: moderate) CC: No chief complaint on file.  HPI  Nicholas Reynolds is a 72 y.o. year old, male patient, who comes today for a follow-up evaluation to review the test results and decide on a treatment plan. He has Atrial fibrillation ([place]); Varicose veins of lower extremity with edema; Fatigue; Obesity (BMI 30-39.9); Orthostatic dizziness; H/O ventricular tachycardia; Coronary artery disease, non-occlusive; Sleep apnea; Family history of premature coronary artery disease; Retroperitoneal mass; Chronic knee pain after total replacement (3ry area of Pain) (intermittent) (TKR) (Bilateral); Benign non-nodular prostatic hyperplasia with lower urinary tract symptoms; Chronic back pain; Osteoarthritis of lumbar spine; Gastroesophageal reflux disease; Sensorineural hearing loss, bilateral; Hypogonadism male; Morbid obesity ([place]); Palpitations; Peripheral vascular disease ([place]); Pigmented villonodular synovitis of left knee; Strain of left quadriceps tendon; Traumatic arthritis of knee, left; Trigger  finger of left thumb; Vasomotor rhinitis; Venous insufficiency; Nonsustained ventricular tachycardia ([place]); Goals of care, counseling/discussion; 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[place]); Arthritis of left knee; Atrial tachycardia; Near syncope; Osteoarthritis of knee; Hypotension; Rectal prolapse; Abdominal pain; Age-related nuclear cataract of both eyes; Ascending aorta dilatation ([place]); Bilateral hearing loss; Cataract; Cortical senile cataract of left eye; Disorder of refraction; Dry eye syndrome of bilateral lacrimal glands; Dry eye syndrome of unspecified lacrimal gland; Encounter for fitting and adjustment of hearing aid; History of cholecystectomy; History of total knee arthroplasty (Bilateral); Left ventricular hypertrophy; Personal history of non-Hodgkin lymphomas; Personal history of other malignant neoplasms of lymphoid, hematopoietic and related tissues; Presence of artificial knee joint, bilateral; Presence of intraocular lens; Radiculopathy, lumbar region; Screening due; Hx of RF ablation for complex left atrial arrhythmia; Benign prostatic hyperplasia without urinary obstruction; History of umbilical hernia repair; Chronic anticoagulation (Eliquis); Diffuse large B-cell lymphoma ([place]); Chronic low back pain (1ry area of Pain) (Midline) w/o sciatica; Cortical senile cataract of right eye; B-cell lymphoma ([place]); Other rheumatic multiple valve diseases; Body mass index (BMI) 38.0-38.9, adult; Osteoarthritis of left knee; History of B-cell lymphoma; Obstructive sleep apnea (adult) (pediatric); History of repair of inguinal hernia; Otalgia, right ear; Atypical chest pain; Chest pain, unspecified; Unspecified atrial fibrillation ([place]); Paroxysmal atrial fibrillation ([place]); Paroxysmal supraventricular tachycardia; Benign prostatic hyperplasia; Encounter for other preprocedural examination; Encounter for screening for malignant neoplasm of colon; Person consulting for  explanation of examination or test findings; Other specified counseling; History of gastrectomy; Essential (primary) hypertension; Chronic pain syndrome; Pharmacologic therapy; Disorder of skeletal system; Problems influencing health status; Chronic hand pain (2ry area of Pain) (Bilateral) (L>R); Long term current use of non-steroidal anti-inflammatories (NSAID); Lumbar facet joint syndrome; Lumbar facet joint pain; and Abnormal MRI, lumbar spine (09/29/2022[facility]. His primarily concern today is the No chief complaint on file.  Pain  Assessment: Location:     Radiating:   Onset:   Duration:   Quality:   Severity:  /10 (subjective, self-reported pain score)  Effect on ADL:   Timing:   Modifying factors:   BP:    HR:    Nicholas Reynolds comes in today for a follow-up visit after his initial evaluation on 01/04/2023. Today we went over the results of his tests. These were explained in "Layman's terms". During today's appointment we went over my diagnostic impression, as well as the proposed treatment plan.  ***  Patient presented with interventional treatment options. Nicholas Reynolds was informed that I will not be providing medication management. Pharmacotherapy evaluation including recommendations may be offered, if specifically requested.   Controlled Substance Pharmacotherapy Assessment REMS (Risk Evaluation and Mitigation Strategy)  Opioid Analgesic: None MME/day: 0 mg/day  Pill Count: None expected due to no prior prescriptions written by our practice. No notes on file Pharmacokinetics: Liberation and absorption (onset of action): WNL Distribution (time to peak effect): WNL Metabolism and excretion (duration of action): WNL         Pharmacodynamics: Desired effects: Analgesia: Nicholas Reynolds reports >50% benefit. Functional ability: Patient reports that medication allows him to accomplish basic ADLs Clinically meaningful improvement in function (CMIF): Sustained  CMIF goals met Perceived effectiveness: Described as relatively effective, allowing for increase in activities of daily living (ADL) Undesirable effects: Side-effects or Adverse reactions: None reported Monitoring: [place] PMP: PDMP reviewed during this encounter. Online review of the past 65-monthperiod previously conducted. Not applicable at this point since we have not taken over the patient's medication management yet. List of other Serum/Urine Drug Screening Test(s):  Lab Results  Component Value Date   COCAINSCRNUR NEGATIVE 06/26/2014   [place] 06/26/2014   List of all UDS test(s) done:  Lab Results  Component Value Date   SUMMARY Note 01/04/2023   Last UDS on record: Summary  Date Value Ref Range Status  01/04/2023 Note  Final    Comment:    ==================================================================== Compliance Drug Analysis, Ur ==================================================================== Test                             Result       Flag       Units  Drug Present and Declared for Prescription Verification   Tramadol                       1271         EXPECTED   ng/mg creat   O-Desmethyltramadol            2246         EXPECTED   ng/mg creat   N-Desmethyltramadol            545          EXPECTED   ng/mg creat    Source of tramadol is a prescription medication. O-desmethyltramadol    and N-desmethyltramadol are expected metabolites of tramadol.    Pregabalin                     PRESENT      EXPECTED   Ibuprofen                      PRESENT      EXPECTED   Metoprolol  PRESENT      EXPECTED  Drug Present not Declared for Prescription Verification   Gabapentin                     PRESENT      UNEXPECTED   Acetaminophen                  PRESENT      UNEXPECTED   Diphenhydramine                PRESENT      UNEXPECTED   Dextrorphan/Levorphanol        PRESENT      UNEXPECTED    Dextrorphan is an expected metabolite of dextromethorphan,  an over-    the-counter or prescription cough suppressant. Levorphanol is a    scheduled prescription medication. Dextrorphan cannot be    distinguished from levorphanol by the method used for analysis.  ==================================================================== Test                      Result    Flag   Units      Ref Range   Creatinine              110              mg/dL      >=20 ==================================================================== Declared Medications:  The flagging and interpretation on this report are based on the  following declared medications.  Unexpected results may arise from  inaccuracies in the declared medications.   **Note: The testing scope of this panel includes these medications:   Metoprolol  Pregabalin (Lyrica)  Tramadol (Ultram)   **Note: The testing scope of this panel does not include small to  moderate amounts of these reported medications:   Ibuprofen   **Note: The testing scope of this panel does not include the  following reported medications:   Apixaban (Eliquis)  Cholecalciferol  Dabigatran (Pradaxa)  Eye Drop  Finasteride (Proscar)  Magnesium (Magonate)  Multivitamin  Tamsulosin (Flomax) ==================================================================== For clinical consultation, please call [phone]. ====================================================================    UDS interpretation: No unexpected findings.          Medication Assessment Form: Not applicable. No opioids. Treatment compliance: Not applicable Risk Assessment Profile: Aberrant behavior: See initial evaluations. None observed or detected today Comorbid factors increasing risk of overdose: See initial evaluation. No additional risks detected today Opioid risk tool (ORT):     01/04/2023    9:55 AM  Opioid Risk   Alcohol 0  Illegal Drugs 0  Rx Drugs 0  Alcohol 0  Illegal Drugs 0  Rx Drugs 0  Psychological Disease 0  Depression 0   Opioid Risk Tool Scoring 0  Opioid Risk Interpretation Low Risk    ORT Scoring interpretation table:  Score <3 = Low Risk for SUD  Score between 4-7 = Moderate Risk for SUD  Score >8 = High Risk for Opioid Abuse   Risk of substance use disorder (SUD): Low  Risk Mitigation Strategies:  Patient opioid safety counseling: No controlled substances prescribed. Patient-Prescriber Agreement (PPA): No agreement signed.  Controlled substance notification to other providers: None required. No opioid therapy.  Pharmacologic Plan: Non-opioid analgesic therapy offered. Interventional alternatives discussed.             Laboratory Chemistry Profile   Renal Lab Results  Component Value Date   BUN 20 01/04/2023   CREATININE 1.08 01/04/2023   BCR 19 01/04/2023   GFRAA >  60 08/30/2017   GFRNONAA >60 05/31/2022   PROTEINUR NEGATIVE 06/16/2017     Electrolytes Lab Results  Component Value Date   NA 140 01/04/2023   K 5.0 01/04/2023   CL 103 01/04/2023   CALCIUM 9.6 01/04/2023   MG 2.1 01/04/2023     Hepatic Lab Results  Component Value Date   AST 23 01/04/2023   ALT 23 08/30/2017   ALBUMIN 4.6 01/04/2023   ALKPHOS 94 01/04/2023   LIPASE 24 06/16/2017     ID Lab Results  Component Value Date   HIV Non Reactive 06/16/2017     Bone Lab Results  Component Value Date   25OHVITD1 35 01/04/2023   25OHVITD2 <1.0 01/04/2023   25OHVITD3 35 01/04/2023     Endocrine Lab Results  Component Value Date   GLUCOSE 105 (H) 01/04/2023   GLUCOSEU NEGATIVE 06/16/2017   TSH 1.495 03/04/2017     Neuropathy Lab Results  Component Value Date   VITAMINB12 1,258 (H) 01/04/2023   HIV Non Reactive 06/16/2017     CNS No results found for: "COLORCSF", "APPEARCSF", "RBCCOUNTCSF", "WBCCSF", "POLYSCSF", "LYMPHSCSF", "EOSCSF", "PROTEINCSF", "GLUCCSF", "JCVIRUS", "CSFOLI", "IGGCSF", "LABACHR", "ACETBL"   Inflammation (CRP: Acute  ESR: Chronic) Lab Results  Component Value Date   CRP 2  01/04/2023   ESRSEDRATE 6 01/04/2023   LATICACIDVEN 1.1 06/17/2017     Rheumatology No results found for: "RF", "ANA", "LABURIC", "URICUR", "LYMEIGGIGMAB", "LYMEABIGMQN", "HLAB27"   Coagulation Lab Results  Component Value Date   INR 2.93 07/19/2017   LABPROT 31.2 (H) 07/19/2017   APTT 40 (H) 03/17/2017   PLT 211 05/31/2022     Cardiovascular Lab Results  Component Value Date   CKTOTAL 294 06/26/2014   CKMB 3.0 06/27/2014   TROPONINI <0.03 06/17/2017   HGB 12.3 (L) 05/31/2022   HCT 38.3 (L) 05/31/2022     Screening Lab Results  Component Value Date   HIV Non Reactive 06/16/2017     Cancer No results found for: "CEA", "CA125", "LABCA2"   Allergens No results found for: "ALMOND", "APPLE", "ASPARAGUS", "AVOCADO", "BANANA", "BARLEY", "BASIL", "BAYLEAF", "GREENBEAN", "LIMABEAN", "WHITEBEAN", "BEEFIGE", "REDBEET", "BLUEBERRY", "BROCCOLI", "CABBAGE", "MELON", "CARROT", "CASEIN", "CASHEWNUT", "CAULIFLOWER", "CELERY"     Note: Lab results reviewed.  Recent Diagnostic Imaging Review  Cervical Imaging: Cervical MR wo contrast: No results found for this or any previous visit.  Cervical MR wo contrast: No valid procedures specified. Cervical CT wo contrast: No results found for this or any previous visit.  Cervical DG Bending/F/E views: No results found for this or any previous visit.   Shoulder Imaging: Shoulder-R MR wo contrast: No results found for this or any previous visit.  Shoulder-L MR wo contrast: No results found for this or any previous visit.  Shoulder-R DG: No results found for this or any previous visit.  Shoulder-L DG: No results found for this or any previous visit.   Thoracic Imaging: Thoracic MR wo contrast: No results found for this or any previous visit.  Thoracic MR wo contrast: No valid procedures specified. Thoracic CT wo contrast: No results found for this or any previous visit.  Thoracic DG 4 views: No results found for this or any previous  visit.  Thoracic DG w/swimmers view: No results found for this or any previous visit.   Lumbosacral Imaging: Lumbar MR wo contrast: No results found for this or any previous visit.  Lumbar MR wo contrast: No valid procedures specified. Lumbar CT wo contrast: No results found for this or any previous visit.  Lumbar DG Bending views: Results for orders placed during the hospital encounter of 01/04/23  DG Lumbar Spine Complete W/Bend  Narrative CLINICAL DATA:  Chronic midline low back pain without sciatica.  EXAM: LUMBAR SPINE - COMPLETE WITH BENDING VIEWS  COMPARISON:  Reformats from abdominopelvic CT 06/16/2017  FINDINGS: There are 5 non-rib-bearing lumbar vertebra. Slight straightening of normal lordosis. Dextroscoliotic curvature centered at the thoracolumbar junction. Trace retrolisthesis of L4 on L5 that is unchanged on flexion and extension. No abnormal motion.  Vertebral body heights are normal. No evidence of fracture, focal bone lesion or bony destructive change. No visible pars defects.  Moderate diffuse degenerative disc disease with disc space narrowing and anterior spurring. There is mild facet hypertrophy at L4-L5 and L5-S1. The sacroiliac joints are congruent  IMPRESSION: 1. Mild dextroscoliotic curvature centered at the thoracolumbar junction. 2. Moderate diffuse degenerative disc disease. Mild facet hypertrophy at L4-L5 and L5-S1. 3. Trace retrolisthesis of L4 on L5, unchanged on flexion and extension.   Electronically Signed By: Keith Rake M.D. On: 01/05/2023 10:54         Sacroiliac Joint Imaging: Sacroiliac Joint DG: No results found for this or any previous visit.   Hip Imaging: Hip-R MR wo contrast: No results found for this or any previous visit.  Hip-L MR wo contrast: No results found for this or any previous visit.  Hip-R CT wo contrast: No results found for this or any previous visit.  Hip-L CT wo contrast: No results found  for this or any previous visit.  Hip-R DG 2-3 views: No results found for this or any previous visit.  Hip-L DG 2-3 views: No results found for this or any previous visit.  Hip-B DG Bilateral: No results found for this or any previous visit.   Knee Imaging: Knee-R MR wo contrast: No results found for this or any previous visit.  Knee-L MR wo contrast: No results found for this or any previous visit.  Knee-R CT wo contrast: No results found for this or any previous visit.  Knee-L CT wo contrast: No results found for this or any previous visit.  Knee-R DG 4 views: No results found for this or any previous visit.  Knee-L DG 4 views: No results found for this or any previous visit.   Ankle Imaging: Ankle-R DG Complete: No results found for this or any previous visit.  Ankle-L DG Complete: No results found for this or any previous visit.   Foot Imaging: Foot-R DG Complete: No results found for this or any previous visit.  Foot-L DG Complete: No results found for this or any previous visit.   Elbow Imaging: Elbow-R DG Complete: No results found for this or any previous visit.  Elbow-L DG Complete: No results found for this or any previous visit.   Wrist Imaging: Wrist-R DG Complete: No results found for this or any previous visit.  Wrist-L DG Complete: No results found for this or any previous visit.   Hand Imaging: Hand-R DG Complete: No results found for this or any previous visit.  Hand-L DG Complete: No results found for this or any previous visit.   Complexity Note: Imaging results reviewed.                         Meds   Current Outpatient Medications:    apixaban (ELIQUIS) 5 MG TABS tablet, TAKE ONE TABLET BY MOUTH EVERY 12 HOURS CAUTION BLOOD THINNER, Disp: , Rfl:  Cholecalciferol (VITAMIN D3) 250 MCG (10000 UT) capsule, Take 10,000 Units by mouth daily., Disp: , Rfl:    dabigatran (PRADAXA) 150 MG CAPS capsule, Take by mouth., Disp: , Rfl:     dorzolamide-timolol (COSOPT) 2-0.5 % ophthalmic solution, Apply to eye., Disp: , Rfl:    finasteride (PROSCAR) 5 MG tablet, TAKE ONE TABLET BY MOUTH AT BEDTIME FOR ENLARGED PROSTATE, Disp: , Rfl:    Ibuprofen 200 MG CAPS, Take 4 capsules every 6 hours by oral route as needed., Disp: , Rfl:    Lifitegrast 5 % SOLN, INSTILL 1 DROP IN [facility] EYE TWO TIMES A DAY FOR DRY EYE, Disp: , Rfl:    magnesium gluconate (MAGONATE) 500 MG tablet, Take 500 mg by mouth 2 (two) times daily., Disp: , Rfl:    metoprolol tartrate (LOPRESSOR) 50 MG tablet, Take 1 tablet twice a day by oral route., Disp: , Rfl:    moxifloxacin (VIGAMOX) 0.5 % ophthalmic solution, 1 gtt QID in op eye 3 days prior to operation and 1 gtt morning of operation OD July 1, OS July 13, Disp: , Rfl:    Multiple Vitamin (MULTI-VITAMIN) tablet, Take 1 tablet by mouth daily., Disp: , Rfl:    Multiple Vitamin (ONE-A-DAY MENS PO), Take by mouth., Disp: , Rfl:    pregabalin (LYRICA) 50 MG capsule, Take 1 capsule by mouth 2 (two) times daily., Disp: , Rfl:    REFRESH LIQUIGEL 1 % GEL, Apply to eye., Disp: , Rfl:    tamsulosin (FLOMAX) 0.4 MG CAPS capsule, Take 0.4 mg by mouth daily. PM, Disp: , Rfl:    traMADol (ULTRAM) 50 MG tablet, TAKE 1 TABLET BY MOUTH TWO TIMES A DAY AS NEEDED FOR CHRONIC PAIN, Disp: , Rfl:  No current facility-administered medications for this visit.  Facility-Administered Medications Ordered in Other Visits:    sodium chloride flush (NS) 0.9 % injection 10 mL, 10 mL, Intravenous, PRN, Grayland Ormond, Kathlene November, MD, 10 mL at 07/19/17 1050  ROS  Constitutional: Denies any fever or chills Gastrointestinal: No reported hemesis, hematochezia, vomiting, or acute GI distress Musculoskeletal: Denies any acute onset joint swelling, redness, loss of ROM, or weakness Neurological: No reported episodes of acute onset apraxia, aphasia, dysarthria, agnosia, amnesia, paralysis, loss of coordination, or loss of consciousness  Allergies  Mr.  Neugebauer has No Known Allergies.  PFSH  Drug: Mr. Hasman  reports no history of drug use. Alcohol:  reports no history of alcohol use. Tobacco:  reports that he quit smoking about 23 years ago. His smoking use included cigarettes. He has never used smokeless tobacco. Medical:  has a past medical history of Arthritis, Asymptomatic varicose veins of bilateral lower extremities, Cancer ([place]), Complication of anesthesia, Coronary artery disease, non-occlusive (July 2008), Family history of premature coronary artery disease, GERD (gastroesophageal reflux disease), H/O ventricular tachycardia (05/2007), History of gastric restrictive surgery (December 2040), Obstructive sleep apnea, Paroxysmal atrial fibrillation ([place]) (2002), Pre-syncope (May 2015), Uses hearing aid, and Wears dentures. Surgical: Mr. Webb  has a past surgical history that includes Laparoscopic gastric sleeve resection; RADIOFREQUENCY ABLATION of Ventricular Tachycardia (July 2000 the); transthoracic echocardiogram (May 2015); Cardiac catheterization (2008); Event monitor (May 2015); [employer] (June 28, 2014); Rotator cuff repair; Cholecystectomy; Hernia repair; Elbow surgery; Knee arthroscopy (Left, 06/28/2015); and PORTA CATH INSERTION (N/A, 03/15/2017). Family: family history includes Arrhythmia in his father; Bone cancer in his mother; Breast cancer in his mother; COPD in his father; Heart attack in his father; Heart failure in his father.  Constitutional Exam  General appearance: Well nourished, well developed, and well hydrated. In no apparent acute distress There were no vitals filed for this visit. BMI Assessment: Estimated body mass index is 35.57 kg/m as calculated from the following:   Height as of 01/04/23: '6\' 5"'$  (1.956 m).   Weight as of 01/04/23: 300 lb (136.1 kg).  BMI interpretation table: BMI level Category Range association with higher incidence of chronic pain  <18 kg/m2 Underweight   18.5-24.9 kg/m2  Ideal body weight   25-29.9 kg/m2 Overweight Increased incidence by 20%  30-34.9 kg/m2 Obese (Class I) Increased incidence by 68%  35-39.9 kg/m2 Severe obesity (Class II) Increased incidence by 136%  >40 kg/m2 Extreme obesity (Class III) Increased incidence by 254%   Patient's current BMI Ideal Body weight  There is no height or weight on file to calculate BMI. Patient weight not recorded   BMI Readings from Last 4 Encounters:  01/04/23 35.57 kg/m  09/22/22 35.57 kg/m  08/30/17 33.70 kg/m  07/19/17 32.68 kg/m   Wt Readings from Last 4 Encounters:  01/04/23 300 lb (136.1 kg)  09/22/22 300 lb (136.1 kg)  08/30/17 284 lb 3.2 oz (128.9 kg)  07/19/17 275 lb 9.6 oz (125 kg)    Psych/Mental status: Alert, oriented x 3 (person, place, & time)       Eyes: PERLA Respiratory: No evidence of acute respiratory distress  Assessment & Plan  Primary Diagnosis & Pertinent Problem List: There were no encounter diagnoses.  Visit Diagnosis: No diagnosis found. Problems updated and reviewed during this visit: No problems updated.  Plan of Care  Pharmacotherapy (Medications Ordered): No orders of the defined types were placed in this encounter.  Procedure Orders    No procedure(s) ordered today   Lab Orders  No laboratory test(s) ordered today   Imaging Orders  No imaging studies ordered today   Referral Orders  No referral(s) requested today    Pharmacological management:  Opioid Analgesics: I will not be prescribing any opioids at this time Membrane stabilizer: I will not be prescribing any at this time Muscle relaxant: I will not be prescribing any at this time NSAID: I will not be prescribing any at this time Other analgesic(s): I will not be prescribing any at this time      Interventional Therapies  Risk Factors  Considerations:     Planned  Pending:   See above for possible orders   Under consideration:   Pending completion of evaluation   Completed:    None at this time   Completed by other providers:   None at this time   Therapeutic  Palliative (PRN) options:   None established         Provider-requested follow-up: No follow-ups on file. Recent Visits Date Type Provider Dept  01/04/23 Office Visit Milinda Pointer, MD [facility]  Showing recent visits within past 90 days and meeting all other requirements Future Appointments Date Type Provider Dept  01/25/23 Appointment Milinda Pointer, MD [facility]  Showing future appointments within next 90 days and meeting all other requirements   Primary Care Physician: [facility], [facility]  Duration of encounter: *** minutes.  Total time on encounter, as per AMA guidelines included both the face-to-face and non-face-to-face time personally spent by the physician and/or other qualified health care professional(s) on the day of the encounter (includes time in activities that require the physician or other qualified health care professional and does not include time in activities normally performed by  clinical staff). Physician's time may include the following activities when performed: Preparing to see the patient (e.g., pre-charting review of records, searching for previously ordered imaging, lab work, and nerve conduction tests) Review of prior analgesic pharmacotherapies. Reviewing PMP Interpreting ordered tests (e.g., lab work, imaging, nerve conduction tests) Performing post-procedure evaluations, including interpretation of diagnostic procedures Obtaining and/or reviewing separately obtained history Performing a medically appropriate examination and/or evaluation Counseling and educating the patient/family/caregiver Ordering medications, tests, or procedures Referring and communicating with other health care professionals (when not separately reported) Documenting clinical information in the electronic or other health record Independently  interpreting results (not separately reported) and communicating results to the patient/ family/caregiver Care coordination (not separately reported)  Note by: Gaspar Cola, MD (TTS technology used. I apologize for any typographical errors that were not detected and corrected.) Date: 01/25/2023; Time: 7:30 AM

## 2023-01-25 ENCOUNTER — Ambulatory Visit: Payer: No Typology Code available for payment source | Admitting: Pain Medicine

## 2023-06-24 DEATH — deceased
